# Patient Record
Sex: Female | Born: 1980 | Race: Black or African American | Hispanic: No | Marital: Single | Smoking: Former smoker
Health system: Southern US, Community
[De-identification: ages and names within clinical notes are randomized; demographics above are authoritative.]

## PROBLEM LIST (undated history)

## (undated) DIAGNOSIS — E119 Type 2 diabetes mellitus without complications: Secondary | ICD-10-CM

## (undated) DIAGNOSIS — C539 Malignant neoplasm of cervix uteri, unspecified: Secondary | ICD-10-CM

## (undated) DIAGNOSIS — K219 Gastro-esophageal reflux disease without esophagitis: Secondary | ICD-10-CM

## (undated) DIAGNOSIS — G6181 Chronic inflammatory demyelinating polyneuritis: Secondary | ICD-10-CM

## (undated) DIAGNOSIS — K449 Diaphragmatic hernia without obstruction or gangrene: Secondary | ICD-10-CM

## (undated) DIAGNOSIS — G8929 Other chronic pain: Secondary | ICD-10-CM

## (undated) DIAGNOSIS — K5790 Diverticulosis of intestine, part unspecified, without perforation or abscess without bleeding: Secondary | ICD-10-CM

## (undated) DIAGNOSIS — I639 Cerebral infarction, unspecified: Secondary | ICD-10-CM

## (undated) DIAGNOSIS — G43109 Migraine with aura, not intractable, without status migrainosus: Secondary | ICD-10-CM

## (undated) DIAGNOSIS — F6810 Factitious disorder, unspecified: Secondary | ICD-10-CM

## (undated) DIAGNOSIS — G40909 Epilepsy, unspecified, not intractable, without status epilepticus: Secondary | ICD-10-CM

## (undated) DIAGNOSIS — N83209 Unspecified ovarian cyst, unspecified side: Secondary | ICD-10-CM

## (undated) DIAGNOSIS — R569 Unspecified convulsions: Secondary | ICD-10-CM

## (undated) DIAGNOSIS — D571 Sickle-cell disease without crisis: Secondary | ICD-10-CM

## (undated) DIAGNOSIS — Z8639 Personal history of other endocrine, nutritional and metabolic disease: Secondary | ICD-10-CM

## (undated) HISTORY — PX: ROUX-EN-Y GASTRIC BYPASS: SHX1104

## (undated) HISTORY — DX: Factitious disorder imposed on self, unspecified: F68.10

## (undated) HISTORY — DX: Malignant neoplasm of cervix uteri, unspecified: C53.9

## (undated) HISTORY — DX: Diverticulosis of intestine, part unspecified, without perforation or abscess without bleeding: K57.90

## (undated) HISTORY — DX: Chronic inflammatory demyelinating polyneuritis: G61.81

## (undated) HISTORY — DX: Other chronic pain: G89.29

## (undated) HISTORY — DX: Diaphragmatic hernia without obstruction or gangrene: K44.9

## (undated) HISTORY — DX: Unspecified ovarian cyst, unspecified side: N83.209

## (undated) HISTORY — DX: Personal history of other endocrine, nutritional and metabolic disease: Z86.39

## (undated) HISTORY — DX: Migraine with aura, not intractable, without status migrainosus: G43.109

## (undated) HISTORY — DX: Gastro-esophageal reflux disease without esophagitis: K21.9

## (undated) HISTORY — DX: Epilepsy, unspecified, not intractable, without status epilepticus: G40.909

---

## 2006-09-20 HISTORY — PX: OOPHORECTOMY: SHX86

## 2008-09-20 DIAGNOSIS — Z8639 Personal history of other endocrine, nutritional and metabolic disease: Secondary | ICD-10-CM

## 2008-09-20 HISTORY — DX: Personal history of other endocrine, nutritional and metabolic disease: Z86.39

## 2010-09-20 HISTORY — PX: NISSEN FUNDOPLICATION: SHX2091

## 2012-09-20 DIAGNOSIS — G43109 Migraine with aura, not intractable, without status migrainosus: Secondary | ICD-10-CM

## 2012-09-20 HISTORY — DX: Migraine with aura, not intractable, without status migrainosus: G43.109

## 2013-09-20 DIAGNOSIS — C539 Malignant neoplasm of cervix uteri, unspecified: Secondary | ICD-10-CM

## 2013-09-20 HISTORY — DX: Malignant neoplasm of cervix uteri, unspecified: C53.9

## 2013-09-20 HISTORY — PX: ABDOMINAL HYSTERECTOMY: SHX81

## 2013-09-20 HISTORY — PX: VAGINAL HYSTERECTOMY: SUR661

## 2015-01-13 HISTORY — PX: ESOPHAGOGASTRODUODENOSCOPY: SHX1529

## 2015-12-31 ENCOUNTER — Encounter: Payer: Self-pay | Admitting: Internal Medicine

## 2015-12-31 ENCOUNTER — Ambulatory Visit (INDEPENDENT_AMBULATORY_CARE_PROVIDER_SITE_OTHER): Payer: Self-pay | Admitting: Internal Medicine

## 2015-12-31 VITALS — BP 120/72 | HR 76 | Resp 16 | Ht 60.0 in | Wt 129.0 lb

## 2015-12-31 DIAGNOSIS — R110 Nausea: Secondary | ICD-10-CM

## 2015-12-31 DIAGNOSIS — R109 Unspecified abdominal pain: Secondary | ICD-10-CM

## 2015-12-31 MED ORDER — PROMETHAZINE HCL 12.5 MG PO TABS: ORAL_TABLET | ORAL | Status: DC

## 2015-12-31 MED ORDER — TRAMADOL HCL 50 MG PO TABS: ORAL_TABLET | ORAL | Status: DC

## 2015-12-31 NOTE — Progress Notes (Signed)
Subjective:    Patient ID: Stacy Jackson, female    DOB: 03-14-1981, 35 y.o.   MRN: [id number]  HPI   New patient to establish, previously lived in [place], where her abdominal complaints started.  Stacy Jackson, Dr. Heather Jackson was her provider for this.  Intermittent right sided abdominal pain:  Problem for 18 months.  No change in frequency and severity with time.  Relates the pain to what she states are 4  tumors in her abdomen. Stats always has nausea, but at times, much worse.  Describes bruised, sore area.  Can have shooting pains into shoulder and through to back.  Reportedly, Gallbladder was not felt to be the cause of her symptoms. Has had weight loss of about 20 lbs since the abdominal pain started (pt. Actually relates she lost weight from 220 lbs starting 2010, but that was planned.)  2003 and 2008:  Cesarian Sections 2008:  Had right ovary with benign cyst removed. 0000000:  Nissen Fundoplication for Hiatal Hernia repair. March 2014, underwent TAH for cervical cancer unresponsive to LEEP.    Right upper quadrant pain began October of 2015 with decreased appetite and abdominal bloating.  Was evaluated with ultrasound and several studies.  Led to being seen by gastroenterologist, Dr. Luvenia Jackson having UGI with small bowel follow through and found to have the 4 tumors.  She is not certain where the tumors are actually located.  Her last visit with Dr. Loanne Jackson was June of 2016.   Pt. Subsequently moved from [place], [place] to [place] to be closer to family.  Did not have insurance upon moving, so has not continued the evaluation.  Eats once daily as does not have an appetite.  When eats, pain and nausea are worse.  Does not drink a lot--maybe drinks 2 bottles daily.  Took Tramadol at one point, but has not had any since August. Past Medical History  Diagnosis Date  . Hiatal hernia with gastroesophageal reflux 0000000    Fundoplication in 0000000  . Ovarian cyst right    2008  . Cervical cancer ([place]) 2015    Unsuccessful LEEP, then TAH  . History of obesity 2010  . 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 Complicated migraine [id number]    Left arm numbness, states she may have had a "mini-stroke"   Past Surgical History  Procedure Laterality Date  . Abdominal hysterectomy  2015    For cervical cancer unresponsive to LEEP  . Oophorectomy Right 2008    Done at same time of Csection  . Cesarean section  2003, 2008  . Nissen fundoplication  0000000   Social History   Social History  . Marital Status: Single    Spouse Name: N/A  . Number of Children: 2  . Years of Education: 16   Occupational History  . Operations at a golf [employer]    Social History Main Topics  . Smoking status: Never Smoker   . Smokeless tobacco: Never Used  . Alcohol Use: No  . Drug Use: No  . Sexual Activity: Not Currently   Other Topics Concern  . Not on file   Social History Narrative   Lives by herself   Children have different fathers--both involved in her children's lives   Children live in [place] with her parents.   Discusses her children needed to get set up with school when home fell through in [place], so stayed  with her parents until she could get her feet under her --eventually moved to [facility] to be closer to them [facility])      Family History  Problem Relation Age of Onset  . Renal cancer Mother   . Hypertension Mother   . Ulcerative colitis Father   . Diabetes Father   . Hypertension Father   . Diabetes Brother   . Hypertension Brother   . Asthma Brother   . Sickle cell trait Daughter   . Eczema Son   . Autism spectrum disorder Son   . Asthma Brother   . Eczema Brother         Review of Systems  Gastrointestinal: Positive for nausea and constipation. Negative for vomiting and blood in stool.       No melena   No current outpatient  prescriptions on file.  No Known Allergies    Objective:   Physical Exam  NAD HEENT:  PERRL, EOMI, TMs pearly gray, throat without injection, MMM Neck:  Supple, No adenopathy or thyromegaly Chest:  CTA CV:  RRR with normal S1 and S2, No S3, S4 or murmur, radial and DP pulses normal and equal Abd:  S, NT, No HSM or mass appreciated, +BS throughout, lot of loose skin and stretch marks across abdomen        Assessment & Plan:  1.  Abdominal pain with possible tumors:  Need records from primary care and GI specialist in [place].  Pt. Signed release.Marland Kitchen

## 2015-12-31 NOTE — Patient Instructions (Signed)
Call if you do not hear from the office in 2 weeks--to see if we have received your records

## 2016-01-01 ENCOUNTER — Telehealth: Payer: Self-pay | Admitting: Internal Medicine

## 2016-01-01 NOTE — Telephone Encounter (Signed)
Patient called today stating she was unable to get medicaton traMADol (ULTRAM) 50 MG tablet  At [facility] because they do not take the [employer]. [facility] on [address] and pharmacist states traMADol (ULTRAM) 50 MG tablet  For a quantity of 30 is $4.00. Patient informed and will go get medication

## 2016-01-24 ENCOUNTER — Encounter: Payer: Self-pay | Admitting: Internal Medicine

## 2016-01-26 ENCOUNTER — Telehealth: Payer: Self-pay | Admitting: Internal Medicine

## 2016-01-26 NOTE — Telephone Encounter (Signed)
Patient called inquiring if medical records received from Dr. Heather Roberts - [address] - [place], [place].  Patient can be reached at [phone].

## 2016-02-17 ENCOUNTER — Encounter: Payer: Self-pay | Admitting: Internal Medicine

## 2016-02-17 NOTE — Telephone Encounter (Signed)
Called patient to let her know we did receive her records from previous GI provider, but did not include CT scan ordered at her last visit in June of 2016.   Call placed into Dr. Cordelia Pen office to obtain the CT scan reading. Also, noted she had a Roux en Y surgery sometime prior to her visit with him and need that history from her as well.  Sounds like it was done as a bariatric procedure.

## 2016-02-27 NOTE — Telephone Encounter (Signed)
Records reveived

## 2016-02-27 NOTE — Telephone Encounter (Signed)
Did we ever get a reply back to receive a copy of the CT report from Dr. Cordelia Pen office?  Have not seen yet.

## 2016-03-01 ENCOUNTER — Ambulatory Visit (INDEPENDENT_AMBULATORY_CARE_PROVIDER_SITE_OTHER): Payer: Self-pay | Admitting: Internal Medicine

## 2016-03-01 ENCOUNTER — Encounter: Payer: Self-pay | Admitting: Internal Medicine

## 2016-03-01 VITALS — BP 112/62 | HR 76 | Temp 98.1°F | Resp 16 | Ht 60.0 in | Wt 124.0 lb

## 2016-03-01 DIAGNOSIS — Z8639 Personal history of other endocrine, nutritional and metabolic disease: Secondary | ICD-10-CM

## 2016-03-01 DIAGNOSIS — R1031 Right lower quadrant pain: Secondary | ICD-10-CM

## 2016-03-01 LAB — GLUCOSE, POCT (MANUAL RESULT ENTRY): POC Glucose: 56 mg/dl — AB (ref 70–99)

## 2016-03-01 MED ORDER — HYDROCODONE-ACETAMINOPHEN 5-325 MG PO TABS: ORAL_TABLET | ORAL | Status: DC

## 2016-03-01 NOTE — Progress Notes (Signed)
   Subjective:    Patient ID: Stacy Jackson, female    DOB: [birth year], 35 y.o.   MRN: [id number]  HPI   Pt. Here today to follow up on right abdominal pelvic pain.  Previously, base on her description of findings of tumors on a CT scan before she left [place] done for work up of this pain, she though the tumors were involving her digestive tract. Finally received her CT scan report last week and this actually showed calcified cystic lesions thought to be involving the left wall of her uterus, which she states was removed a year previous in 2015 for cervical cancer not responsive to LEEP.   Pt. Does relate today, something "exploded"  In her abdomen following the hysterectomy as "the suture came undone" and surgeons had to go back in to remedy this about 2 weeks after the original surgery.  We do not have these records.  An OB/Gyn, Dr. Gorden Harms in [place], [place].   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   She has also had 2 csections and a right oophorectomy due to a benign cyst with the 2nd csection in 2008. Pt denies periods since 2015 when she underwent hysterectomy and has no vaginal discharge.    Tramadol up to 100 mg at a time is not controlling her pain to sleep   Current outpatient prescriptions:  .  doxylamine, Sleep, (UNISOM) 25 MG tablet, Take 25 mg by mouth at bedtime as needed for sleep., Disp: , Rfl:  .  traMADol (ULTRAM) 50 MG tablet, 1 tab by mouth at bedtime, Disp: 30 tablet, Rfl: 1 .  HYDROcodone-acetaminophen (NORCO/VICODIN) 5-325 MG tablet, 1 tab by mouth at night for severe pain, Disp: 30 tablet, Rfl: 0 .  promethazine (PHENERGAN) 12.5 MG tablet, 1-2 tabs by mouth every 6 hours as needed for nausea (Patient not taking: Reported on 03/01/2016), Disp: 30 tablet, Rfl: 0  Hydrocodone added at end of  visit.  No Known Allergies  Review of Systems     Objective:   Physical Exam  Lungs:  CTA CV:  RRR without murmur or rub Abd:  S, nontender in area of pain on right, but is tender in LLQ.  No defined mass on palpation, + BS, No HSM       Assessment & Plan:  1.  Right lower abdominal pain with left Lower quadrant findings on an old CT.    CT scan from 03/20/2015 showing calcified cystic lesions adjacent to the left uterine wall, which is interesting as the uterus reportedly was removed in 2015.   Also, may involve left adnexa.  Referral to [facility].  Will send for Disc of CT scan from last June as well as records from Dr. Ewing Schlein office regarding her hysterectomy.  Hydrocodone 5/Tylenol 325 mg 1 tab at bedtime for pain/sleep

## 2016-03-03 ENCOUNTER — Telehealth: Payer: Self-pay

## 2016-03-03 NOTE — Telephone Encounter (Signed)
Called patient regarding women's clinic referral. Patient informed that she will be seeing Dr. Hulan Fray at 1:20pm on 04-07-2016. Patient informed she will be responsible for a 20 dollar co-pay and she will have to complete a financial assistance application because the women's outpatient clinic no longer accepts the [facility] card. Patient also informed to bring her ID and call the clinic at [phone] if she needs to reschedule.

## 2016-04-07 ENCOUNTER — Ambulatory Visit (INDEPENDENT_AMBULATORY_CARE_PROVIDER_SITE_OTHER): Payer: Self-pay | Admitting: Obstetrics & Gynecology

## 2016-04-07 ENCOUNTER — Encounter: Payer: Self-pay | Admitting: Obstetrics & Gynecology

## 2016-04-07 VITALS — BP 109/75 | HR 89 | Wt 129.3 lb

## 2016-04-07 DIAGNOSIS — R1031 Right lower quadrant pain: Secondary | ICD-10-CM

## 2016-04-07 NOTE — Progress Notes (Signed)
   Subjective:    Patient ID: Stacy Jackson, female    DOB: [birth year], 35 y.o.   MRN: [id number]  HPI 35 yo AA lady here with the complaint of RLQ pain for about a decade. She had a hysterecomy in [place] in 2014 and has her left ovary remaining in situ. She has also had extensive gastric surgery, done for weight loss several years ago.  Interestingly she had a CT done in 2016 that showed a calcification "near the wall of the uterus".   Review of Systems     Objective:   Physical Exam WNWHBFNAD Breathing, conversing normally Entirely normal exam. I cannot feel a cervix or uterus or any masses       Assessment & Plan:  RLQ pain- schedule gyn u/s

## 2016-04-13 ENCOUNTER — Ambulatory Visit (HOSPITAL_COMMUNITY)
Admission: RE | Admit: 2016-04-13 | Discharge: 2016-04-13 | Disposition: A | Discharge status: Home / Self Care | Payer: Self-pay | Source: Ambulatory Visit | Attending: Obstetrics & Gynecology | Admitting: Obstetrics & Gynecology

## 2016-04-13 DIAGNOSIS — R1031 Right lower quadrant pain: Secondary | ICD-10-CM | POA: Insufficient documentation

## 2016-04-13 DIAGNOSIS — Z90710 Acquired absence of both cervix and uterus: Secondary | ICD-10-CM | POA: Insufficient documentation

## 2016-04-13 DIAGNOSIS — N83291 Other ovarian cyst, right side: Secondary | ICD-10-CM | POA: Insufficient documentation

## 2016-04-13 IMAGING — US US PELVIS COMPLETE
1 series · 15 of 25 positions shown · non-contrast
Comparison: None

CLINICAL DATA: Right lower quadrant pain .

EXAM:
TRANSABDOMINAL AND TRANSVAGINAL ULTRASOUND OF PELVIS
TECHNIQUE: Both transabdominal and transvaginal ultrasound examinations of the
pelvis were performed. Transabdominal technique was performed for
global imaging of the pelvis including uterus, ovaries, adnexal
regions, and pelvic cul-de-sac. It was necessary to proceed with
endovaginal exam following the transabdominal exam to visualize the
uterus and ovaries.

[Series 1: us pelvis complete · 15 of 85 slices shown]
[im 1/85]
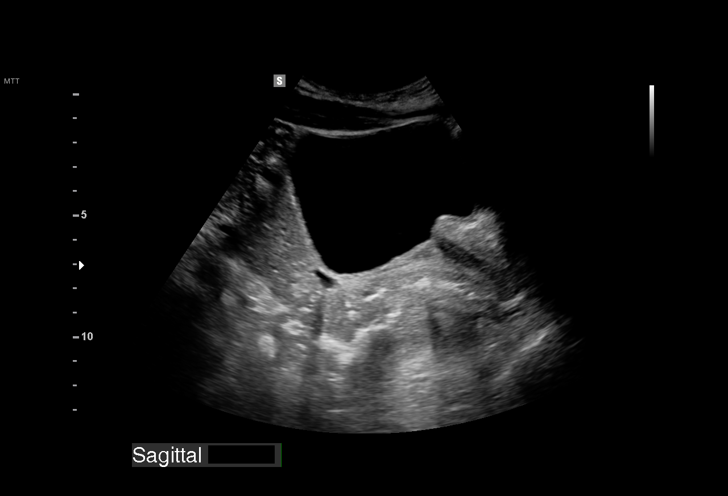
[im 8/85]
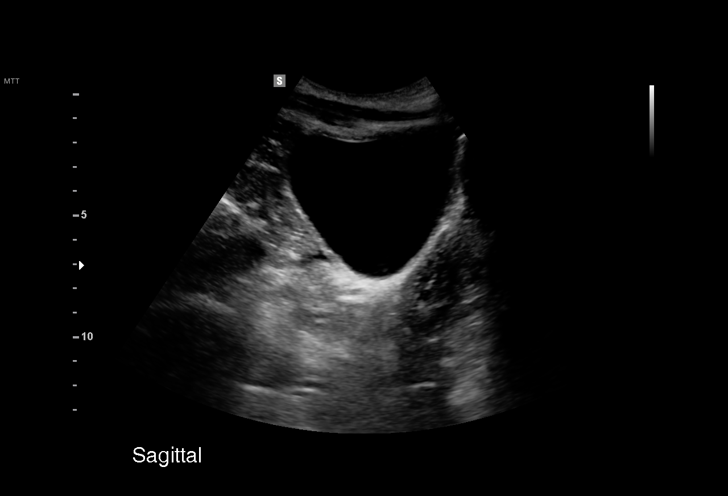
[im 15/85]
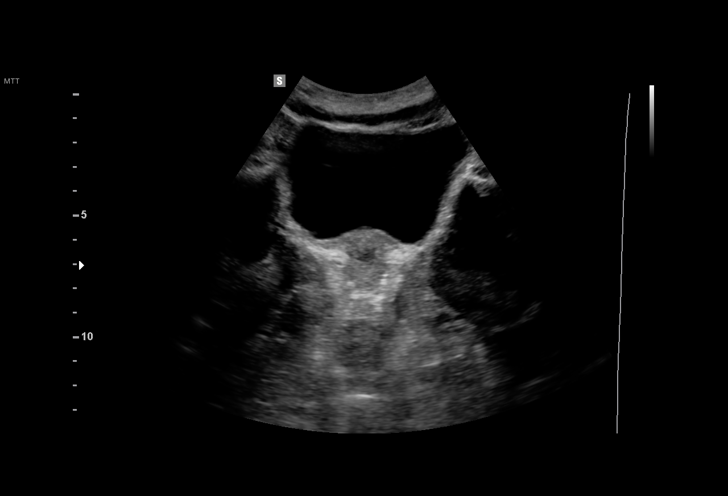
[im 18/85]
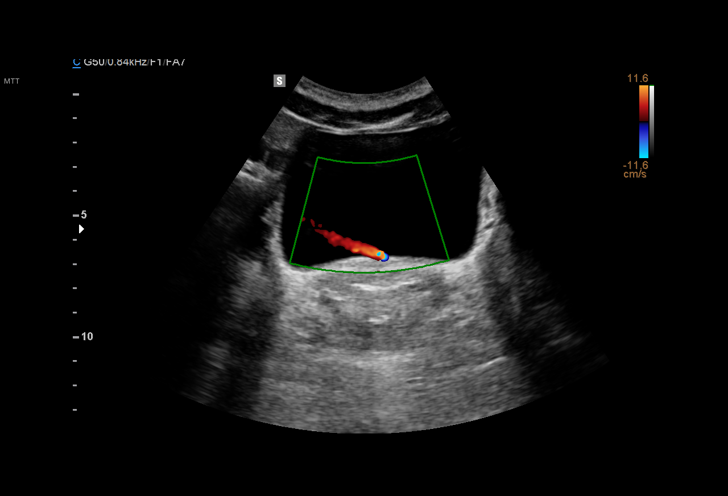
[im 25/85]
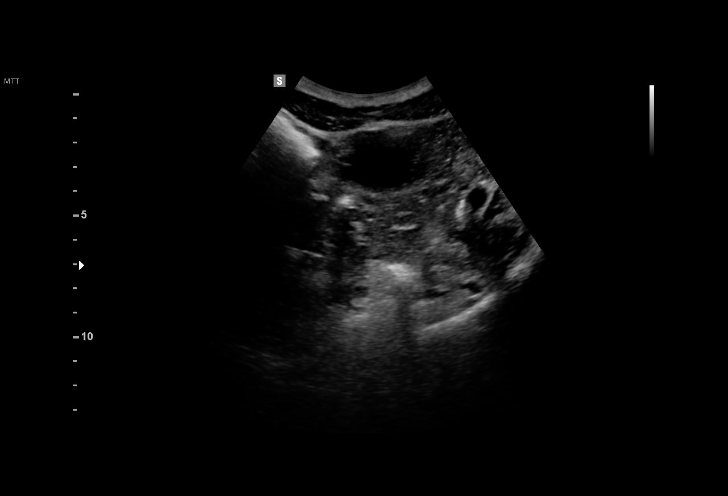
[im 32/85]
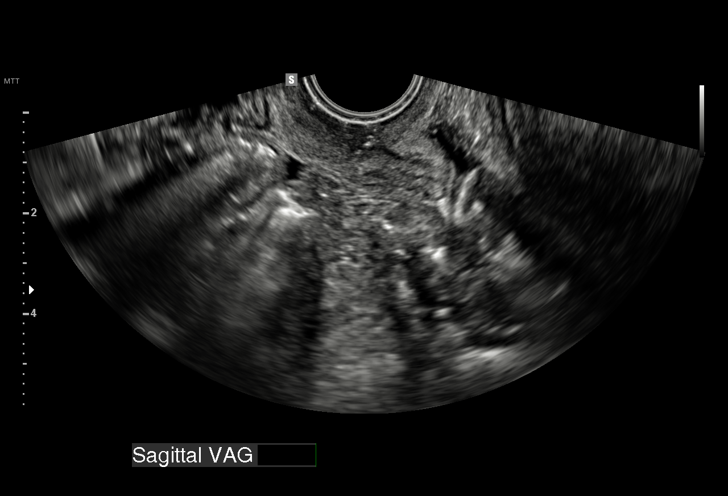
[im 36/85]
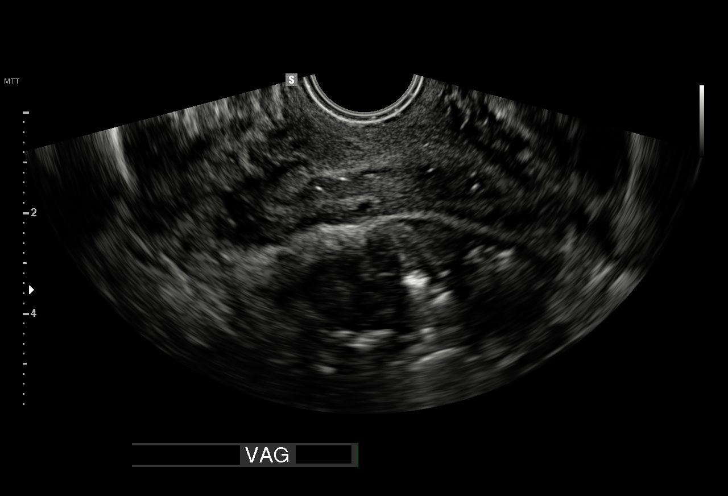
[im 43/85]
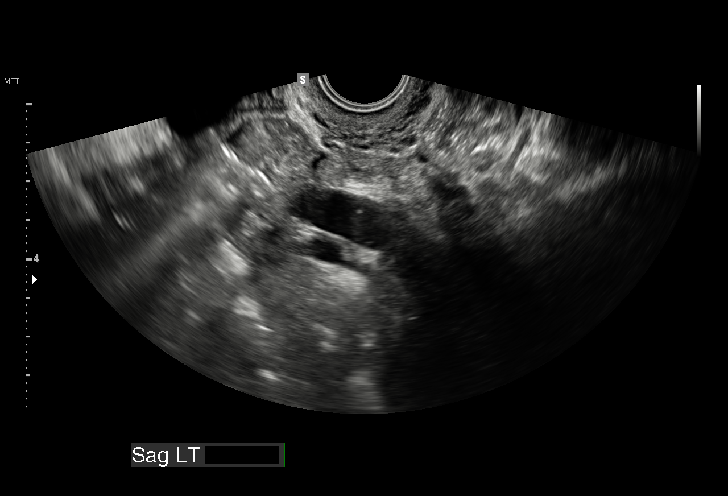
[im 50/85]
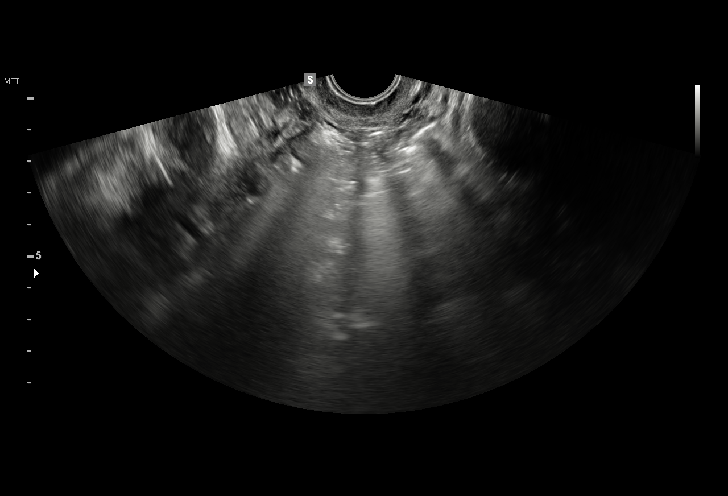
[im 53/85]
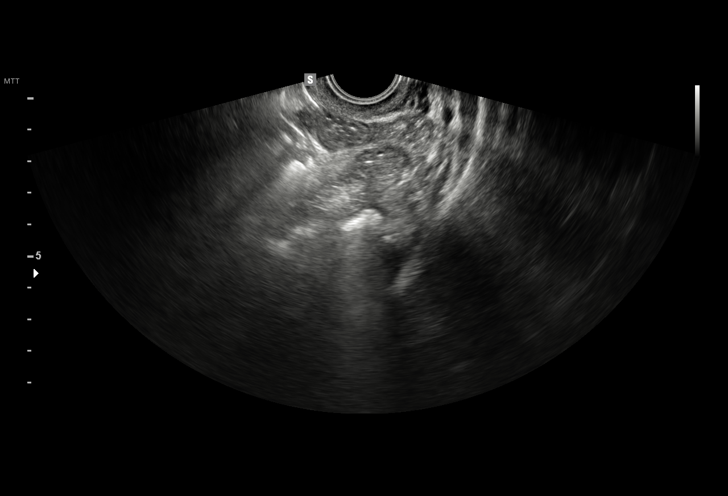
[im 60/85]
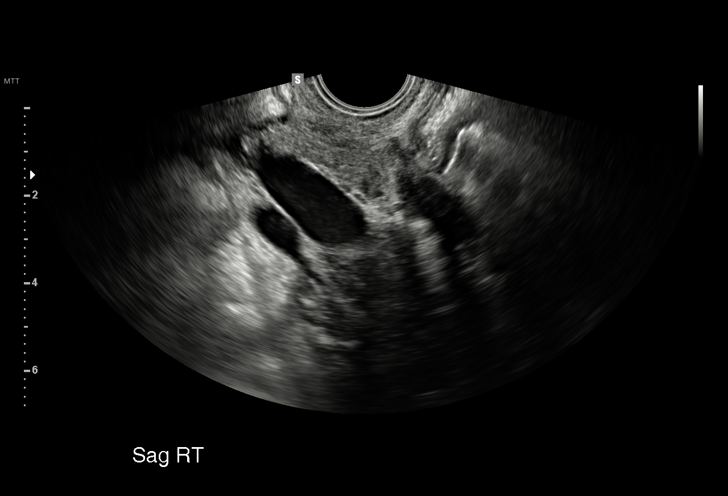
[im 67/85]
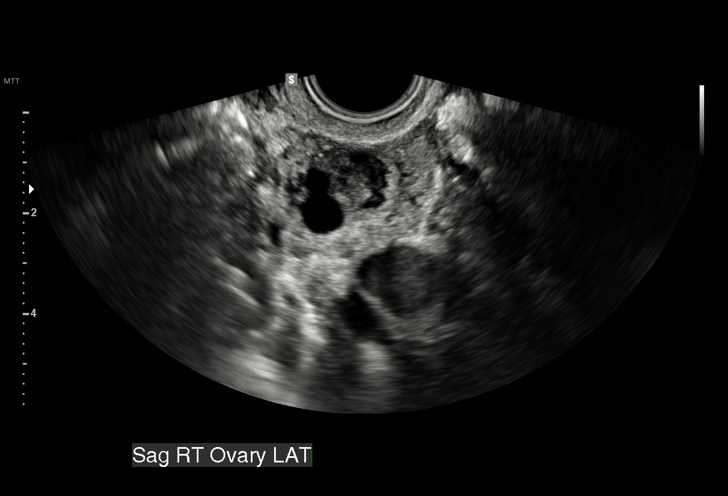
[im 71/85]
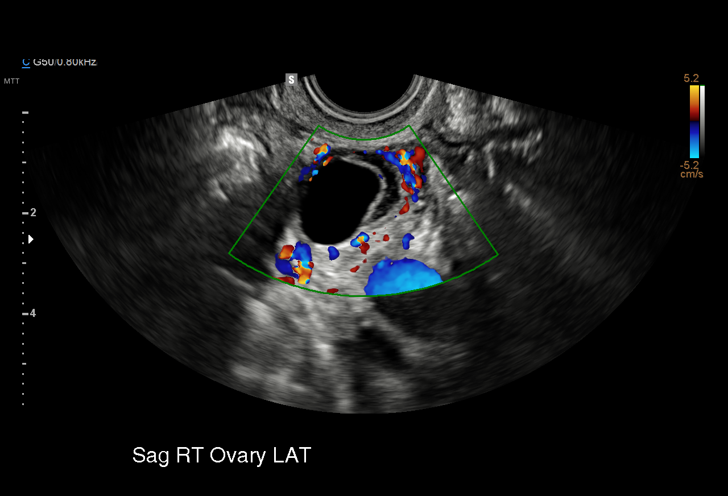
[im 78/85]
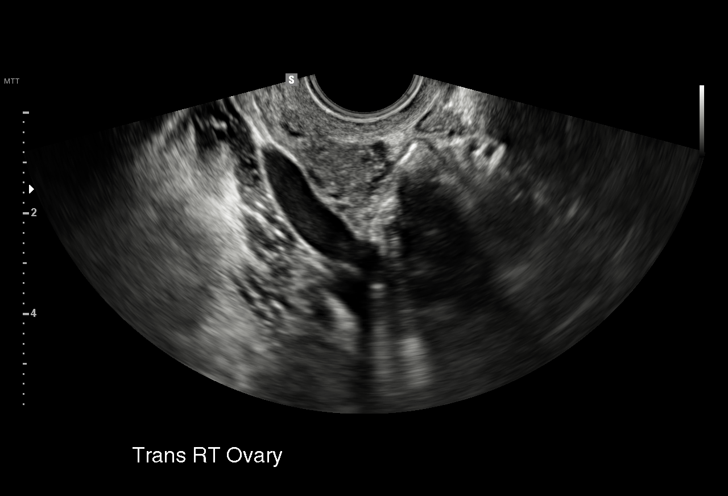
[im 85/85]
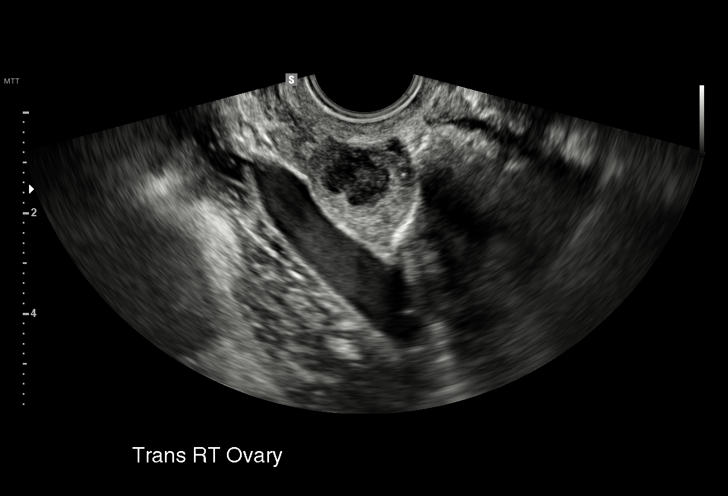

[15 of 25 positions shown; findings below may reference images not displayed]

FINDINGS: Uterus

Hysterectomy.

Right ovary

Measurements: 3.2 x 2.8 x 2.4 cm. 1 cm complex cyst right ovary,
most likely tiny hemorrhagic cyst. 1.6 cm simple cyst.

Left ovary

Not visualized.

Other findings

Trace free pelvic fluid .
IMPRESSION: 1. Hysterectomy.

2. Left ovary not visualized. Tiny 1 cm complex cyst right ovary,
most likely tiny hemorrhagic cyst. Short-interval follow up
ultrasound in 6-12 weeks is recommended, preferably during the week
following the patient's normal menses.

## 2016-04-29 ENCOUNTER — Ambulatory Visit (INDEPENDENT_AMBULATORY_CARE_PROVIDER_SITE_OTHER): Payer: Self-pay | Admitting: Obstetrics & Gynecology

## 2016-04-29 ENCOUNTER — Encounter: Payer: Self-pay | Admitting: Obstetrics & Gynecology

## 2016-04-29 VITALS — BP 106/76 | HR 78 | Ht 59.0 in | Wt 122.9 lb

## 2016-04-29 DIAGNOSIS — R102 Pelvic and perineal pain: Secondary | ICD-10-CM

## 2016-04-29 MED ORDER — IBUPROFEN 800 MG PO TABS: 800.0000 mg | ORAL_TABLET | Freq: Three times a day (TID) | ORAL | 1 refills | Status: DC | PRN

## 2016-04-29 NOTE — Progress Notes (Signed)
Informed pt that because she does not have insurance the GI office will require that she brings either $120 or $220 to her appt.  Pt stated that she will be applying for the [facility] financial assistance and will contact us when she receives approval to see if [facility] will take her as a new patient.  [facility] referral faxed, gave pt Pain Clinic information to contact them in regards to appt and informed her that getting an appt may take up to 6 months.  Pt stated understanding with no further questions.

## 2016-04-29 NOTE — Progress Notes (Signed)
   Subjective:    Patient ID: Stacy Jackson, female    DOB: 1981-02-06, 35 y.o.   MRN: [id number]  HPI 35 yo AA lady is here for follow up after her u/s done for a 10 year h/o pelvic pain. She has an extensive surgical history including a hysterectomy and LSO. The u/s was normal, did show a 1 cm complex right ovarian cyst (She declines the rec'd 3 month follow up u/s).  She reports bowel difficulties, has tried OTC stool softeners including Miralax, takes about 45 mins and position changes each morning to have a BM.   Review of Systems     Objective:   Physical Exam WNWHBFNAD Breathing, conversing, and ambulating normally She was teary when I told her that I don't see a gyn etiology of her pain.       Assessment & Plan:  Chronic pelvic pain- She is requesting " non NSAIDS" but I have explained that we don't manage chronic pain with narcotics at this clinic. Refer to pain management clinic Refer to GI

## 2016-04-29 NOTE — Addendum Note (Signed)
Addended by: Emily Filbert on: 04/29/2016 02:06 PM   Modules accepted: Orders

## 2016-06-16 ENCOUNTER — Encounter: Payer: Self-pay | Admitting: Internal Medicine

## 2016-06-16 ENCOUNTER — Ambulatory Visit (INDEPENDENT_AMBULATORY_CARE_PROVIDER_SITE_OTHER): Payer: Self-pay | Admitting: Internal Medicine

## 2016-06-16 VITALS — BP 112/78 | HR 82 | Resp 16 | Ht 60.0 in | Wt 127.0 lb

## 2016-06-16 DIAGNOSIS — R1031 Right lower quadrant pain: Secondary | ICD-10-CM

## 2016-06-16 DIAGNOSIS — K219 Gastro-esophageal reflux disease without esophagitis: Secondary | ICD-10-CM

## 2016-06-16 MED ORDER — MINERAL OIL PO OIL: 30.0000 mL | TOPICAL_OIL | Freq: Once | ORAL | Status: AC

## 2016-06-16 MED ORDER — FAMOTIDINE 20 MG PO TABS: ORAL_TABLET | ORAL | 6 refills | Status: DC

## 2016-06-16 MED ORDER — GABAPENTIN 100 MG PO CAPS: ORAL_CAPSULE | ORAL | 11 refills | Status: DC

## 2016-06-16 MED ORDER — METHYLCELLULOSE (LAXATIVE) PO POWD: ORAL | Status: DC

## 2016-06-16 MED ORDER — HYDROCODONE-ACETAMINOPHEN 5-325 MG PO TABS: ORAL_TABLET | ORAL | 0 refills | Status: DC

## 2016-06-16 NOTE — Patient Instructions (Signed)
Stop Ibuprofen Elevated head of bed at night when sleeping.

## 2016-06-16 NOTE — Progress Notes (Signed)
   Subjective:    Patient ID: Stacy Jackson, female    DOB: [birth year], 35 y.o.   MRN: [id number]  HPI   1.  Right lower abdomen/pelvic discomfort:  Assessed by [facility]/Dr. Dove.  Reportedly with adhesions by patient's report with her extensive surgery, but this is not noted in Dr. Alease Medina note, though sounds likely. She was referred to pain clinic and GI.  Could not afford the pain clinic. Pt. Also with difficulty having BM every day.  Takes an hour with position changes to have a soft formed BM every day. Has tried Miralax twice daily without much improvement. Mineral Oil she thinks 30 ml once daily helped a small amount. Did use fiber laxatives as well--Citrucel.  Did not help.   Correctol did not help. Has not ever tried gabapentin for pain.  Discussed narcotics could actually make things worse.  Was using hydrocodone 2.5 mg every 3rd day just to keep the pain down to low roar. No history of sexual abuse in the past.  In past 6 months, also having sense of spasm or "flap"  Closing down in epigastric, low mid chest when drinks fluids.  Started with soda and then juices and now water.  Does have some burning in chest at times as well.  Is taking Ibuprofen 800 mg twice daily, but could stop as she does not feel it helps.   Current Meds  Medication Sig  . ibuprofen (ADVIL,MOTRIN) 800 MG tablet Take 1 tablet (800 mg total) by mouth every 8 (eight) hours as needed.     Allergies  Allergen Reactions  . Shrimp [Shellfish Allergy] Anaphylaxis        Review of Systems     Objective:   Physical Exam  NAD Lungs:  CTA CV:  RRR without murmur or rub, radial pulses normal and equal Abd:  + BS, No HSM or mass, no epigastric tenderness.  Mild to moderate tenderness in RLQ, no rebound or peritoneal signs.       Assessment & Plan:  1.  Chronic right lower quadrant pain:  GI referral.  Pain referral through [facility] sure that is available with financial assistance Try  Gabapentin for pain control 1 Rx for hydrocodone 2 months for followup  2.  ?GERD:  Famotidine 40 mg at bedtime.  Discussed GERD precautions. 1-2 weeks for fasting labs:  FLP, CBC, CMP

## 2016-06-24 ENCOUNTER — Other Ambulatory Visit (INDEPENDENT_AMBULATORY_CARE_PROVIDER_SITE_OTHER): Payer: BLUE CROSS/BLUE SHIELD

## 2016-06-24 DIAGNOSIS — R102 Pelvic and perineal pain: Secondary | ICD-10-CM

## 2016-06-24 DIAGNOSIS — G8929 Other chronic pain: Secondary | ICD-10-CM

## 2016-06-24 DIAGNOSIS — Z13220 Encounter for screening for lipoid disorders: Secondary | ICD-10-CM

## 2016-06-24 NOTE — Progress Notes (Signed)
Here for fasting labs. 

## 2016-06-25 LAB — COMPREHENSIVE METABOLIC PANEL
A/G RATIO: 2 (ref 1.2–2.2)
ALT: 11 IU/L (ref 0–32)
AST: 15 IU/L (ref 0–40)
Albumin: 4.1 g/dL (ref 3.5–5.5)
Alkaline Phosphatase: 67 IU/L (ref 39–117)
BUN/Creatinine Ratio: 17 (ref 9–23)
BUN: 11 mg/dL (ref 6–20)
Bilirubin Total: 0.3 mg/dL (ref 0.0–1.2)
CALCIUM: 8.8 mg/dL (ref 8.7–10.2)
CO2: 25 mmol/L (ref 18–29)
Chloride: 103 mmol/L (ref 96–106)
Creatinine, Ser: 0.65 mg/dL (ref 0.57–1.00)
GFR, EST AFRICAN AMERICAN: 134 mL/min/{1.73_m2} (ref >59)
GFR, EST NON AFRICAN AMERICAN: 116 mL/min/{1.73_m2} (ref >59)
GLOBULIN, TOTAL: 2.1 g/dL (ref 1.5–4.5)
Glucose: 79 mg/dL (ref 65–99)
POTASSIUM: 4.4 mmol/L (ref 3.5–5.2)
SODIUM: 141 mmol/L (ref 134–144)
Total Protein: 6.2 g/dL (ref 6.0–8.5)

## 2016-06-25 LAB — CBC WITH DIFFERENTIAL/PLATELET
BASOS: 0 %
Basophils Absolute: 0 10*3/uL (ref 0.0–0.2)
EOS (ABSOLUTE): 0.1 10*3/uL (ref 0.0–0.4)
EOS: 1 %
Hematocrit: 29.1 % — ABNORMAL LOW (ref 34.0–46.6)
Hemoglobin: 9.3 g/dL — ABNORMAL LOW (ref 11.1–15.9)
IMMATURE GRANULOCYTES: 0 %
Immature Grans (Abs): 0 10*3/uL (ref 0.0–0.1)
LYMPHS ABS: 1.7 10*3/uL (ref 0.7–3.1)
Lymphs: 32 %
MCH: 24.1 pg — ABNORMAL LOW (ref 26.6–33.0)
MCHC: 32 g/dL (ref 31.5–35.7)
MCV: 75 fL — AB (ref 79–97)
MONOS ABS: 0.4 10*3/uL (ref 0.1–0.9)
Monocytes: 8 %
NEUTROS ABS: 3 10*3/uL (ref 1.4–7.0)
Neutrophils: 59 %
Platelets: 348 10*3/uL (ref 150–379)
RBC: 3.86 x10E6/uL (ref 3.77–5.28)
RDW: 17 % — AB (ref 12.3–15.4)
WBC: 5.1 10*3/uL (ref 3.4–10.8)

## 2016-06-25 LAB — LIPID PANEL W/O CHOL/HDL RATIO
CHOLESTEROL TOTAL: 167 mg/dL (ref 100–199)
HDL: 79 mg/dL (ref >39)
LDL Calculated: 81 mg/dL (ref 0–99)
TRIGLYCERIDES: 33 mg/dL (ref 0–149)
VLDL Cholesterol Cal: 7 mg/dL (ref 5–40)

## 2016-07-16 ENCOUNTER — Telehealth: Payer: Self-pay | Admitting: Internal Medicine

## 2016-07-16 NOTE — Telephone Encounter (Signed)
Patient called requesting medication refill for HYDROcodone-acetaminophen (NORCO/VICODIN) 5-325 MG tablet  And Tramadol 50 MG tablet  Patient states she is out of pills and wants Rx to be sent to [facility] on [address]

## 2016-07-20 MED ORDER — TRAMADOL HCL 50 MG PO TABS: 50.0000 mg | ORAL_TABLET | Freq: Three times a day (TID) | ORAL | 0 refills | Status: DC | PRN

## 2016-07-21 NOTE — Telephone Encounter (Signed)
Patient at clinic yesterday.  Discussed did not want to continue with hydrocodone.  That is just short term.  Did fill  Tramadol, but discussed would gradually move away from this pain control

## 2016-07-24 ENCOUNTER — Encounter: Payer: Self-pay | Admitting: Internal Medicine

## 2016-08-17 ENCOUNTER — Ambulatory Visit (INDEPENDENT_AMBULATORY_CARE_PROVIDER_SITE_OTHER): Payer: Self-pay | Admitting: Internal Medicine

## 2016-08-17 ENCOUNTER — Encounter: Payer: Self-pay | Admitting: Internal Medicine

## 2016-08-17 VITALS — BP 110/76 | HR 84 | Resp 14 | Ht 59.0 in | Wt 124.0 lb

## 2016-08-17 DIAGNOSIS — K219 Gastro-esophageal reflux disease without esophagitis: Secondary | ICD-10-CM

## 2016-08-17 DIAGNOSIS — D509 Iron deficiency anemia, unspecified: Secondary | ICD-10-CM

## 2016-08-17 DIAGNOSIS — Z23 Encounter for immunization: Secondary | ICD-10-CM

## 2016-08-17 DIAGNOSIS — R102 Pelvic and perineal pain: Secondary | ICD-10-CM

## 2016-08-17 DIAGNOSIS — Z8639 Personal history of other endocrine, nutritional and metabolic disease: Secondary | ICD-10-CM

## 2016-08-17 DIAGNOSIS — G8929 Other chronic pain: Secondary | ICD-10-CM

## 2016-08-17 DIAGNOSIS — D649 Anemia, unspecified: Secondary | ICD-10-CM | POA: Insufficient documentation

## 2016-08-17 LAB — GLUCOSE, POCT (MANUAL RESULT ENTRY): POC GLUCOSE: 112 mg/dL — AB (ref 70–99)

## 2016-08-17 MED ORDER — GABAPENTIN 100 MG PO CAPS: ORAL_CAPSULE | ORAL | 11 refills | Status: DC

## 2016-08-17 NOTE — Progress Notes (Signed)
   Subjective:    Patient ID: Stacy Jackson, female    DOB: [birth year], 35 y.o.   MRN: [id number]  HPI   1.  Possible GERD/ Esophageal spasm:  States took Famotidine only for 2 weeks.  Did not notice any difference in symptoms.  Discussed not for the RLQ pain, this was for the symptoms of burning in epigastrium and chest and sense of "flap"  Closing down with swallowing liquids.   She does state burning in chest is better.    2.  RLQ pain:  Nausea is better with gabapentin.  Has increased to  400 mg at bedtime.  She cannot really say the pain is much better. Asking for hydrocodone.  Discussed would like to see if Gabapentin controls pain better.  3.  Insomnia:  In between a 2nd -3rd shift--gets home between 3-4 a.m. Gets max 2 hours of sleep and then carting kids to school.  Sleeps and then has to go pick up kids.  Never more than 3-4 hours or so at a time.  Goes to work at  6 p.m.  4.  Hot flashes/sweats:  Had unilateral oophorectomy and hysterectomy about 3 years ago and symptoms since then, but more intense past month.   Has tried [place] in past, but no soy recently. Maternal grandmother with breast cancer at age 69 yo, which was cause of death.  Current Meds  Medication Sig  . FERROUS GLUCONATE PO Take 325 mg by mouth 1 day or 1 dose.  . gabapentin (NEURONTIN) 100 MG capsule 1 cap by mouth at bedtime, may increase by 1 cap daily every 3 days to max of 300 mg at bedtime. (Patient taking differently: 4 at 9 pm daily.)  . traMADol (ULTRAM) 50 MG tablet Take 1 tablet (50 mg total) by mouth every 8 (eight) hours as needed.    Allergies  Allergen Reactions  . Shrimp [Shellfish Allergy] Anaphylaxis    Review of Systems     Objective:   Physical Exam Lungs:  CTA CV:  RRR without murmur or rub, radial pulses normal and equal Abd:  S, + BS, mild epigastric and right mid abdominal tenderness, no rebound, peritoneal signs, no HSM or Imass       Assessment & Plan:  1.  GERD:  Has  had extensive abdominal surgery for weight loss, including nissen fundoplication.  To take Famotidine regularly for 2 months, elevate HOB and avoid foods that cause symptoms.  Went over reflux precautions.  2.  Right abdominal pain:  Will fill Tramadol as needed, but not Hydrocodone.  Titrate up morning dose of Gabapentin to 300 mg Encouraged pt. To find a different fiber laxative and start 1/4 dose and titrate up as well.  3. Insomnia:  Do not see way of significantly improving this if cannot change schedule or find someone to help out with child school pick up.  Encouraged reading when unable to sleep.  Find someone in neighborhood to walk with outside daily  4.  Menopausal symptoms:  Discussed ERT risk with her grandmother's history of breast cancer at a young age.   She will try adding soy to diet or go to [facility] for supplements.   If no improvement, consider SSRI  5 .  HM:  Flu vaccine today  6.  Anemia:  Taking iron for 6 weeks--check CBC

## 2016-08-17 NOTE — Patient Instructions (Addendum)
For Gabapentin:  Take 1 cap in morning for 3 days, if no problems, increase to 2 caps, wait 3 days and if no problems, increase to 3 caps.   Call clinic in 1 month, let me know if you are/are not doing better with increased Gabapentin and we can decide if should increase dose further  Try a different fiber laxative--start low dose and gradually increase to a regular dose

## 2016-08-18 ENCOUNTER — Telehealth: Payer: Self-pay | Admitting: Internal Medicine

## 2016-08-18 LAB — CBC WITH DIFFERENTIAL/PLATELET
BASOS ABS: 0 10*3/uL (ref 0.0–0.2)
Basos: 0 %
EOS (ABSOLUTE): 0 10*3/uL (ref 0.0–0.4)
Eos: 1 %
HEMOGLOBIN: 10.5 g/dL — AB (ref 11.1–15.9)
Hematocrit: 34.2 % (ref 34.0–46.6)
IMMATURE GRANS (ABS): 0 10*3/uL (ref 0.0–0.1)
IMMATURE GRANULOCYTES: 0 %
LYMPHS: 29 %
Lymphocytes Absolute: 1.9 10*3/uL (ref 0.7–3.1)
MCH: 24.2 pg — ABNORMAL LOW (ref 26.6–33.0)
MCHC: 30.7 g/dL — ABNORMAL LOW (ref 31.5–35.7)
MCV: 79 fL (ref 79–97)
MONOCYTES: 9 %
Monocytes Absolute: 0.6 10*3/uL (ref 0.1–0.9)
NEUTROS ABS: 4.1 10*3/uL (ref 1.4–7.0)
NEUTROS PCT: 61 %
PLATELETS: 348 10*3/uL (ref 150–379)
RBC: 4.33 x10E6/uL (ref 3.77–5.28)
RDW: 17.1 % — ABNORMAL HIGH (ref 12.3–15.4)
WBC: 6.7 10*3/uL (ref 3.4–10.8)

## 2016-08-18 MED ORDER — GABAPENTIN 100 MG PO CAPS: ORAL_CAPSULE | ORAL | 11 refills | Status: DC

## 2016-08-18 NOTE — Progress Notes (Signed)
Spoke with patient. Discussed lab results

## 2016-08-18 NOTE — Progress Notes (Signed)
Lab appointment scheduled for 10/19/16 @ 9 am.

## 2016-08-18 NOTE — Addendum Note (Signed)
Addended by: Marcelino Duster on: 08/18/2016 12:42 PM   Modules accepted: Orders

## 2016-08-18 NOTE — Telephone Encounter (Signed)
Spoke with [employer]. Rx sent in error. Rx was faxed to correct pharmacy

## 2016-08-18 NOTE — Telephone Encounter (Signed)
Cathy from [facility] called to obtain clarification on Rx.  Pat routed called to nurse.

## 2016-09-07 ENCOUNTER — Encounter: Payer: Self-pay | Admitting: Internal Medicine

## 2016-09-28 ENCOUNTER — Telehealth: Payer: Self-pay | Admitting: Internal Medicine

## 2016-09-28 NOTE — Telephone Encounter (Signed)
Patient called to inquire about referral to Dr. Paulita Fujita.  Patient stated that Dr. Paulita Fujita needs patient encounter information.    Fraser Din could not find information in patient's chart.  Patient wants follow up.

## 2016-09-30 NOTE — Telephone Encounter (Signed)
Left message for patient to call back with details. Need more information.

## 2016-10-05 ENCOUNTER — Ambulatory Visit (INDEPENDENT_AMBULATORY_CARE_PROVIDER_SITE_OTHER): Payer: Self-pay | Admitting: Internal Medicine

## 2016-10-05 ENCOUNTER — Encounter: Payer: Self-pay | Admitting: Internal Medicine

## 2016-10-05 VITALS — BP 102/60 | HR 64 | Resp 12 | Ht 62.0 in | Wt 130.0 lb

## 2016-10-05 DIAGNOSIS — K219 Gastro-esophageal reflux disease without esophagitis: Secondary | ICD-10-CM

## 2016-10-05 DIAGNOSIS — J0100 Acute maxillary sinusitis, unspecified: Secondary | ICD-10-CM

## 2016-10-05 DIAGNOSIS — Z8639 Personal history of other endocrine, nutritional and metabolic disease: Secondary | ICD-10-CM

## 2016-10-05 DIAGNOSIS — G8929 Other chronic pain: Secondary | ICD-10-CM

## 2016-10-05 DIAGNOSIS — R102 Pelvic and perineal pain: Secondary | ICD-10-CM

## 2016-10-05 LAB — GLUCOSE, POCT (MANUAL RESULT ENTRY): POC GLUCOSE: 103 mg/dL — AB (ref 70–99)

## 2016-10-05 MED ORDER — TRAMADOL HCL 50 MG PO TABS: ORAL_TABLET | ORAL | 2 refills | Status: DC

## 2016-10-05 MED ORDER — AZITHROMYCIN 250 MG PO TABS: ORAL_TABLET | ORAL | 0 refills | Status: DC

## 2016-10-05 MED ORDER — FAMOTIDINE 20 MG PO TABS: ORAL_TABLET | ORAL | 2 refills | Status: DC

## 2016-10-05 NOTE — Progress Notes (Signed)
Subjective:    Patient ID: Stacy Jackson, female    DOB: 03-11-1981, 36 y.o.   MRN: [id number]  HPI   1.  GERD:  Again, history of Nissen fundoplication in past but patient with burning into chest and sense of flap "closing"  When swallowing food and liquid.  That is much better now that she is taking Famotidine 120 mg  daily.  Sense of "flap" closing only if drinks something too fast.  Burning resolved.  Has 3 refills.   Has been taking 3-5 tabs of 20 mg Famotidine at a time I find out later in discussion.  States she mixed up her medication dosing of Famotidine with directions for titration of Gabapentin.   Continued higher dose as it seems to make a difference with her symptoms. Discussed should not take above the dose prescribed.   2.  Right low abdominal pain:  Unable to titrate Gabapentin to 300 mg in the morning and 400 mg at night.  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  Pain is really no better.   Is taking fiber laxative. Is having significant pain to the point she feels she cannot function. Was seen by Dr. Paulita Fujita, GI on 09/06/2016, but reportedly nothing done at that visit as her records sent were not found. So, to regroup, her history with her right lower abdominal pain is as follows:   Intermittent right lower abdominal pain for a little over 2 years now (started 06/2014).   When she first established here 4.2017, she related the pain to tumors she was told she had from a work up in [place].   The pain has not changed with time in frequency or severity. Described bruised sore area in RLQ with intermittent shooting pains into shoulder and through to back.   She had been told her gallbladder was not the problem She is always nauseated, even without the pain. She had had a 20 lbs weight loss since the pain started. She eats once daily as does not have an appetite and pain and  nausea are worse after eating. Her evaluation in [place] by a Dr. Heather Roberts, gastroenterologist, reportedly included abdominal ultrasound, UGI with small bowel follow through, which reportedly showed 4 tumors.   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  Patient later shared that something "exploded" in her abdomen following the hysterectomy as the "suture came undone"  And she required more surgery to remedy this about 2 weeks after the original surgery.  We have not been able to get these records. Last visit with Dr. Loanne Drilling was 02/2015 and moved to [facility] subsequently. We ultimately were able to obtain a CT scan from 2016 just before she left [place] that reported calcified cystic lesions thought to be involving the left wall of her uterus, which was removed the year previous. With this odd information, we referred Stacy Jackson to Gynecology at [facility] to clarify any abnormality of  the left pelvic area thinking that she did still have her left ovary.  On ultrasound, she was found to have a tiny 1 cm right ovarian cyst (and thus a right ovary) and no left ovary was seen.  She does not have a uterus. She was referred to GI first through the [place] [facility], but patient could not afford, so sent via [facility] card from our clinic subsequently. We have discussed the likelihood of adhesions as her main problem with all of the abdominal surgical procedures and complications. The pain, however, continues to significantly affect her ability to function   3.  Cough  productive of clear to green brown mucous and congestion for 3 weeks.  Poor energy.  No fever.  Nose is dry.  No definite shortness of breath. Some posterior pharyngeal drainage.  Ears and eyes hurt.  Some facial pressure.  Current Meds  Medication Sig  . famotidine (PEPCID) 20 MG tablet 2 tabs by mouth 1 hour before breakfast daily (Patient taking differently: 3 tabs by mouth 1 hour before breakfast daily)  . FERROUS GLUCONATE PO Take 325 mg by mouth 1 day or 1 dose.  . gabapentin (NEURONTIN) 100 MG capsule 3 caps by mouth in the morning and  4 caps by mouth at bedtime (Patient taking differently: 1 cap by mouth in the morning and 2 caps by mouth at bedtime)   Allergies  Allergen Reactions  . Shrimp [Shellfish Allergy] Anaphylaxis        Review of Systems     Objective:   Physical Exam  NAD HEENT:  PERRL, EOMI, TMs pearly gray, throat without injection, Nasal mucosa swollen and red, tender over bilateral maxillary sinuses. Neck:  Supple, NO adenopathy Chest:  CTA CV:  RRR without murmur or rub Abd:  S, + BS, No HSM or mass, tender mainly in RLQ-mild        Assessment & Plan:  1.  Acute Sinusitis:  Z pack, nasal saline.  Decongestants  2.  Chronic mainly Right lower abdominal/pelvic pain.  Not clear what happened with GI referral.  Have asked [employer] card if info sent with previous referral. They will make sure it does get sent.  Unable to titrate up with gabapentin due to sleep schedule. Discussed again would not treat with any more potent opioid than Tramadol.   Will try GI referral again and have outlined patients history as known in this note.  3.  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  Will treat with 60 mg of Famotidine.  Seems to be helping.

## 2016-10-19 ENCOUNTER — Other Ambulatory Visit (INDEPENDENT_AMBULATORY_CARE_PROVIDER_SITE_OTHER): Payer: BLUE CROSS/BLUE SHIELD

## 2016-10-19 DIAGNOSIS — D509 Iron deficiency anemia, unspecified: Secondary | ICD-10-CM

## 2016-10-20 LAB — CBC WITH DIFFERENTIAL/PLATELET
BASOS ABS: 0 10*3/uL (ref 0.0–0.2)
Basos: 0 %
EOS (ABSOLUTE): 0.1 10*3/uL (ref 0.0–0.4)
Eos: 1 %
HEMOGLOBIN: 10.5 g/dL — AB (ref 11.1–15.9)
Hematocrit: 32.9 % — ABNORMAL LOW (ref 34.0–46.6)
IMMATURE GRANS (ABS): 0 10*3/uL (ref 0.0–0.1)
IMMATURE GRANULOCYTES: 0 %
LYMPHS: 36 %
Lymphocytes Absolute: 2.2 10*3/uL (ref 0.7–3.1)
MCH: 24.9 pg — ABNORMAL LOW (ref 26.6–33.0)
MCHC: 31.9 g/dL (ref 31.5–35.7)
MCV: 78 fL — ABNORMAL LOW (ref 79–97)
MONOCYTES: 9 %
Monocytes Absolute: 0.5 10*3/uL (ref 0.1–0.9)
NEUTROS ABS: 3.3 10*3/uL (ref 1.4–7.0)
NEUTROS PCT: 54 %
Platelets: 331 10*3/uL (ref 150–379)
RBC: 4.22 x10E6/uL (ref 3.77–5.28)
RDW: 16.8 % — ABNORMAL HIGH (ref 12.3–15.4)
WBC: 6.1 10*3/uL (ref 3.4–10.8)

## 2016-11-12 NOTE — Progress Notes (Signed)
Labs discussed with patient

## 2016-11-16 ENCOUNTER — Encounter: Payer: Self-pay | Admitting: Internal Medicine

## 2016-11-16 ENCOUNTER — Ambulatory Visit (INDEPENDENT_AMBULATORY_CARE_PROVIDER_SITE_OTHER): Payer: Self-pay | Admitting: Internal Medicine

## 2016-11-16 VITALS — BP 112/72 | HR 72 | Resp 12 | Ht 60.0 in | Wt 126.0 lb

## 2016-11-16 DIAGNOSIS — K219 Gastro-esophageal reflux disease without esophagitis: Secondary | ICD-10-CM

## 2016-11-16 DIAGNOSIS — G8929 Other chronic pain: Secondary | ICD-10-CM

## 2016-11-16 DIAGNOSIS — R102 Pelvic and perineal pain: Secondary | ICD-10-CM

## 2016-11-16 DIAGNOSIS — K921 Melena: Secondary | ICD-10-CM | POA: Insufficient documentation

## 2016-11-16 DIAGNOSIS — D509 Iron deficiency anemia, unspecified: Secondary | ICD-10-CM

## 2016-11-16 LAB — POCT HEMOGLOBIN: Hemoglobin: 9.6 g/dL — AB (ref 12.2–16.2)

## 2016-11-16 NOTE — Progress Notes (Signed)
   Subjective:    Patient ID: Grabriela Jackson, female    DOB: 1980-11-12, 36 y.o.   MRN: :[id number]  HPI   1.  RLQ pain:  Has been seen by Dr. Paulita Fujita, GI.  Waiting for report from [place] regarding the CT scan, which we had a hard time obtaining and for some reason, is not scanned into her media tab. She is to undergo colonoscopy on March 5th.   She is now stating she has had blood in her stool since the beginning of the year maybe once weekly.  Was initially bright red, perhaps mixed in with stool and would have on tissue after cleaning.  Since she was last seen, she has developed an occasional stool that has darker blood associated with it, again once weekly. States not able to take the Tramadol, seems to be making her sick; cannot keep down.  2.  She has had iron deficient anemia as well Continues on iron supplementation.   Taking once daily.  3.  GERD symptoms:  Taking Famotidine 40 mg daily.  Sometimes forgets to take.  Sense of "flap"  Closing down in her throat.  Now only has this every other week.  Only lasts for a couple of minutes and resolves--like her food she is swallowing has to "pass through"  Current Meds  Medication Sig  . famotidine (PEPCID) 20 MG tablet 3 tabs by mouth 1 hour before breakfast daily  . FERROUS GLUCONATE PO Take 325 mg by mouth 1 day or 1 dose.  . gabapentin (NEURONTIN) 100 MG capsule 3 caps by mouth in the morning and  4 caps by mouth at bedtime (Patient taking differently: 1 cap by mouth in the morning and 2 caps by mouth at bedtime)   Allergies  Allergen Reactions  . Shrimp [Shellfish Allergy] Anaphylaxis    Review of Systems     Objective:   Physical Exam  NAD Lungs:  CTA CV:  RRR without murmur or rub Abd:  S, mild tenderness with guarding RLQ, Lots of BS-flowing noise, No high pitched gas noises.  No HSM or mass.        Assessment & Plan:  1.  RLQ pain:  Discussed would not prescribe long term narcotics for the pain and would be  concerned for more problems should use of narcotics cause constipation. Await colonoscopy results. If no concerning findings, will need to discuss with Dr. Paulita Fujita if research being pursued regarding chronic pain for adhesions in a patient with history of multiple abdominal surgeries and what sounds like a history of diffuse peritonitis at one point. Not really able to see how she would do with increase of gabapentin for sleep and pain as she only gets 2 hours of sleep at a time with her work and day schedule. Long discussion regarding restorative sleep to deal with chronic pain.  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  Her 36 yo is responsible and could watch the younger ones once home. This would allow her a full night of possible sleep hours.  2.  Relatively new history of possibly hematochezia.  As above  3.  Iron deficiency anemia: Fingerstick hemoglobin today at 9.6, down a bit.  States she already switched to twice daily Ferrous gluconate.  4.  GERD:  Better, but not taking H2 blocker consistently.  Encouraged her to do so.

## 2016-11-19 ENCOUNTER — Other Ambulatory Visit: Payer: Self-pay | Admitting: Gastroenterology

## 2016-11-22 ENCOUNTER — Ambulatory Visit (HOSPITAL_COMMUNITY)
Admission: RE | Admit: 2016-11-22 | Discharge: 2016-11-22 | Disposition: A | Discharge status: Home / Self Care | Payer: BLUE CROSS/BLUE SHIELD | Source: Ambulatory Visit | Attending: Gastroenterology | Admitting: Gastroenterology

## 2016-11-22 ENCOUNTER — Encounter (HOSPITAL_COMMUNITY)
Admission: RE | Disposition: A | Discharge status: Home / Self Care | Payer: Self-pay | Source: Ambulatory Visit | Attending: Gastroenterology

## 2016-11-22 ENCOUNTER — Encounter (HOSPITAL_COMMUNITY): Payer: Self-pay

## 2016-11-22 ENCOUNTER — Ambulatory Visit (HOSPITAL_COMMUNITY): Payer: BLUE CROSS/BLUE SHIELD | Admitting: Anesthesiology

## 2016-11-22 DIAGNOSIS — Z79899 Other long term (current) drug therapy: Secondary | ICD-10-CM | POA: Insufficient documentation

## 2016-11-22 DIAGNOSIS — K921 Melena: Secondary | ICD-10-CM | POA: Insufficient documentation

## 2016-11-22 DIAGNOSIS — D649 Anemia, unspecified: Secondary | ICD-10-CM | POA: Diagnosis not present

## 2016-11-22 DIAGNOSIS — G709 Myoneural disorder, unspecified: Secondary | ICD-10-CM | POA: Insufficient documentation

## 2016-11-22 DIAGNOSIS — R1011 Right upper quadrant pain: Secondary | ICD-10-CM | POA: Insufficient documentation

## 2016-11-22 DIAGNOSIS — K5730 Diverticulosis of large intestine without perforation or abscess without bleeding: Secondary | ICD-10-CM | POA: Insufficient documentation

## 2016-11-22 DIAGNOSIS — R1031 Right lower quadrant pain: Secondary | ICD-10-CM | POA: Insufficient documentation

## 2016-11-22 DIAGNOSIS — K219 Gastro-esophageal reflux disease without esophagitis: Secondary | ICD-10-CM | POA: Diagnosis not present

## 2016-11-22 DIAGNOSIS — K648 Other hemorrhoids: Secondary | ICD-10-CM | POA: Diagnosis not present

## 2016-11-22 HISTORY — PX: COLONOSCOPY WITH PROPOFOL: SHX5780

## 2016-11-22 SURGERY — COLONOSCOPY WITH PROPOFOL
Anesthesia: Monitor Anesthesia Care

## 2016-11-22 MED ORDER — LACTATED RINGERS IV SOLN
INTRAVENOUS | Status: DC | PRN
  Administered 2016-11-22: 08:00:00 via INTRAVENOUS

## 2016-11-22 MED ORDER — PROPOFOL 10 MG/ML IV BOLUS
INTRAVENOUS | Status: AC
  Filled 2016-11-22: qty 40

## 2016-11-22 MED ORDER — SODIUM CHLORIDE 0.9 % IV SOLN: INTRAVENOUS | Status: DC

## 2016-11-22 MED ORDER — PROPOFOL 10 MG/ML IV BOLUS
INTRAVENOUS | Status: DC | PRN
  Administered 2016-11-22: 30 mg via INTRAVENOUS
  Administered 2016-11-22: 10 mg via INTRAVENOUS
  Administered 2016-11-22: 40 mg via INTRAVENOUS
  Administered 2016-11-22 (×6): 10 mg via INTRAVENOUS
  Administered 2016-11-22: 20 mg via INTRAVENOUS
  Administered 2016-11-22 (×3): 10 mg via INTRAVENOUS
  Administered 2016-11-22: 20 mg via INTRAVENOUS
  Administered 2016-11-22 (×5): 10 mg via INTRAVENOUS
  Administered 2016-11-22: 20 mg via INTRAVENOUS
  Administered 2016-11-22 (×2): 10 mg via INTRAVENOUS

## 2016-11-22 SURGICAL SUPPLY — 21 items

## 2016-11-22 NOTE — Anesthesia Preprocedure Evaluation (Addendum)
Anesthesia Evaluation  Patient identified by MRN, date of birth, ID band Patient awake    Reviewed: Allergy & Precautions, NPO status , Patient's Chart, lab work & pertinent test results  Airway Mallampati: II  TM Distance: >3 FB     Dental   Pulmonary    breath sounds clear to auscultation       Cardiovascular negative cardio ROS   Rhythm:Regular Rate:Normal     Neuro/Psych  Headaches,  Neuromuscular disease    GI/Hepatic Neg liver ROS, GERD  ,History noted. CG   Endo/Other  negative endocrine ROS  Renal/GU negative Renal ROS     Musculoskeletal   Abdominal   Peds  Hematology  (+) anemia ,   Anesthesia Other Findings   Reproductive/Obstetrics                             Anesthesia Physical Anesthesia Plan  ASA: II  Anesthesia Plan: MAC   Post-op Pain Management:    Induction: Intravenous  Airway Management Planned: Simple Face Mask  Additional Equipment:   Intra-op Plan:   Post-operative Plan:   Informed Consent: I have reviewed the patients History and Physical, chart, labs and discussed the procedure including the risks, benefits and alternatives for the proposed anesthesia with the patient or authorized representative who has indicated his/her understanding and acceptance.   Dental advisory given  Plan Discussed with: CRNA and Anesthesiologist  Anesthesia Plan Comments:         Anesthesia Quick Evaluation

## 2016-11-22 NOTE — Transfer of Care (Signed)
Immediate Anesthesia Transfer of Care Note  Patient: Stacy Jackson  Procedure(s) Performed: Procedure(s): COLONOSCOPY WITH PROPOFOL (N/A)  Patient Location: PACU  Anesthesia Type:MAC  Level of Consciousness: sedated  Airway & Oxygen Therapy: Patient Spontanous Breathing and Patient connected to nasal cannula oxygen  Post-op Assessment: Report given to RN and Post -op Vital signs reviewed and stable  Post vital signs: Reviewed and stable  Last Vitals:  Vitals:   11/22/16 0728  BP: 137/88  Pulse: 78  Resp: 12  Temp: 36.6 C    Last Pain:  Vitals:   11/22/16 0728  PainSc: 6          Complications: No apparent anesthesia complications

## 2016-11-22 NOTE — Anesthesia Postprocedure Evaluation (Signed)
Anesthesia Post Note  Patient: Stacy Jackson  Procedure(s) Performed: Procedure(s) (LRB): COLONOSCOPY WITH PROPOFOL (N/A)  Patient location during evaluation: PACU Anesthesia Type: MAC Level of consciousness: awake Pain management: pain level controlled Respiratory status: spontaneous breathing Cardiovascular status: stable Anesthetic complications: no       Last Vitals:  Vitals:   11/22/16 0840 11/22/16 0845  BP: (!) 125/95   Pulse: (!) 52 69  Resp: (!) 26 (!) 28  Temp:      Last Pain:  Vitals:   11/22/16 0821  TempSrc: Oral  PainSc:                  Stacy Jackson

## 2016-11-22 NOTE — Op Note (Addendum)
[facility] Patient Name: Stacy Jackson Procedure Date: 11/22/2016 MRN: [id number] Attending MD: Arta Silence , MD Date of Birth: 07-24-1981 CSN: [id number] Age: 36 Admit Type: Outpatient Procedure:                Colonoscopy Indications:              This is the patient's first colonoscopy, Abdominal                            pain in the right lower quadrant, Abdominal pain in                            the right upper quadrant, Hematochezia Providers:                Arta Silence, MD, Hilma Favors, RN, Alfonso Patten,                            Technician, [place] Alday CRNA, CRNA Referring MD:             Mack Hook, MD Medicines:                Monitored Anesthesia Care Complications:            No immediate complications. Estimated Blood Loss:     Estimated blood loss: none. Procedure:                Pre-Anesthesia Assessment:                           - Prior to the procedure, a History and Physical                            was performed, and patient medications and                            allergies were reviewed. The patient's tolerance of                            previous anesthesia was also reviewed. The risks                            and benefits of the procedure and the sedation                            options and risks were discussed with the patient.                            All questions were answered, and informed consent                            was obtained. Prior Anticoagulants: The patient has                            taken no previous anticoagulant or antiplatelet  agents. ASA Grade Assessment: II - A patient with                            mild systemic disease. After reviewing the risks                            and benefits, the patient was deemed in                            satisfactory condition to undergo the procedure.                           After obtaining informed consent, the  colonoscope                            was passed under direct vision. Throughout the                            procedure, the patient's blood pressure, pulse, and                            oxygen saturations were monitored continuously. The                            [id number]) scope was introduced through                            the anus and advanced to the the cecum, identified                            by appendiceal orifice and ileocecal valve. The                            ileocecal valve, appendiceal orifice, and rectum                            were identified; unable to photodocument due to                            problem with procedure note-generating software.                            The entire colon was examined. The colonoscopy was                            performed without difficulty. The patient tolerated                            the procedure well. The quality of the bowel                            preparation was good. Findings:      The perianal and digital rectal examinations were normal.      Internal hemorrhoids  were found during retroflexion. The hemorrhoids       were mild.      A few medium-mouthed diverticula were found in the transverse colon and       ascending colon.      Colon otherwise normal; no other polyps, masses, vascular ectasias, or       inflammatory changes were seen.      No additional abnormalities were found on retroflexion. Impression:               - Internal hemorrhoids.                           - Diverticulosis in the transverse colon and in the                            ascending colon.                           - Suspect bleeding is hemorrhoidal in nature. No                            blood seen on today's examination. No source of                            abdominal pain was identified on today's                            examination. Moderate Sedation:      None Recommendation:           - Patient has a  contact number available for                            emergencies. The signs and symptoms of potential                            delayed complications were discussed with the                            patient. Return to normal activities tomorrow.                            Written discharge instructions were provided to the                            patient.                           - Discharge patient to home (via wheelchair).                           - High fiber diet indefinitely.                           - Continue present medications.                           - Topical therapies (  e.g., Preparation-H),                            avoidance of constipation/straining, liberal water                            intake, for management of hemorrhoids.                           - Return to GI clinic in 3 months.                           - Return to primary care physician as previously                            scheduled.                           - Repeat colonoscopy in 10 years for screening                            purposes. Procedure Code(s):        --- Professional ---                           [phone], Colonoscopy, flexible; diagnostic, including                            collection of specimen(s) by brushing or washing,                            when performed (separate procedure) Diagnosis Code(s):        --- Professional ---                           K64.8, Other hemorrhoids                           R10.31, Right lower quadrant pain                           R10.11, Right upper quadrant pain                           K92.1, Melena (includes Hematochezia)                           K57.30, Diverticulosis of large intestine without                            perforation or abscess without bleeding CPT copyright 2016 [employer] Association. All rights reserved. The codes documented in this report are preliminary and upon coder review may  be revised to meet current  compliance requirements. Arta Silence, MD 11/22/2016 8:32:04 AM This report has been signed electronically. Number of Addenda: 0

## 2016-11-22 NOTE — Discharge Instructions (Signed)
Colonoscopy ° °Post procedure instructions: ° °Read the instructions outlined below and refer to this sheet in the next few weeks. These discharge instructions provide you with general information on caring for yourself after you leave the hospital. Your doctor may also give you specific instructions. While your treatment has been planned according to the most current medical practices available, unavoidable complications occasionally occur. If you have any problems or questions after discharge, call Dr. Derak Schurman at [facility] ([phone]). ° °HOME CARE INSTRUCTIONS ° °ACTIVITY: °· You may resume your regular activity, but move at a slower pace for the next 24 hours.  °· Take frequent rest periods for the next 24 hours.  °· Walking will help get rid of the air and reduce the bloated feeling in your belly (abdomen).  °· No driving for 24 hours (because of the medicine (anesthesia) used during the test).  °· You may shower.  °· Do not sign any important legal documents or operate any machinery for 24 hours (because of the anesthesia used during the test).  °NUTRITION: °· Drink plenty of fluids.  °· You may resume your normal diet as instructed by your doctor.  °· Begin with a light meal and progress to your normal diet. Heavy or fried foods are harder to digest and may make you feel sick to your stomach (nauseated).  °· Avoid alcoholic beverages for 24 hours or as instructed.  °MEDICATIONS: °· You may resume your normal medications unless your doctor tells you otherwise.  °WHAT TO EXPECT TODAY: °· Some feelings of bloating in the abdomen.  °· Passage of more gas than usual.  °· Spotting of blood in your stool or on the toilet paper.  °IF YOU HAD POLYPS REMOVED DURING THE COLONOSCOPY: °· No aspirin products for 7 days or as instructed.  °· No alcohol for 7 days or as instructed.  °· Eat a soft diet for the next 24 hours.  ° °FINDING OUT THE RESULTS OF YOUR TEST ° °Not all test results are available during your  visit. If your test results are not back during the visit, make an appointment with your caregiver to find out the results. Do not assume everything is normal if you have not heard from your caregiver or the medical facility. It is important for you to follow up on all of your test results.  ° ° ° °SEEK IMMEDIATE MEDICAL CARE IF: ° °· You have more than a spotting of blood in your stool.  °· Your belly is swollen (abdominal distention).  °· You are nauseated or vomiting.  °· You have a fever.  °· You have abdominal pain or discomfort that is severe or gets worse throughout the day.  ° ° °Document Released: 04/20/2004 Document Revised: 05/19/2011 Document Reviewed: 04/18/2008 °ExitCare® Patient Information ©2012 [employer]. ° °

## 2016-11-22 NOTE — H&P (Signed)
Patient interval history reviewed.  Patient examined again.  There has been no change from documented H/P dated 11/15/16 (scanned into chart from our office) except as documented above.  Assessment:  1.  Hematochezia. 2. Chronic right-sided abdominal pain.  Unrevealing endoscopy, CT, ultrasound.  Plan:  1.  Colonoscopy. 2.  Risks (bleeding, infection, bowel perforation that could require surgery, sedation-related changes in cardiopulmonary systems), benefits (identification and possible treatment of source of symptoms, exclusion of certain causes of symptoms), and alternatives (watchful waiting, radiographic imaging studies, empiric medical treatment) of colonoscopy were explained to patient/family in detail and patient wishes to proceed.

## 2016-11-23 ENCOUNTER — Encounter (HOSPITAL_COMMUNITY): Payer: Self-pay | Admitting: Gastroenterology

## 2017-02-15 ENCOUNTER — Encounter: Payer: Self-pay | Admitting: Internal Medicine

## 2017-02-15 ENCOUNTER — Ambulatory Visit (INDEPENDENT_AMBULATORY_CARE_PROVIDER_SITE_OTHER): Payer: Self-pay | Admitting: Internal Medicine

## 2017-02-15 VITALS — BP 118/70 | HR 64 | Resp 12 | Ht 60.0 in | Wt 127.0 lb

## 2017-02-15 DIAGNOSIS — B9689 Other specified bacterial agents as the cause of diseases classified elsewhere: Secondary | ICD-10-CM

## 2017-02-15 DIAGNOSIS — K648 Other hemorrhoids: Secondary | ICD-10-CM

## 2017-02-15 DIAGNOSIS — R102 Pelvic and perineal pain: Secondary | ICD-10-CM

## 2017-02-15 DIAGNOSIS — K5730 Diverticulosis of large intestine without perforation or abscess without bleeding: Secondary | ICD-10-CM

## 2017-02-15 DIAGNOSIS — G8929 Other chronic pain: Secondary | ICD-10-CM

## 2017-02-15 DIAGNOSIS — N760 Acute vaginitis: Secondary | ICD-10-CM

## 2017-02-15 DIAGNOSIS — K5790 Diverticulosis of intestine, part unspecified, without perforation or abscess without bleeding: Secondary | ICD-10-CM | POA: Insufficient documentation

## 2017-02-15 DIAGNOSIS — D509 Iron deficiency anemia, unspecified: Secondary | ICD-10-CM

## 2017-02-15 DIAGNOSIS — K219 Gastro-esophageal reflux disease without esophagitis: Secondary | ICD-10-CM

## 2017-02-15 HISTORY — DX: Diverticulosis of intestine, part unspecified, without perforation or abscess without bleeding: K57.90

## 2017-02-15 LAB — POCT WET PREP WITH KOH
KOH Prep POC: NEGATIVE
RBC Wet Prep HPF POC: NEGATIVE
TRICHOMONAS UA: NEGATIVE
YEAST WET PREP PER HPF POC: NEGATIVE

## 2017-02-15 LAB — POCT URINALYSIS DIPSTICK
BILIRUBIN UA: NEGATIVE
Blood, UA: NEGATIVE
GLUCOSE UA: NEGATIVE
KETONES UA: NEGATIVE
LEUKOCYTES UA: NEGATIVE
Nitrite, UA: NEGATIVE
Protein, UA: NEGATIVE
Spec Grav, UA: 1.01 (ref 1.010–1.025)
Urobilinogen, UA: 0.2 E.U./dL
pH, UA: 8 (ref 5.0–8.0)

## 2017-02-15 MED ORDER — METRONIDAZOLE 500 MG PO TABS: ORAL_TABLET | ORAL | 0 refills | Status: DC

## 2017-02-15 MED ORDER — GABAPENTIN 400 MG PO CAPS: ORAL_CAPSULE | ORAL | 11 refills | Status: DC

## 2017-02-15 MED ORDER — FAMOTIDINE 20 MG PO TABS: ORAL_TABLET | ORAL | 2 refills | Status: DC

## 2017-02-15 NOTE — Progress Notes (Signed)
Subjective:    Patient ID: Stacy Jackson, female    DOB: Jan 31, 1981, 36 y.o.   MRN: [id number]  HPI   1.  RLQ pain:  Underwent Colonoscopy with Dr. Paulita Fujita 11/22/2016:  Showed ascending and transverse colon diverticulosis--mild.  Also internal hemorrhoids. No documentation of changes to suggest partial obstruction due to adhesions, though may not be able to see this on colonoscopy. Schedule changed and so taking Gabapentin now as ordered:  300 mg in morning and midday and 400 mg at bedtime.  Takes last dose generally at midnight and heads to bed at 1 a.m.  Feels this has taken an edge off the right abdominal pelvic pain.   2.  Sleep:   Taking an hour to initiate sleep.  Once asleep, still waking every hour.  Takes 15-20 minutes to reinitiate sleep. 2-3 days out of the week is working 4pm to 4 am, however.  Has had this new schedule going on 2 months. Has been using sleep gummies with Melatonin. Puts on sleep mask and sleep station with Pandora when having difficulties.  Stays in bed.  Likes to read, but does not often do that to initiate sleep.  3.  GERD:  Taking 5 out of 7 days.  If skips more, gets heartburn. Needs refill.  4.  Low pelvic cramps:  Started 3 days ago.  When went to the bathroom to urinate 2 days ago, noted light blood on tissue.  Had TAH 2014 for unsuccessful LEEP.  Has had vaginal discharge as well.  D/C is white cloudy.  No odor.  No itching, though some irritation.   No burning on urination.  Still with some of the cramping.  No urinary frequency or incontinence.  Medication list is that after orders written today.  Current Meds  Medication Sig  . famotidine (PEPCID) 20 MG tablet 3 tabs by mouth 1 hour before breakfast daily  . ferrous sulfate 325 (65 FE) MG tablet Take 325 mg by mouth daily with breakfast.  . gabapentin (NEURONTIN) 400 MG capsule 1 cap by mouth three times daily  . traMADol (ULTRAM) 50 MG tablet Take by mouth every 6 (six) hours as needed.  .  [DISCONTINUED] famotidine (PEPCID) 20 MG tablet 3 tabs by mouth 1 hour before breakfast daily (Patient taking differently: Take 60 mg by mouth every morning. 3 tabs by mouth 1 hour before breakfast daily)  . [DISCONTINUED] gabapentin (NEURONTIN) 100 MG capsule 3 caps by mouth in the morning and  4 caps by mouth at bedtime (Patient taking differently: Take 100-200 mg by mouth See admin instructions. 3 in the am, 3 at 3 pm and 4 at bedtime)   Allergies  Allergen Reactions  . Shrimp [Shellfish Allergy] Anaphylaxis         Review of Systems     Objective:   Physical Exam NAD Lungs:  CTA CV:  RRR  Abd:  S, + BS, No HSM or mass, right abdominal musculature tight. GU:  Normal external genitalia, thick white discharge with + fishy odor. No obvious source of bleeding.  No mucosal inflammation.  No palpable uterus.  No adnexal mass or tenderness.  Again musculature on right tight.   UA normal    Assessment & Plan:  1.  Chronic right abdominal/pelvic pain:  Working diagnosis is abdominal/pelvic adhesions.  Recently diagnosed with some diverticulosis in ascending and transverse colon, which could be an additional element. Is doing better with Gabapentin and somewhat improved sleep. Will have her take up  her Gabapentin dosing to 400 mg caps 3 times daily for ease of taking as well as pain improvement. To gradually increase fiber with diet to see if helps as well.  May add fiber laxative if unable to improve adequately with diet.  2.  Insomnia:  Discussed Melatonin from [facility] instead of OTC where she is currently obtaining.   Encouraged reading in another room if unable to fall asleep in 15-20 minutes.  Her schedule is still disruptive for her to get into a great sleep routine, but better than before.  3.  GERD:  Refilled Famotidine.  4.  Bacterial Vaginosis:  Wet prep supports BV.  UA is negative for infection.  Metronidazole 500 mg twice daily for 7 days.  To call if develops  symptoms of yeast.  5.  Iron Deficiency Anemia:  CBC.  Blood loss through GI tract felt likely to be secondary to hemorrhoidal bleeding.  Followup in 3 months.

## 2017-02-15 NOTE — Patient Instructions (Addendum)
[facility] [address]  [place], [place] [address] [phone]  Melatonin for sleep at bedtime 40% discount for [facility]   Drink a glass of water before every meal Drink 6-8 glasses of water daily Eat three meals daily Eat a protein and healthy fat with every meal (eggs,fish, chicken, [place] and limit red meats) Eat 5 servings of vegetables daily, mix the colors Eat 2 servings of fruit daily with skin, if skin is edible Use smaller plates Put food/utensils down as you chew and swallow each bite Eat at a table with friends/family at least once daily, no TV Do not eat in front of the TV

## 2017-02-16 LAB — CBC WITH DIFFERENTIAL/PLATELET
Basophils Absolute: 0 10*3/uL (ref 0.0–0.2)
Basos: 0 %
EOS (ABSOLUTE): 0 10*3/uL (ref 0.0–0.4)
EOS: 1 %
HEMATOCRIT: 35.5 % (ref 34.0–46.6)
Hemoglobin: 11.2 g/dL (ref 11.1–15.9)
IMMATURE GRANS (ABS): 0 10*3/uL (ref 0.0–0.1)
Immature Granulocytes: 0 %
LYMPHS ABS: 1.6 10*3/uL (ref 0.7–3.1)
Lymphs: 25 %
MCH: 24.7 pg — AB (ref 26.6–33.0)
MCHC: 31.5 g/dL (ref 31.5–35.7)
MCV: 78 fL — AB (ref 79–97)
MONOCYTES: 7 %
MONOS ABS: 0.5 10*3/uL (ref 0.1–0.9)
NEUTROS PCT: 67 %
Neutrophils Absolute: 4.2 10*3/uL (ref 1.4–7.0)
PLATELETS: 325 10*3/uL (ref 150–379)
RBC: 4.54 x10E6/uL (ref 3.77–5.28)
RDW: 16.5 % — ABNORMAL HIGH (ref 12.3–15.4)
WBC: 6.4 10*3/uL (ref 3.4–10.8)

## 2017-03-21 ENCOUNTER — Telehealth: Payer: Self-pay | Admitting: Internal Medicine

## 2017-03-21 ENCOUNTER — Other Ambulatory Visit: Payer: Self-pay | Admitting: Internal Medicine

## 2017-03-21 NOTE — Telephone Encounter (Signed)
Patient needs refill of traMADol (ULTRAM) 50 mg.

## 2017-03-22 NOTE — Telephone Encounter (Signed)
Rx faxed to pharmacy  

## 2017-03-24 ENCOUNTER — Other Ambulatory Visit: Payer: Self-pay

## 2017-03-24 ENCOUNTER — Telehealth: Payer: Self-pay | Admitting: Internal Medicine

## 2017-03-24 MED ORDER — GABAPENTIN 400 MG PO CAPS: ORAL_CAPSULE | ORAL | 11 refills | Status: DC

## 2017-03-24 NOTE — Telephone Encounter (Signed)
Rx faxed to pharmacy  

## 2017-03-24 NOTE — Telephone Encounter (Signed)
Patient called regarding Gabapetin 400 mg medication needs to be send to [facility]

## 2017-04-08 ENCOUNTER — Telehealth: Payer: Self-pay | Admitting: Internal Medicine

## 2017-04-08 NOTE — Telephone Encounter (Signed)
Patient called stating needs a short-term refill up to Monday of Hydrocodone/Acetaminophen, As was told by the hospital.

## 2017-04-08 NOTE — Telephone Encounter (Signed)
To Stacy Jackson. Please call patient on Monday morning and  Get info for her hospital stay. She is coming in on Monday afternoon and Dr. Amil Amen needs records.

## 2017-04-08 NOTE — Telephone Encounter (Signed)
Called patient and no answer and voicemail is full 

## 2017-04-11 ENCOUNTER — Ambulatory Visit (INDEPENDENT_AMBULATORY_CARE_PROVIDER_SITE_OTHER): Payer: Self-pay | Admitting: Internal Medicine

## 2017-04-11 ENCOUNTER — Encounter: Payer: Self-pay | Admitting: Internal Medicine

## 2017-04-11 VITALS — BP 110/80 | HR 80 | Resp 16 | Ht 61.0 in | Wt 129.5 lb

## 2017-04-11 DIAGNOSIS — G8929 Other chronic pain: Secondary | ICD-10-CM | POA: Diagnosis not present

## 2017-04-11 DIAGNOSIS — R569 Unspecified convulsions: Secondary | ICD-10-CM

## 2017-04-11 DIAGNOSIS — R102 Pelvic and perineal pain: Secondary | ICD-10-CM

## 2017-04-11 MED ORDER — DICYCLOMINE HCL 10 MG PO CAPS: 10.0000 mg | ORAL_CAPSULE | Freq: Four times a day (QID) | ORAL | 2 refills | Status: DC

## 2017-04-11 NOTE — Telephone Encounter (Signed)
Fraser Din called and received Medical Records.  Pat gave Medical Records to Dr. Amil Amen.

## 2017-04-11 NOTE — Progress Notes (Signed)
Subjective:    Patient ID: Stacy Jackson, female    DOB: January 12, 1981, 36 y.o.   MRN: [id number]  HPI   Goes by Stacy Jackson, her maiden name, in [place].    Have records and patient gives history of having a seizure 8 days ago just before noon in church while visiting family in [place].  No loss of consciousness--just feels like "under water"  Was sitting down and was helped to the floor when the episode started.  No urine or stool incontinence subsequently.  Lasted 25 minutes. No biting of tongue.  Prior to the episode, she felt odd and walked to back of church to notify her uncle--felt "like water running through her and all tingly inside".  Her vision was not exactly clear as well--kept cleaning her glasses as she thought they were smudged.    Did have a headache before- like tightness.  Headache later felt throbbing in nature.  The latter lasted 24 hours.  Describes photophobia and nausea perhaps vomiting with this headache.  These symptoms were similar to previous episodes noted below. Does have a history of complicated migraine with neurologic abnormalities in past.  States she was diagnosed with seizure disorder.in 2008. Do not recall this history before.   States had 3-4 seizures over 2 months time.  Was treated with Depakote.   In 2008, Had CT scan of brain, EEG.  States this is when they ultimately diagnosed complicated migraines as well.  Was on Depakote until 2014, though sounds like stopped for a while in 2010 and 2012.   Started having abdominal pain in 2014, so stopped the Depakote as she had not had a seizure for years. Was placed on ?Nortripyline, Trazodone at bedtime as well as Protonix.   Had not had an episode of seizure like activity since 2013.   With recent hospital visit, patient was first evaluated in ED in [place], [place] and then transferred to [facility] at family's request for evaluation by Neurology. Patient states she had multiple episodes of  seizure like activity at before and during both hospital presentations.  Only one episode noted in ED in documentation received from [facility].  Underwent CT scan and MRI of brain, EEG, laboratory including CBC, Lactic acid, CMP, prolactin--all normal.  Assessment from her attending physician, Darcus Austin, M.D. in [place] describe patient having an episode of tonic clonic activity at referring [facility] hospital just prior to discharge during which patient still had the ability to follow directions and engage.   He felt these episodes were likely psychogenic in origin.  Has been told she cannot drive for  6 months.   Was told to stop Tramadol at [place] hospital visit due to decreased seizure threshold with this medication.  Given Rx for Dicyclomine and hydrocodone (small Rx for this) instead.  Have discussed with patient in past we would not continue narcotics for her chronic abdominal pain.  Current Meds  Medication Sig  . dicyclomine (BENTYL) 10 MG capsule Take 1 capsule (10 mg total) by mouth every 6 (six) hours. As needed for abdominal pain  . famotidine (PEPCID) 20 MG tablet 3 tabs by mouth 1 hour before breakfast daily  . gabapentin (NEURONTIN) 400 MG capsule 1 cap by mouth three times daily  . HYDROcodone-acetaminophen (NORCO/VICODIN) 5-325 MG tablet Take 1 tablet by mouth every 6 (six) hours as needed for moderate pain.  . [DISCONTINUED] dicyclomine (BENTYL) 10 MG capsule Take 10 mg by mouth every 6 (six) hours. As needes  Allergies  Allergen Reactions  . Shrimp [Shellfish Allergy] Anaphylaxis    Review of Systems     Objective:   Physical Exam NAD HEENT:  PERRL, EOMI, TMs pearly gray, throat without injection Neck:  Supple, No adenopathy, no thyromegaly Chest:  CTA CV:  RRR with normal S1 and S2, No S3, S4 or murmur, radial and DP pulses normal and equal. Abd:  S, NT, No HSM or mass, + BS LE: No edema Neuro:  A & O x 3, CN II-XII grossly intact.  DTRs 2+/4  throughout, Motor 5/5 throughout, sensory grossly normal.  Normal gait.  Sensory to light touch decreased in scattered fashion on feet and lower legs in fashion that cannot map.       Assessment & Plan:  1.  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   Sending for records regarding this from 2008.  Do not recall sharing this history previously, which is odd. 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  Also complicated migraines.  2.  Chronic abdominal/pelvic pain:  Refill Dicyclomine.  Has utilized antispasmodics previously.

## 2017-04-12 ENCOUNTER — Encounter: Payer: Self-pay | Admitting: Internal Medicine

## 2017-04-21 ENCOUNTER — Telehealth: Payer: Self-pay

## 2017-04-21 NOTE — Telephone Encounter (Signed)
Patient called stating she does not think Dr. Amil Amen wrote her prescription correct for her Bentyl. States she only gave her 28 pills and that is not enough to last for 30 days. States she is taking 2 a day because she has no other pain medicine to take. Asking for Dr. Amil Amen to prescribe something different for pain since she can no longer take tramadol. To Dr. Amil Amen for further direction.

## 2017-04-22 ENCOUNTER — Other Ambulatory Visit: Payer: Self-pay

## 2017-04-22 MED ORDER — DICYCLOMINE HCL 10 MG PO CAPS: 10.0000 mg | ORAL_CAPSULE | Freq: Two times a day (BID) | ORAL | 2 refills | Status: DC | PRN

## 2017-04-22 NOTE — Telephone Encounter (Signed)
Spoke with patient. Informed per Dr.Mulberry she is willing to increase her Rx for bentyl to 1 twice a day for now. Patient verbalized understanding and Rx was sent to pharmacy

## 2017-06-01 ENCOUNTER — Ambulatory Visit (INDEPENDENT_AMBULATORY_CARE_PROVIDER_SITE_OTHER): Payer: BLUE CROSS/BLUE SHIELD | Admitting: Diagnostic Neuroimaging

## 2017-06-01 ENCOUNTER — Encounter: Payer: Self-pay | Admitting: Diagnostic Neuroimaging

## 2017-06-01 VITALS — BP 109/78 | HR 86 | Ht 59.0 in | Wt 132.6 lb

## 2017-06-01 DIAGNOSIS — R4689 Other symptoms and signs involving appearance and behavior: Secondary | ICD-10-CM | POA: Diagnosis not present

## 2017-06-01 MED ORDER — TOPIRAMATE 50 MG PO TABS
50.0000 mg | ORAL_TABLET | Freq: Two times a day (BID) | ORAL | 12 refills | Status: DC
Start: 2017-06-01 — End: 2017-10-25

## 2017-06-01 NOTE — Progress Notes (Signed)
[facility]  PATIENT: Stacy Jackson DOB: 23-Oct-1980  REFERRING CLINICIAN: Jacqualine Code, MD HISTORY FROM: patient and chart review REASON FOR VISIT: new consult    HISTORICAL  CHIEF COMPLAINT:  Chief Complaint  Patient presents with  . NP  Mulberry  . Seizures    last sz mid luly.  Taking gabapentin 400mg  po tid. (had seen Dr. in [place], diagnosed).      HISTORY OF PRESENT ILLNESS:   36 year old female here for evaluation of seizures.  In 2008 patient had onset of intermittent episodes of tremors, disconnected feeling, slow responses, lasting 1-2 minutes. Patient was having these every few weeks or every few months. They tend to occur in the evening time. These continued for several years. Around 2011 patient was diagnosed with possible seizure disorder. She was started on medications including Depakote and Trileptal which seemed to help initially. By 2012 she stopped having these episodes which were possible seizures.  In July 2018 patient was at church, when all of a sudden she had trouble seeing with blurred vision, and had jittery tremulous movements. She was taken to local hospital and then transferred to a larger [facility] in [place]. Patient was observed to have abnormal movements, but apparently was able to respond and follow commands. Therefore possibility of nonoperative spells or pseudoseizures was raised. She had MRI of the brain and EEG testing which were unremarkable. She was advised to stop tramadol and follow-up with a local neurologist. I reviewed records from this evaluation myself, which were scanned into the media tab of our EMR.  Patient continues to have other neurologic symptoms including right-sided numbness, right-sided weakness, slurred speech, word finding difficulties. She also reports significant insomnia and fatigue.  In addition patient has had intermittent episodes of headache, with right-sided throbbing pain, nausea,  photophobia. These recurred 1-2 times per month. Sometimes she sees black spots or vision changes with these headaches.  Patient denies any history of head trauma, meningitis or encephalitis. She denies any overt causes of stress, anxiety or depression. She says that she tends to be the one in her family who listens to others who have problems. She did report seeing a therapist or counselor in the past for some reason but patient did not further specified. Patient also has history of chronic abdominal pain, which has not clearly been diagnosed with a specific cause.    REVIEW OF SYSTEMS: Full 14 system review of systems performed and negative with exception of: As per history of present illness.  ALLERGIES: Allergies  Allergen Reactions  . Shrimp [Shellfish Allergy] Anaphylaxis    HOME MEDICATIONS: Outpatient Medications Prior to Visit  Medication Sig Dispense Refill  . dicyclomine (BENTYL) 10 MG capsule Take 1 capsule (10 mg total) by mouth every 12 (twelve) hours as needed for spasms. As needed for abdominal pain 60 capsule 2  . famotidine (PEPCID) 20 MG tablet 3 tabs by mouth 1 hour before breakfast daily 90 tablet 2  . gabapentin (NEURONTIN) 400 MG capsule 1 cap by mouth three times daily 90 capsule 11  . ferrous sulfate 325 (65 FE) MG tablet Take 325 mg by mouth daily with breakfast.    . HYDROcodone-acetaminophen (NORCO/VICODIN) 5-325 MG tablet Take 1 tablet by mouth every 6 (six) hours as needed for moderate pain.     No facility-administered medications prior to visit.     PAST MEDICAL HISTORY: Past Medical History:  Diagnosis Date  . Cervical cancer ([place]) 2015   Unsuccessful LEEP, then TAH  .  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 Complicated migraine 1696   Left arm numbness, states she may have had a "mini-stroke"  . Diverticulosis 02/15/2017  . Hiatal hernia with  gastroesophageal reflux 7893   Fundoplication in 8101  . History of obesity 2010  . Ovarian cyst right   2008    PAST SURGICAL HISTORY: Past Surgical History:  Procedure Laterality Date  . ABDOMINAL HYSTERECTOMY  2015   For cervical cancer unresponsive to LEEP  . CESAREAN SECTION  2003, 2008  . COLONOSCOPY WITH PROPOFOL N/A 11/22/2016   Procedure: COLONOSCOPY WITH PROPOFOL;  Surgeon: Arta Silence, MD;  Location: [facility]COPY;  Service: Endoscopy;  Laterality: N/A;  . ESOPHAGOGASTRODUODENOSCOPY  01/13/2015   Normal biopsy of gastric mucosa/gastric pouch  . NISSEN FUNDOPLICATION  7510  . OOPHORECTOMY Right 2008   Done at same time of Csection  . ROUX-EN-Y GASTRIC BYPASS  ?with Nissen fundoplication   Unknown year--found in GI records from [facility] as bariatric procedure.    FAMILY HISTORY: Family History  Problem Relation Age of Onset  . Renal cancer Mother   . Hypertension Mother   . Ulcerative colitis Father   . Diabetes Father   . Hypertension Father   . Diabetes Brother   . Hypertension Brother   . Asthma Brother   . Sickle cell trait Daughter   . Eczema Son   . Autism spectrum disorder Son   . Asthma Brother   . Eczema Brother   . Breast cancer Maternal Grandmother     SOCIAL HISTORY:  Social History   Social History  . Marital status: Single    Spouse name: N/A  . Number of children: 2  . Years of education: 16   Occupational History  . Operations at a golf [employer]    Social History Main Topics  . Smoking status: Never Smoker  . Smokeless tobacco: Never Used  . Alcohol use No  . Drug use: No  . Sexual activity: Not Currently   Other Topics Concern  . Not on file   Social History Narrative   Lives at home with her 2 kids.     Children have different fathers--both involved in her children's lives   Discusses her children needed to get set up with school when home fell through in [place], so stayed with her parents until she could get her feet  under her --eventually moved to [place] to be closer to them [facility])        PHYSICAL EXAM  GENERAL EXAM/CONSTITUTIONAL: Vitals:  Vitals:   06/01/17 1253  BP: 109/78  Pulse: 86  Weight: 132 lb 9.6 oz (60.1 kg)  Height: 4\' 11"  (1.499 m)     Body mass index is 26.78 kg/m.  No exam data present  Patient is in no distress; well developed, nourished and groomed; neck is supple  CARDIOVASCULAR:  Examination of carotid arteries is normal; no carotid bruits  Regular rate and rhythm, no murmurs  Examination of peripheral vascular system by observation and palpation is normal  EYES:  Ophthalmoscopic exam of optic discs and posterior segments is normal; no papilledema or hemorrhages  MUSCULOSKELETAL:  Gait, strength, tone, movements noted in Neurologic exam below  NEUROLOGIC: MENTAL STATUS:  No flowsheet data found.  awake, alert, oriented to person, place and time  recent and remote memory intact  normal attention and concentration  language fluent, comprehension intact, naming intact,   fund  of knowledge appropriate  CRANIAL NERVE:   2nd - no papilledema on fundoscopic exam  2nd, 3rd, 4th, 6th - pupils equal and reactive to light, visual fields full to confrontation, extraocular muscles intact, no nystagmus  5th - facial sensation symmetric  7th - facial strength symmetric  8th - hearing intact  9th - palate elevates symmetrically, uvula midline  11th - shoulder shrug symmetric  12th - tongue protrusion midline  MOTOR:   normal bulk and tone, full strength in the BUE, BLE  SENSORY:   normal and symmetric to light touch, temperature, vibration  COORDINATION:   finger-nose-finger, fine finger movements normal  REFLEXES:   deep tendon reflexes present and symmetric  GAIT/STATION:   narrow based gait; able to walk tandem; romberg is negative    DIAGNOSTIC DATA (LABS, IMAGING, TESTING) - I reviewed patient records, labs, notes,  testing and imaging myself where available.  Lab Results  Component Value Date   WBC 6.4 02/15/2017   HGB 11.2 02/15/2017   HCT 35.5 02/15/2017   MCV 78 (L) 02/15/2017   PLT 325 02/15/2017      Component Value Date/Time   NA 141 06/24/2016 0936   K 4.4 06/24/2016 0936   CL 103 06/24/2016 0936   CO2 25 06/24/2016 0936   GLUCOSE 79 06/24/2016 0936   BUN 11 06/24/2016 0936   CREATININE 0.65 06/24/2016 0936   CALCIUM 8.8 06/24/2016 0936   PROT 6.2 06/24/2016 0936   ALBUMIN 4.1 06/24/2016 0936   AST 15 06/24/2016 0936   ALT 11 06/24/2016 0936   ALKPHOS 67 06/24/2016 0936   BILITOT 0.3 06/24/2016 0936   GFRNONAA 116 06/24/2016 0936   GFRAA 134 06/24/2016 0936   Lab Results  Component Value Date   CHOL 167 06/24/2016   HDL 79 06/24/2016   LDLCALC 81 06/24/2016   TRIG 33 06/24/2016   No results found for: HGBA1C No results found for: VITAMINB12 No results found for: TSH   July 2018 MRI brain ([place]; [place]) - negative  July 2018 EEG ([facility]; [place]) - normal     ASSESSMENT AND PLAN  36 y.o. year old female here with History of seizure disorder since 2008, treated with antiseizure medications until 2012, with recurrence of abnormal spell in July 2018. Possibilities include ablative seizure versus non-epileptic spells. Will proceed with further workup.   Dx:  1. Spell of abnormal behavior      PLAN:  I spent 60 minutes of face to face time with patient. Greater than 50% of time was spent in counseling and coordination of care with patient. In summary we discussed:    ABNORMAL SPELLS (seizures vs non-epileptic spells) - I considered repeat MRI brain and EEG; but since she just had testing done in July, will hold off and refer patient for more definitive diagnosis with long term video EEG - According to  law, you can not drive unless you are seizure / syncope free for at least 6 months and under physician's care.  - Please maintain  precautions. Do not participate in activities where a loss of awareness could harm you or someone else. No swimming alone, no tub bathing, no hot tubs, no driving, no operating motorized vehicles (cars, ATVs, motocycles, etc), lawnmowers, power tools or firearms. No standing at heights, such as rooftops, ladders or stairs. Avoid hot objects such as stoves, heaters, open fires. Wear a helmet when riding a bicycle, scooter, skateboard, etc. and avoid areas of traffic. Set your water heater  to 120 degrees or less.  MIGRAINE WITH AURA - start topiramate for migraine prevention - OTC meds for migraine rescue  INSOMNIA / ANXIETY - consider therapist or counselor   Orders Placed This Encounter  Procedures  . Ambulatory referral to Neurology   Meds ordered this encounter  Medications  . topiramate (TOPAMAX) 50 MG tablet    Sig: Take 1 tablet (50 mg total) by mouth 2 (two) times daily.    Dispense:  60 tablet    Refill:  12   Return in about 4 months (around 10/01/2017).  I reviewed images, labs, notes, records myself. I summarized findings and reviewed with patient, for this high risk condition (seizure vs non-epileptic spells) requiring high complexity decision making.    Penni Bombard, MD 10/12/4495, 5:30 PM Certified in Neurology, Neurophysiology and Neuroimaging  [facility] [address] [place], [place] [address] [phone]

## 2017-06-01 NOTE — Patient Instructions (Signed)

## 2017-07-11 ENCOUNTER — Ambulatory Visit (INDEPENDENT_AMBULATORY_CARE_PROVIDER_SITE_OTHER): Payer: BLUE CROSS/BLUE SHIELD | Admitting: Internal Medicine

## 2017-07-11 ENCOUNTER — Encounter: Payer: Self-pay | Admitting: Internal Medicine

## 2017-07-11 VITALS — BP 120/88 | HR 72 | Resp 12 | Ht 61.0 in | Wt 129.0 lb

## 2017-07-11 DIAGNOSIS — G8929 Other chronic pain: Secondary | ICD-10-CM | POA: Diagnosis not present

## 2017-07-11 DIAGNOSIS — N898 Other specified noninflammatory disorders of vagina: Secondary | ICD-10-CM

## 2017-07-11 DIAGNOSIS — R102 Pelvic and perineal pain: Secondary | ICD-10-CM

## 2017-07-11 DIAGNOSIS — L989 Disorder of the skin and subcutaneous tissue, unspecified: Secondary | ICD-10-CM | POA: Diagnosis not present

## 2017-07-11 LAB — POCT WET PREP WITH KOH
KOH Prep POC: NEGATIVE
RBC WET PREP PER HPF POC: NEGATIVE
Trichomonas, UA: NEGATIVE

## 2017-07-11 MED ORDER — DICYCLOMINE HCL 10 MG PO CAPS: 10.0000 mg | ORAL_CAPSULE | Freq: Two times a day (BID) | ORAL | 4 refills | Status: DC | PRN

## 2017-07-11 MED ORDER — FAMOTIDINE 20 MG PO TABS: ORAL_TABLET | ORAL | 6 refills | Status: DC

## 2017-07-11 MED ORDER — FLUCONAZOLE 150 MG PO TABS: ORAL_TABLET | ORAL | 0 refills | Status: DC
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
## 2017-07-18 DIAGNOSIS — R109 Unspecified abdominal pain: Secondary | ICD-10-CM | POA: Insufficient documentation

## 2017-07-18 DIAGNOSIS — R251 Tremor, unspecified: Secondary | ICD-10-CM | POA: Insufficient documentation

## 2017-07-18 DIAGNOSIS — R202 Paresthesia of skin: Secondary | ICD-10-CM | POA: Insufficient documentation

## 2017-07-18 DIAGNOSIS — G4700 Insomnia, unspecified: Secondary | ICD-10-CM | POA: Insufficient documentation

## 2017-07-18 DIAGNOSIS — R519 Headache, unspecified: Secondary | ICD-10-CM | POA: Insufficient documentation

## 2017-08-17 ENCOUNTER — Ambulatory Visit: Payer: BLUE CROSS/BLUE SHIELD | Admitting: Internal Medicine

## 2017-08-17 ENCOUNTER — Encounter: Payer: Self-pay | Admitting: Internal Medicine

## 2017-08-17 VITALS — BP 128/82 | HR 66 | Resp 12 | Ht 61.0 in | Wt 132.0 lb

## 2017-08-17 DIAGNOSIS — Z23 Encounter for immunization: Secondary | ICD-10-CM

## 2017-08-17 DIAGNOSIS — R1032 Left lower quadrant pain: Secondary | ICD-10-CM | POA: Diagnosis not present

## 2017-08-17 DIAGNOSIS — B009 Herpesviral infection, unspecified: Secondary | ICD-10-CM

## 2017-08-17 MED ORDER — HYDROCODONE-ACETAMINOPHEN 5-325 MG PO TABS: 1.0000 | ORAL_TABLET | Freq: Four times a day (QID) | ORAL | 0 refills | Status: DC | PRN

## 2017-08-17 MED ORDER — VALACYCLOVIR HCL 500 MG PO TABS: ORAL_TABLET | ORAL | 11 refills | Status: DC

## 2017-08-17 MED ORDER — DULOXETINE HCL 30 MG PO CPEP: 30.0000 mg | ORAL_CAPSULE | Freq: Every day | ORAL | 2 refills | Status: DC

## 2017-08-17 NOTE — Progress Notes (Addendum)
   Subjective:    Patient ID: Stacy Jackson, female    DOB: [birth year], 36 y.o.   MRN: [id number]  HPI   Newer abdominal pain:  For 3 weeks, constant and in same area as her usual pain.  Describes a swelling of the right mid to lower abdomen, then a twisting pain.  Almost feels like she needs to reach down and support or carry that area of her abdomen as it feels heavy. Unable to walk with the pain at times.   Can make her nauseated.  Has had one episode of vomiting with this--night before last. No constipation.  Stools are soft, but no diarrhea.  No melena or hematochezia.   No significant change with BM, passing of flatus or belching--just a small improvement at times.   Eating definitely makes the pain worse.   Her weight has actually gone up.   Bentyl does not seem to help. Gabapentin 400 mg 3 times daily still, but does not seem to help.    Also with outbreak of lesion on her left shoulder blade area.  Has had these recurrently.   After evaluation, does note these lesions at time of stress.  Previous descriptions sounded like an infected sebaceous cyst, but exam just showed superficial scarring in a broad area at her superior lateral scapula. Has these lesions maybe once monthly to every other month and last several days before healing.  Generally they go from the blister with burning and itching to a hard lesion from which she can later express pus.  Current Meds  Medication Sig  . dicyclomine (BENTYL) 10 MG capsule Take 1 capsule (10 mg total) by mouth every 12 (twelve) hours as needed for spasms. As needed for abdominal pain  . famotidine (PEPCID) 20 MG tablet 3 tabs by mouth 1 hour before breakfast daily  . gabapentin (NEURONTIN) 400 MG capsule 1 cap by mouth three times daily    Allergies  Allergen Reactions  . Shrimp [Shellfish Allergy] Anaphylaxis       Review of Systems     Objective:   Physical Exam   NAD Lungs:  CTA CV: RRR without murmur or rub, radial pulses  normal and equal Abd:  S, mild to moderate tenderness in LLQ, no rebound or peritoneal signs.  + BS--more prominent in left abdom Skin:  Macerated partially deroofed vesicle with what appear to be smaller surrounding vesicles on red base at left superior lateral scapular area.        Assessment & Plan:  1. RLQ pain:  Has had extensive work up in past without definitive findings.  Has had abdominal surgery in past and possibly with adhesions adding to this, but feel most likely with IBS. Check acute abdomen to look for possible findings of partial bowel obstruction. Start Cymbalta low dose 30 mg daily with follow up in 1 week.  Discussed possible side effects, to call if problematic.  2.  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   Start Valcyclovir 500 mg twice daily for 3 days, then once daily for suppression.

## 2017-08-18 ENCOUNTER — Ambulatory Visit
Admission: RE | Admit: 2017-08-18 | Discharge: 2017-08-18 | Disposition: A | Discharge status: Home / Self Care | Payer: BLUE CROSS/BLUE SHIELD | Source: Ambulatory Visit | Attending: Internal Medicine | Admitting: Internal Medicine

## 2017-08-18 ENCOUNTER — Other Ambulatory Visit: Payer: Self-pay | Admitting: Internal Medicine

## 2017-08-18 DIAGNOSIS — R1031 Right lower quadrant pain: Secondary | ICD-10-CM

## 2017-08-18 DIAGNOSIS — R1032 Left lower quadrant pain: Secondary | ICD-10-CM

## 2017-08-18 LAB — CBC WITH DIFFERENTIAL/PLATELET
BASOS: 0 %
Basophils Absolute: 0 10*3/uL (ref 0.0–0.2)
EOS (ABSOLUTE): 0 10*3/uL (ref 0.0–0.4)
EOS: 1 %
HEMATOCRIT: 34 % (ref 34.0–46.6)
HEMOGLOBIN: 11.1 g/dL (ref 11.1–15.9)
Immature Grans (Abs): 0 10*3/uL (ref 0.0–0.1)
Immature Granulocytes: 0 %
LYMPHS ABS: 1.6 10*3/uL (ref 0.7–3.1)
Lymphs: 32 %
MCH: 25.4 pg — ABNORMAL LOW (ref 26.6–33.0)
MCHC: 32.6 g/dL (ref 31.5–35.7)
MCV: 78 fL — AB (ref 79–97)
MONOS ABS: 0.3 10*3/uL (ref 0.1–0.9)
Monocytes: 7 %
Neutrophils Absolute: 3 10*3/uL (ref 1.4–7.0)
Neutrophils: 60 %
Platelets: 289 10*3/uL (ref 150–379)
RBC: 4.37 x10E6/uL (ref 3.77–5.28)
RDW: 15.8 % — AB (ref 12.3–15.4)
WBC: 4.9 10*3/uL (ref 3.4–10.8)

## 2017-08-18 LAB — COMPREHENSIVE METABOLIC PANEL
A/G RATIO: 2 (ref 1.2–2.2)
ALBUMIN: 4.3 g/dL (ref 3.5–5.5)
ALK PHOS: 58 IU/L (ref 39–117)
ALT: 12 IU/L (ref 0–32)
AST: 15 IU/L (ref 0–40)
BUN / CREAT RATIO: 14 (ref 9–23)
BUN: 9 mg/dL (ref 6–20)
Bilirubin Total: 0.2 mg/dL (ref 0.0–1.2)
CALCIUM: 8.8 mg/dL (ref 8.7–10.2)
CHLORIDE: 104 mmol/L (ref 96–106)
CO2: 24 mmol/L (ref 20–29)
CREATININE: 0.66 mg/dL (ref 0.57–1.00)
GFR calc Af Amer: 131 mL/min/{1.73_m2} (ref >59)
GFR calc non Af Amer: 114 mL/min/{1.73_m2} (ref >59)
GLOBULIN, TOTAL: 2.2 g/dL (ref 1.5–4.5)
Glucose: 74 mg/dL (ref 65–99)
Potassium: 5 mmol/L (ref 3.5–5.2)
Sodium: 140 mmol/L (ref 134–144)
Total Protein: 6.5 g/dL (ref 6.0–8.5)

## 2017-08-18 IMAGING — DX DG ABDOMEN ACUTE W/ 1V CHEST
3 series · 3 of 3 positions shown · non-contrast
Comparison: None.

CLINICAL DATA: Right lower quadrant pain for 3 weeks, some nausea
and vomiting

EXAM:
DG ABDOMEN ACUTE W/ 1V CHEST

[dg abd acute w/chest (1 of 3)]
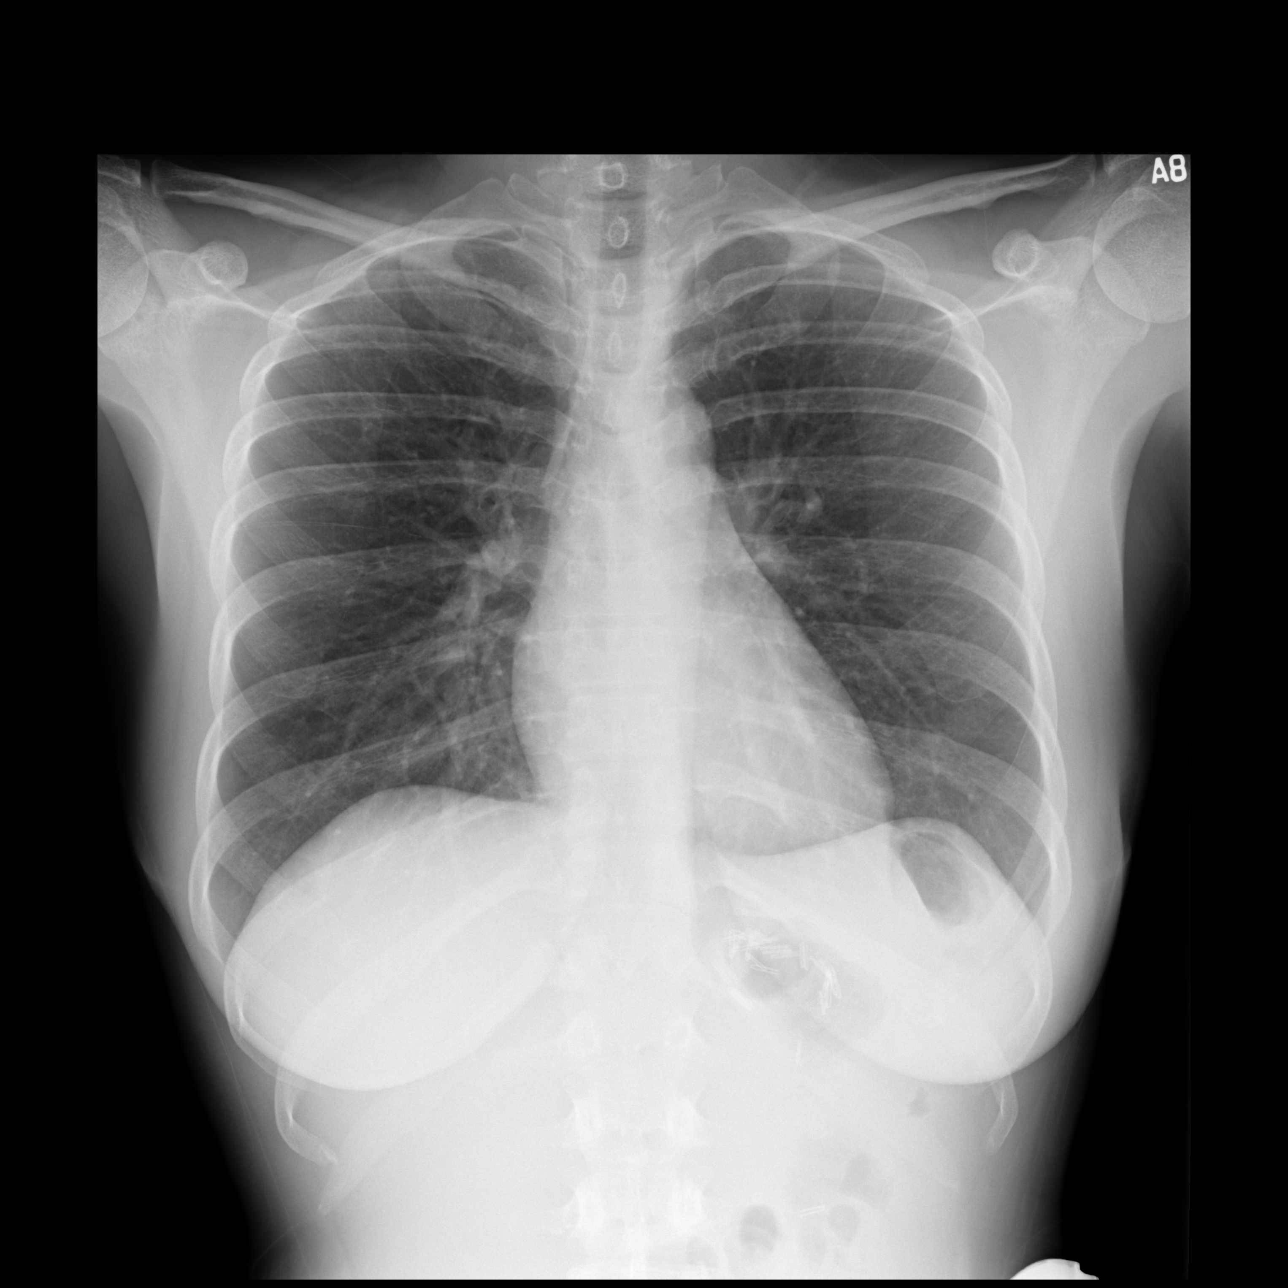

[dg abd acute w/chest (2 of 3)]
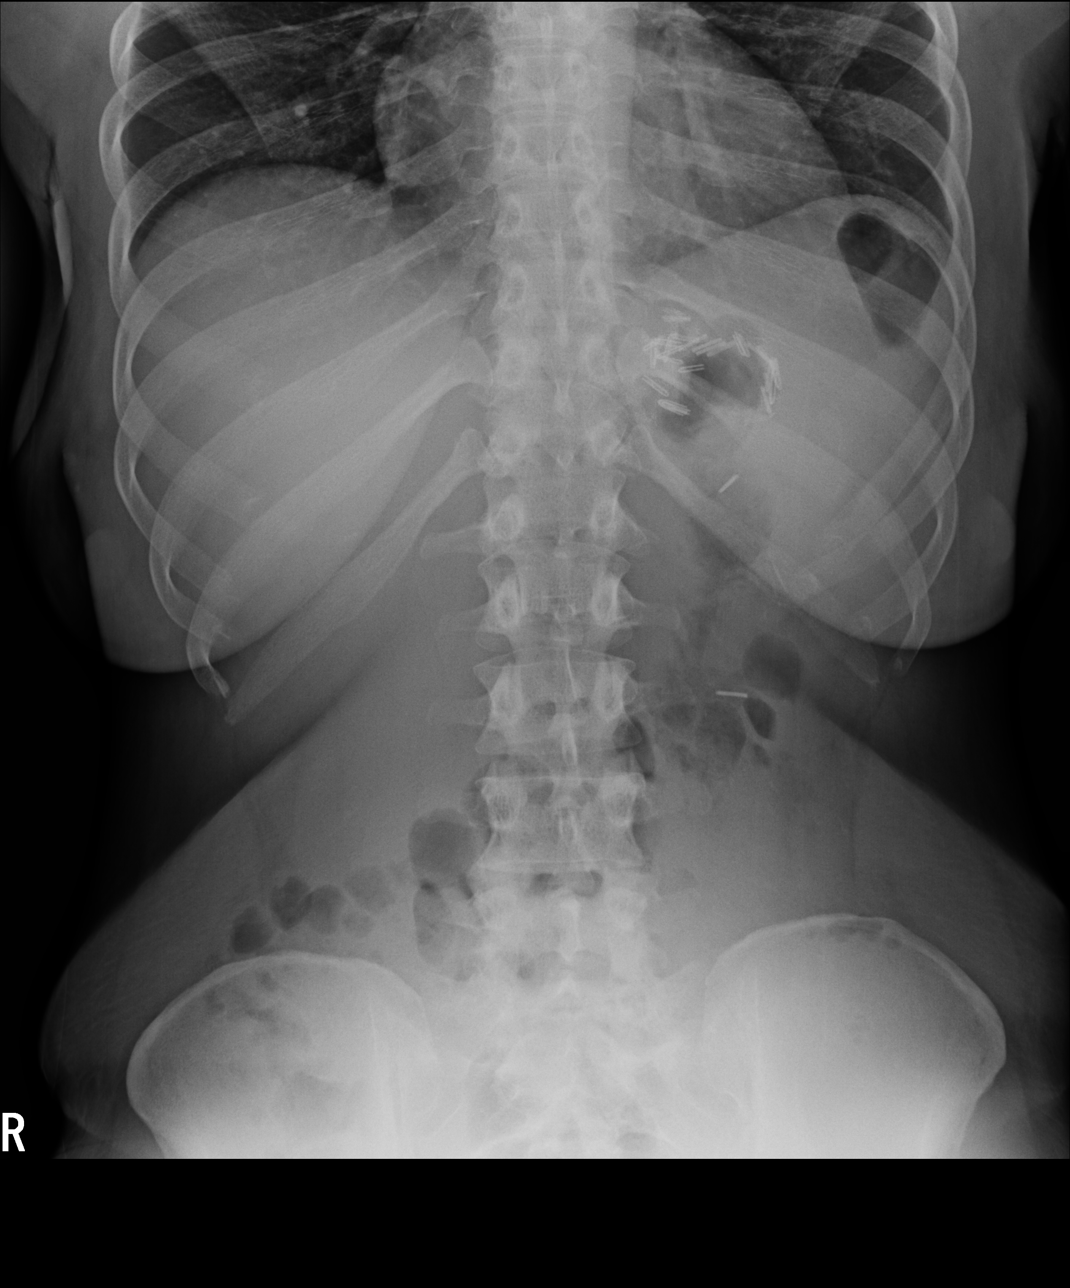

[dg abd acute w/chest (3 of 3)]
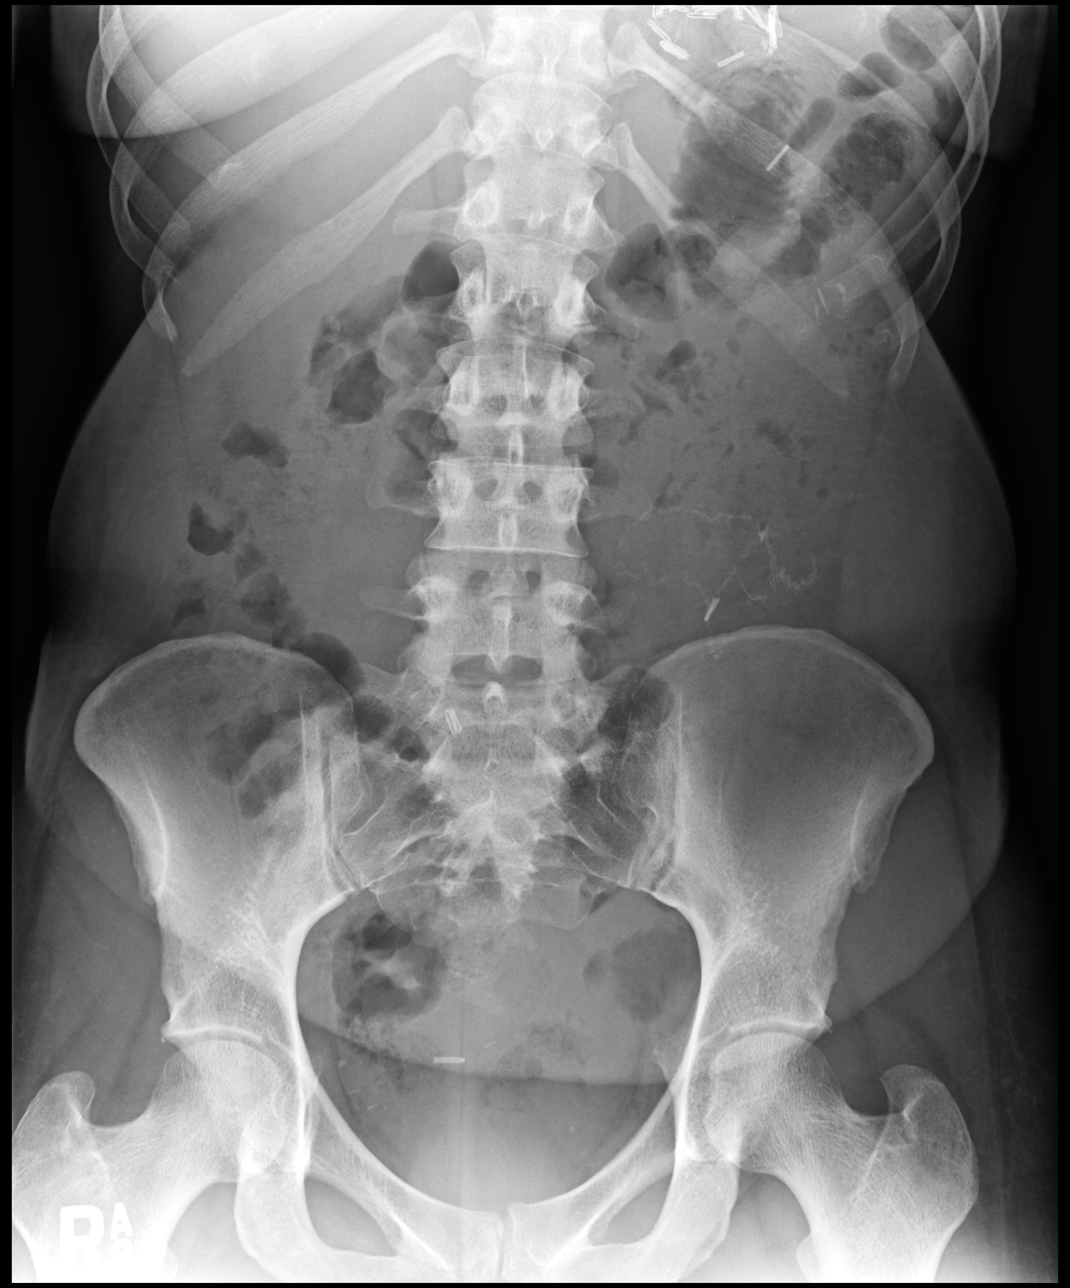

[3 of 3 positions shown; findings below may reference images not displayed]

FINDINGS: No active infiltrate or effusion is seen. Mediastinal and hilar
contours are unremarkable. The heart is within normal limits in
size. Multiple surgical clips are noted in the left upper quadrant.

Supine and erect views the abdomen show no bowel obstruction. No
free air is seen on the erect view. Multiple surgical clips are
present within the left upper quadrant with sutures in the left
lower abdomen as well. No opaque calculi are noted.
IMPRESSION: 1. No active lung disease.
2. No bowel obstruction.  No free air.

## 2017-08-24 ENCOUNTER — Ambulatory Visit: Payer: BLUE CROSS/BLUE SHIELD | Admitting: Internal Medicine

## 2017-08-24 ENCOUNTER — Encounter: Payer: Self-pay | Admitting: Internal Medicine

## 2017-08-24 VITALS — BP 122/80 | HR 78 | Resp 12 | Ht 61.0 in | Wt 124.0 lb

## 2017-08-24 DIAGNOSIS — G8929 Other chronic pain: Secondary | ICD-10-CM

## 2017-08-24 DIAGNOSIS — R102 Pelvic and perineal pain: Secondary | ICD-10-CM

## 2017-08-24 DIAGNOSIS — Z23 Encounter for immunization: Secondary | ICD-10-CM

## 2017-08-24 DIAGNOSIS — B009 Herpesviral infection, unspecified: Secondary | ICD-10-CM

## 2017-08-24 MED ORDER — DULOXETINE HCL 60 MG PO CPEP: ORAL_CAPSULE | ORAL | 11 refills | Status: DC

## 2017-08-24 NOTE — Progress Notes (Signed)
   Subjective:    Patient ID: Stacy Jackson, female    DOB: [birth year], 36 y.o.   MRN: [id number]  HPI   Chronic abdominal pain:  No change yet as expected. Initiated on Cymbalta 30 mg day after last visit.  Taking at 5 p.m.  No problems with sedation. No suicidal ideation.   She is fine with increasing the dose to 60 mg Discussed abdominal Xrays--I apparently wrote Left abdominal pain on the order and in her chart--made the change to the correct Right Lower abdominal pain.  Herpetic lesions, left shoulder blade:  Healing on suppressive dose of Valacyclovir now.  Current Meds  Medication Sig  . dicyclomine (BENTYL) 10 MG capsule Take 1 capsule (10 mg total) by mouth every 12 (twelve) hours as needed for spasms. As needed for abdominal pain  . DULoxetine (CYMBALTA) 60 MG capsule 1 cap by mouth daily  . famotidine (PEPCID) 20 MG tablet 3 tabs by mouth 1 hour before breakfast daily  . gabapentin (NEURONTIN) 400 MG capsule 1 cap by mouth three times daily  . HYDROcodone-acetaminophen (NORCO/VICODIN) 5-325 MG tablet Take 1 tablet by mouth every 6 (six) hours as needed for moderate pain.  . valACYclovir (VALTREX) 500 MG tablet 1 tab by mouth twice daily for 3 days, then 1 tab daily thereafter  . [DISCONTINUED] DULoxetine (CYMBALTA) 30 MG capsule Take 1 capsule (30 mg total) by mouth daily.    Allergies  Allergen Reactions  . Shrimp [Shellfish Allergy] Anaphylaxis    Review of Systems     Objective:   Physical Exam NAD Left shoulder with scabbed over lesions.  No erythema       Assessment & Plan:  1. Chronic pelvic/abdominal pain:  Adding Cymbalta to regimin. Tolerating fine thus far.  Discussed may not note a difference for 4-6 weeks.  2.  Herpetic lesions, Left shoulder:  Continue with suppressive doses of Valtrex.  3.  HM:  Influenza vaccine.

## 2017-08-30 ENCOUNTER — Encounter: Payer: Self-pay | Admitting: Internal Medicine

## 2017-09-19 DIAGNOSIS — B009 Herpesviral infection, unspecified: Secondary | ICD-10-CM | POA: Insufficient documentation

## 2017-09-28 ENCOUNTER — Ambulatory Visit: Payer: BLUE CROSS/BLUE SHIELD | Admitting: Internal Medicine

## 2017-09-28 ENCOUNTER — Encounter: Payer: Self-pay | Admitting: Internal Medicine

## 2017-09-28 VITALS — BP 122/80 | HR 80 | Resp 12 | Ht 61.0 in | Wt 130.0 lb

## 2017-09-28 DIAGNOSIS — R569 Unspecified convulsions: Secondary | ICD-10-CM

## 2017-09-28 DIAGNOSIS — G8929 Other chronic pain: Secondary | ICD-10-CM | POA: Diagnosis not present

## 2017-09-28 DIAGNOSIS — R102 Pelvic and perineal pain: Secondary | ICD-10-CM | POA: Diagnosis not present

## 2017-09-28 DIAGNOSIS — B009 Herpesviral infection, unspecified: Secondary | ICD-10-CM

## 2017-09-28 MED ORDER — HYDROCODONE-ACETAMINOPHEN 5-325 MG PO TABS: 1.0000 | ORAL_TABLET | Freq: Four times a day (QID) | ORAL | 0 refills | Status: DC | PRN
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
## 2017-10-01 ENCOUNTER — Encounter: Payer: Self-pay | Admitting: Internal Medicine

## 2017-10-03 ENCOUNTER — Telehealth: Payer: Self-pay

## 2017-10-03 NOTE — Telephone Encounter (Signed)
This was discussed in office.  Her lower abdomen and pelvis are basically the same.  If they see people for abdominal pain, then we can call it that.  Please call the pain clinic and find out as discussed in office today.  No need to send this to me in the future.  Write a brief summary of discussion and go ahead with what was decided please.

## 2017-10-03 NOTE — Telephone Encounter (Signed)
Stacy Jackson called stating pain clinic states they do not see patients for pelvic pain. Stacy Jackson wants to know why Dr. Amil Amen put pelvic pain as diagnosis. States she is having abdominal pain.   To Dr. Amil Amen to advise.

## 2017-10-12 ENCOUNTER — Ambulatory Visit: Payer: BLUE CROSS/BLUE SHIELD | Admitting: Diagnostic Neuroimaging

## 2017-10-17 NOTE — Telephone Encounter (Signed)
Spoke with Dr. Elwyn Lade office states we can resubmit referral with Dx of chronic abdominal pain and they may be able to see her.   To Dr. Amil Amen to resend referral

## 2017-10-19 ENCOUNTER — Other Ambulatory Visit: Payer: Self-pay | Admitting: Internal Medicine

## 2017-10-19 DIAGNOSIS — G8929 Other chronic pain: Secondary | ICD-10-CM

## 2017-10-19 DIAGNOSIS — R1031 Right lower quadrant pain: Principal | ICD-10-CM

## 2017-10-19 NOTE — Progress Notes (Signed)
Referral for pain clinic requesting change to chronic right lower quadrant abdominal pain

## 2017-10-19 NOTE — Telephone Encounter (Signed)
Order written

## 2017-10-20 NOTE — Telephone Encounter (Signed)
Referral was received by Dr. Elwyn Lade office. Sent to Dr. Holley Raring for review. Informed patient referral was sent again and his office will contact her if they can or cannot see her. Patient verbalized understanding.

## 2017-10-25 ENCOUNTER — Ambulatory Visit: Payer: BLUE CROSS/BLUE SHIELD | Admitting: Diagnostic Neuroimaging

## 2017-10-25 ENCOUNTER — Encounter: Payer: Self-pay | Admitting: Diagnostic Neuroimaging

## 2017-10-25 VITALS — BP 116/79 | HR 88 | Wt 133.6 lb

## 2017-10-25 DIAGNOSIS — R4689 Other symptoms and signs involving appearance and behavior: Secondary | ICD-10-CM | POA: Diagnosis not present

## 2017-10-25 NOTE — Patient Instructions (Addendum)
  ABNORMAL SPELLS - taper off levetiracetam --> 500mg  daily x 2 weeks then stop   HEADACHES - after 1 month, taper off topiramate --> 50mg  x 2 weeks, then stop - other treatments:  Cool Compress. Lie down and place a cool compress on your head.   Avoid headache triggers. If certain foods or odors seem to have triggered your migraines in the past, avoid them. A headache diary might help you identify triggers.   Include physical activity in your daily routine.   Manage stress. Find healthy ways to cope with the stressors, such as delegating tasks on your to-do list.   Practice relaxation techniques. Try deep breathing, yoga, massage and visualization.   Eat regularly. Eating regularly scheduled meals and maintaining a healthy diet might help prevent headaches. Also, drink plenty of fluids.   Follow a regular sleep schedule. Sleep deprivation might contribute to headaches  Consider biofeedback. With this mind-body technique, you learn to control certain bodily functions - such as muscle tension, heart rate and blood pressure - to prevent headaches or reduce headache pain.

## 2017-10-25 NOTE — Progress Notes (Signed)
[facility]  PATIENT: Stacy Jackson DOB: 06/08/1981  REFERRING CLINICIAN: Jacqualine Code, MD HISTORY FROM: patient and chart review REASON FOR VISIT: follow up    HISTORICAL  CHIEF COMPLAINT:  Chief Complaint  Patient presents with  . Spell of abnl behavior    rm 6, "4 spells since Sept but not as bad as before"  . 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 Patient has had a few since last visit, usually triggered by long bouts of abd pain.  Had spell in [place] in Dec 2018, and was started on levetiracetam.   Tolerating TPX for headache, but not much benefit. No alleviating or aggravating factors.  PRIOR HPI (06/01/17): 37 year old female here for evaluation of seizures. In 2008 patient had onset of intermittent episodes of tremors, disconnected feeling, slow responses, lasting 1-2 minutes. Patient was having these every few weeks or every few months. They tend to occur in the evening time. These continued for several years. Around 2011 patient was diagnosed with possible seizure disorder. She was started on medications including Depakote and Trileptal which seemed to help initially. By 2012 she stopped having these episodes which were possible seizures. In July 2018 patient was at church, when all of a sudden she had trouble seeing with blurred vision, and had jittery tremulous movements. She was taken to local hospital and then transferred to a larger [facility] in [place]. Patient was observed to have abnormal movements, but apparently was able to respond and follow commands. 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 She had MRI of the brain and EEG testing which were unremarkable. She was advised to stop tramadol and follow-up with a local neurologist. I reviewed records from this evaluation  myself, which were scanned into the media tab of our EMR. Patient continues to have other neurologic symptoms including right-sided numbness, right-sided weakness, slurred speech, word finding difficulties. She also reports significant insomnia and fatigue. In addition patient has had intermittent episodes of headache, with right-sided throbbing pain, nausea, photophobia. These recurred 1-2 times per month. Sometimes she sees black spots or vision changes with these headaches. Patient denies any history of head trauma, meningitis or encephalitis. She denies any overt causes of stress, anxiety or depression. She says that she tends to be the one in her family who listens to others who have problems. She did report seeing a therapist or counselor in the past for some reason but patient did not further specified. Patient also has history of chronic abdominal pain, which has not clearly been diagnosed with a specific cause.   REVIEW OF SYSTEMS: Full 14 system review of systems performed and negative with exception of: headache spells abd pain nausea.    ALLERGIES: Allergies  Allergen Reactions  . Shrimp [Shellfish Allergy] Anaphylaxis    HOME MEDICATIONS: Outpatient Medications Prior to Visit  Medication Sig Dispense Refill  . dicyclomine (BENTYL) 10 MG capsule Take 1 capsule (10 mg total) by mouth every 12 (twelve) hours as needed for spasms. As needed for abdominal pain 60 capsule 4  . DULoxetine (CYMBALTA) 60 MG capsule 1 cap by mouth daily 30 capsule 11  . famotidine (PEPCID) 20 MG tablet 3 tabs by mouth 1 hour before breakfast daily 90 tablet 6  . gabapentin (NEURONTIN) 400 MG capsule 1 cap by mouth three times daily 90  capsule 11  . HYDROcodone-acetaminophen (NORCO/VICODIN) 5-325 MG tablet Take 1 tablet by mouth every 6 (six) hours as needed for moderate pain. 30 tablet 0  . levETIRAcetam (KEPPRA) 500 MG tablet 2 (two) times daily.   0  . topiramate (TOPAMAX) 50 MG tablet Take 1 tablet (50 mg  total) by mouth 2 (two) times daily. 60 tablet 12  . valACYclovir (VALTREX) 500 MG tablet 1 tab by mouth twice daily for 3 days, then 1 tab daily thereafter 33 tablet 11  . fluconazole (DIFLUCAN) 150 MG tablet 1 tab by mouth daily for 3 days (Patient not taking: Reported on 08/17/2017) 3 tablet 0   No facility-administered medications prior to visit.     PAST MEDICAL HISTORY: Past Medical History:  Diagnosis Date  . Cervical cancer ([place]) 2015   Unsuccessful LEEP, then TAH  . 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 Complicated migraine 3235   Left arm numbness, states she may have had a "mini-stroke"  . Diverticulosis 02/15/2017  . Hiatal hernia with gastroesophageal reflux 5732   Fundoplication in 2025  . History of obesity 2010  . Ovarian cyst right   2008    PAST SURGICAL HISTORY: Past Surgical History:  Procedure Laterality Date  . ABDOMINAL HYSTERECTOMY  2015   For cervical cancer unresponsive to LEEP  . CESAREAN SECTION  2003, 2008  . COLONOSCOPY WITH PROPOFOL N/A 11/22/2016   Procedure: COLONOSCOPY WITH PROPOFOL;  Surgeon: Arta Silence, MD;  Location: [facility];  Service: Endoscopy;  Laterality: N/A;  . ESOPHAGOGASTRODUODENOSCOPY  01/13/2015   Normal biopsy of gastric mucosa/gastric pouch  . NISSEN FUNDOPLICATION  4270  . OOPHORECTOMY Right 2008   Done at same time of Csection  . ROUX-EN-Y GASTRIC BYPASS  ?with Nissen fundoplication   Unknown year--[facility] as [facility]ariatric procedure.    FAMILY HISTORY: Family History  Problem Relation Age of Onset  . Renal cancer Mother   . Hypertension Mother   . Ulcerative colitis Father   . Diabetes Father   . Hypertension Father   . Diabetes Brother   . Hypertension Brother   . Asthma Brother   . Sickle cell trait Daughter   . Eczema Son   . Autism spectrum disorder Son   . Asthma Brother   .  Eczema Brother   . Breast cancer Maternal Grandmother     SOCIAL HISTORY:  Social History   Socioeconomic History  . Marital status: Single    Spouse name: Not on file  . Number of children: 2  . Years of education: 63  . Highest education level: Not on file  Social Needs  . Financial resource strain: Not on file  . Food insecurity - worry: Not on file  . Food insecurity - inability: Not on file  . Transportation needs - medical: Not on file  . Transportation needs - non-medical: Not on file  Occupational History  . Occupation: Operations at a [employer]  Tobacco Use  . Smoking status: Never Smoker  . Smokeless tobacco: Never Used  Substance and Sexual Activity  . Alcohol use: No    Alcohol/week: 0.0 oz  . Drug use: No  . Sexual activity: Not Currently  Other Topics Concern  . Not on file  Social History Narrative   Lives at home with her 2 kids.     Children have different fathers--both involved in her children's lives  Discusses her children needed to get set up with school when home fell through in [place], so stayed with her parents until she could get her feet under her --eventually moved to [place] to be closer to them [facility])     PHYSICAL EXAM  GENERAL EXAM/CONSTITUTIONAL: Vitals:  Vitals:   10/25/17 1053  BP: 116/79  Pulse: 88  Weight: 133 lb 9.6 oz (60.6 kg)   Body mass index is 25.24 kg/m. No exam data present  Patient is in no distress; well developed, nourished and groomed; neck is supple  CARDIOVASCULAR:  Examination of carotid arteries is normal; no carotid bruits  Regular rate and rhythm, no murmurs  Examination of peripheral vascular system by observation and palpation is normal  EYES:  Ophthalmoscopic exam of optic discs and posterior segments is normal; no papilledema or hemorrhages  MUSCULOSKELETAL:  Gait, strength, tone, movements noted in Neurologic exam below  NEUROLOGIC: MENTAL STATUS:  No flowsheet data  found.  awake, alert, oriented to person, place and time  recent and remote memory intact  normal attention and concentration  language fluent, comprehension intact, naming intact,   fund of knowledge appropriate  CRANIAL NERVE:   2nd - no papilledema on fundoscopic exam  2nd, 3rd, 4th, 6th - pupils equal and reactive to light, visual fields full to confrontation, extraocular muscles intact, no nystagmus  5th - facial sensation symmetric  7th - facial strength symmetric  8th - hearing intact  9th - palate elevates symmetrically, uvula midline  11th - shoulder shrug symmetric  12th - tongue protrusion midline  MOTOR:   normal bulk and tone, full strength in the BUE, BLE  SENSORY:   normal and symmetric to light touch, temperature, vibration  COORDINATION:   finger-nose-finger, fine finger movements normal  REFLEXES:   deep tendon reflexes present and symmetric  GAIT/STATION:   narrow based gait; able to walk tandem; romberg is negative    DIAGNOSTIC DATA (LABS, IMAGING, TESTING) - I reviewed patient records, labs, notes, testing and imaging myself where available.  Lab Results  Component Value Date   WBC 4.9 08/17/2017   HGB 11.1 08/17/2017   HCT 34.0 08/17/2017   MCV 78 (L) 08/17/2017   PLT 289 08/17/2017      Component Value Date/Time   NA 140 08/17/2017 1240   K 5.0 08/17/2017 1240   CL 104 08/17/2017 1240   CO2 24 08/17/2017 1240   GLUCOSE 74 08/17/2017 1240   BUN 9 08/17/2017 1240   CREATININE 0.66 08/17/2017 1240   CALCIUM 8.8 08/17/2017 1240   PROT 6.5 08/17/2017 1240   ALBUMIN 4.3 08/17/2017 1240   AST 15 08/17/2017 1240   ALT 12 08/17/2017 1240   ALKPHOS 58 08/17/2017 1240   BILITOT 0.2 08/17/2017 1240   GFRNONAA 114 08/17/2017 1240   GFRAA 131 08/17/2017 1240   Lab Results  Component Value Date   CHOL 167 06/24/2016   HDL 79 06/24/2016   LDLCALC 81 06/24/2016   TRIG 33 06/24/2016   No results found for: HGBA1C No  results found for: VITAMINB12 No results found for: TSH   July 2018 MRI brain ([facility]; [place]) - negative  July 2018 EEG ([facility]; [place]) - normal     ASSESSMENT AND PLAN  37 y.o. year old female here with History of seizure disorder since 2008, treated with antiseizure medications until 2012, with recurrence of abnormal spells in July 2018 and Dec 2018. 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   Dx:  1. Spell  of abnormal behavior      PLAN:  I spent 20 minutes of face to face time with patient. Greater than 50% of time was spent in counseling and coordination of care with patient. In summary we discussed:   ABNORMAL SPELLS (non-epileptic spells; may be related to chronic abdominal pain) - video EEG at [facility] was normal - no anti-seizure meds for now (taper off levetiracetam --> started in [place] in Dec 2018) - According to [facility] law, you can not drive unless you are seizure / syncope free for at least 6 months and under physician's care.  - Please maintain precautions. Do not participate in activities where a loss of awareness could harm you or someone else. No swimming alone, no tub bathing, no hot tubs, no driving, no operating motorized vehicles (cars, ATVs, motocycles, etc), lawnmowers, power tools or firearms. No standing at heights, such as rooftops, ladders or stairs. Avoid hot objects such as stoves, heaters, open fires. Wear a helmet when riding a bicycle, scooter, skateboard, etc. and avoid areas of traffic. Set your water heater to 120 degrees or less.  MIGRAINE WITH AURA - taper off topiramate for migraine prevention; not effective - OTC meds for migraine rescue  Cool Compress. Lie down and place a cool compress on your head.   Avoid headache triggers. If certain foods or odors seem to have triggered your migraines in the past, avoid them. A headache diary might help you identify triggers.   Include physical activity in your daily routine.   Manage  stress. Find healthy ways to cope with the stressors, such as delegating tasks on your to-do list.   Practice relaxation techniques. Try deep breathing, yoga, massage and visualization.   Eat regularly. Eating regularly scheduled meals and maintaining a healthy diet might help prevent headaches. Also, drink plenty of fluids.   Follow a regular sleep schedule. Sleep deprivation might contribute to headaches  Consider biofeedback. With this mind-body technique, you learn to control certain bodily functions - such as muscle tension, heart rate and blood pressure - to prevent headaches or reduce headache pain.  INSOMNIA / ANXIETY - follow up with therapist or counselor  Return in about 6 months (around 04/24/2018).   Penni Bombard, MD 2/0/1007, 12:19 AM Certified in Neurology, Neurophysiology and Neuroimaging  [facility] [address], [place], [place] [address] [phone]

## 2017-11-07 ENCOUNTER — Other Ambulatory Visit: Payer: Self-pay | Admitting: Internal Medicine

## 2017-11-07 DIAGNOSIS — G8929 Other chronic pain: Secondary | ICD-10-CM

## 2017-11-07 DIAGNOSIS — R1031 Right lower quadrant pain: Principal | ICD-10-CM

## 2017-11-07 NOTE — Progress Notes (Unsigned)
Spoke with [facility].  They see chronic pain of all types and would see her for chronic right lower quadrant pain  Stacy Jackson, could you also call and see if her abdominal pain is ever associated with a migraine headache as well?

## 2017-11-08 NOTE — Progress Notes (Signed)
Spoke with patient states sometimes. States most of the time her stomach starts hurting and because of the stomach pain it causes her head to hurt. States she was informed from neurologist that her seizures or seizure like activity is caused by her abdominal pain.patient is aware [facility] will be contacting her for appointment

## 2017-11-08 NOTE — Progress Notes (Signed)
Referral, OV notes,demographics and insurance card faxed to [facility]. Facility will contact patient to schedule appointment.

## 2017-11-08 NOTE — Progress Notes (Signed)
Left message for patient to call back  

## 2017-11-08 NOTE — Progress Notes (Signed)
Referral, OV notes, demographics and insurance faxed to [facility]. Facility will contact patient and schedule appointment.

## 2017-11-22 ENCOUNTER — Encounter: Payer: Self-pay | Admitting: Internal Medicine

## 2017-11-22 ENCOUNTER — Ambulatory Visit: Payer: BLUE CROSS/BLUE SHIELD | Admitting: Internal Medicine

## 2017-11-22 VITALS — BP 122/82 | HR 60 | Temp 97.6°F | Resp 12 | Ht 61.0 in | Wt 128.0 lb

## 2017-11-22 DIAGNOSIS — J019 Acute sinusitis, unspecified: Secondary | ICD-10-CM | POA: Diagnosis not present

## 2017-11-22 DIAGNOSIS — R102 Pelvic and perineal pain: Secondary | ICD-10-CM | POA: Diagnosis not present

## 2017-11-22 DIAGNOSIS — G8929 Other chronic pain: Secondary | ICD-10-CM

## 2017-11-22 DIAGNOSIS — J302 Other seasonal allergic rhinitis: Secondary | ICD-10-CM | POA: Diagnosis not present

## 2017-11-22 MED ORDER — AZITHROMYCIN 500 MG PO TABS: ORAL_TABLET | ORAL | 0 refills | Status: DC

## 2017-11-22 MED ORDER — FEXOFENADINE HCL 180 MG PO TABS: ORAL_TABLET | ORAL | 11 refills | Status: AC

## 2017-11-22 MED ORDER — HYDROCODONE-ACETAMINOPHEN 5-325 MG PO TABS: 1.0000 | ORAL_TABLET | Freq: Four times a day (QID) | ORAL | 0 refills | Status: DC | PRN

## 2017-11-22 NOTE — Patient Instructions (Signed)
Drink  lots of water

## 2017-11-22 NOTE — Progress Notes (Signed)
   Subjective:    Patient ID: Stacy Jackson, female    DOB: April 12, 1981, 36 y.o.   MRN: [id number]  HPI   3 weeks ago, started with runny nose, coughing and sore throat.  Week 2 began to cough up clear mucous and then changed to green, then brown mucous.   No definite sinus pressure.  Did have drainage in posterior pharynx.   Not clear she has dyspnea.   No fevers. She is having sneezing and mildly itchy eyes and throat as well.    Review of Systems     Objective:   Physical Exam  NAD HEENT:  PERRL EOMI, TMs pearly gray, throat without injection. NT over frontal and maxillary sinuses. Neck:  Supple, No adenopathy Chest:  CTA CV:  RRR without murmur or rub, radial pulses normal and equal       Assessment & Plan:  1.  Sinusitis.  Azithromycin 500 mg daily for 5 days.  Saline nasal spray.    2.  Allergies:  Fexofenadine 180 mg daily as needed.  3.  Chronic right abdominal/pelvic pain:  Has not been able to get into pain clinic yet.  Would like an extension of Hydrocodone.  Discussed would send in a small amount.  15 tabs only

## 2018-01-09 ENCOUNTER — Ambulatory Visit: Payer: BLUE CROSS/BLUE SHIELD | Admitting: Internal Medicine

## 2018-01-09 ENCOUNTER — Encounter: Payer: Self-pay | Admitting: Internal Medicine

## 2018-01-09 VITALS — BP 140/80 | HR 70 | Resp 12 | Ht 61.0 in | Wt 127.0 lb

## 2018-01-09 DIAGNOSIS — R569 Unspecified convulsions: Secondary | ICD-10-CM

## 2018-01-09 DIAGNOSIS — L989 Disorder of the skin and subcutaneous tissue, unspecified: Secondary | ICD-10-CM

## 2018-01-09 DIAGNOSIS — B009 Herpesviral infection, unspecified: Secondary | ICD-10-CM | POA: Diagnosis not present

## 2018-01-09 DIAGNOSIS — G8929 Other chronic pain: Secondary | ICD-10-CM

## 2018-01-09 DIAGNOSIS — R1031 Right lower quadrant pain: Secondary | ICD-10-CM | POA: Diagnosis not present

## 2018-01-09 MED ORDER — MELOXICAM 15 MG PO TABS: 15.0000 mg | ORAL_TABLET | Freq: Every day | ORAL | 4 refills | Status: DC

## 2018-01-09 NOTE — Progress Notes (Signed)
Subjective:    Patient ID: Stacy Jackson, female    DOB: 18-Feb-1981, 37 y.o.   MRN: [id number]  HPI   1.  Episodes: Dr. Leta Baptist noted Negative video EEG at [facility].  Recommended tapering off anti seizure medication.  Patient did not feel there was much in way of treatment with Dr. Leta Baptist and went back to the Neurologist in [place], [place].   Patient states she was not aware Dr. Leta Baptist did not feel her episodes were non epileptic.   She is back on Keppra 1000 mg twice daily with her Neuologist in [place].  Apparently, is set up for a 3 day EEG at home study.   Dr. Dorothyann Gibbs.    2. Chronic Right low abdominal pain:  Has not been able to get into pain clinic here--not clear why this is not going through.  Would like to go back to [facility].   Not clear she is using the cymbalta regularly.  Using Dicyclomine 2-3 times weekly.  Not clear it is helping.  3.  Left shoulder lesions, felt to be herpetic in nature.  Is not having the florid outbreak, but does get a "lump"  States also having more "scabbing"  Anywhere on her body--not just in the dermatome overlying her left scapula where lesions have been in past.  Denies itching or scratching of the areas that are scabbing.  4.  Has something that was itching on her left lateral ankle--"super dark"  Now, had another lesion on her left leg also that was red--took a picture, but not crisp--perhaps a ringlet of tiny ulcers, though again not a clear picture.  Current Meds  Medication Sig  . dicyclomine (BENTYL) 10 MG capsule Take 1 capsule (10 mg total) by mouth every 12 (twelve) hours as needed for spasms. As needed for abdominal pain  . DULoxetine (CYMBALTA) 60 MG capsule 1 cap by mouth daily  . famotidine (PEPCID) 20 MG tablet 3 tabs by mouth 1 hour before breakfast daily  . fexofenadine (ALLEGRA) 180 MG tablet 1 tab by mouth daily as needed  . gabapentin (NEURONTIN) 400 MG capsule 1 cap by mouth three times daily  . levETIRAcetam (KEPPRA) 1000 MG tablet  Take 1,000 mg by mouth 2 (two) times daily.  . valACYclovir (VALTREX) 500 MG tablet 1 tab by mouth twice daily for 3 days, then 1 tab daily thereafter   Allergies  Allergen Reactions  . Shrimp [Shellfish Allergy] Anaphylaxis   Review of Systems     Objective:   Physical Exam  Skin:  Scratch marks on dry skin of left shoulder.  Only hyperpigmented nonpalpable lesions from previous inflammation over left shoulder area (most consistent with C4 dermatome) Hyperpigmented thickened skin over lateral malleolus, but no acute lesion No lesion of right posterior low thigh where red lesion was last week. Lungs:  CTA CV:  RRR without murmur or rub, radial pulses normal and equal Abd:  S, + BS, No HSM or mass.  States tenderness in RLQ, but do not see her responding much with palpation here. Neuro:  Normal and unchanged.       Assessment & Plan:  1.  Chronic right lower abdominal pain:  See previous visits and attempts to refer for chronic pain management.   Will write for Meloxicam.   Needs pain clinic for narcotic medication. Patient now saying her "episodes"  Seem to occur with increased abdominal pain. Has never considered abdominal migraine equivalent before.   Topamax in past did not help prevent migraines.  Is not on anything for treatment of migraines currently, but states is not really having the headache portion frequently as has had in the past.  2.  "Episodes"  Not felt to be seizures by Neurology here.  [facility]  3.  Skin concerns:  Return if another acute lesion on legs--unable to say what these may have been or if related.  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

## 2018-01-16 ENCOUNTER — Encounter

## 2018-01-16 ENCOUNTER — Ambulatory Visit: Payer: BLUE CROSS/BLUE SHIELD | Admitting: Diagnostic Neuroimaging

## 2018-02-01 NOTE — Progress Notes (Signed)
Referral, demographics, OV notes and insurance information faxed to [facility]. They will review information and contact patient to schedule appointment.

## 2018-02-05 ENCOUNTER — Other Ambulatory Visit: Payer: Self-pay | Admitting: Internal Medicine

## 2018-04-07 ENCOUNTER — Other Ambulatory Visit: Payer: Self-pay | Admitting: Internal Medicine

## 2018-04-25 ENCOUNTER — Ambulatory Visit: Payer: BLUE CROSS/BLUE SHIELD | Admitting: Diagnostic Neuroimaging

## 2018-06-01 ENCOUNTER — Other Ambulatory Visit: Payer: Self-pay | Admitting: Internal Medicine

## 2018-06-08 ENCOUNTER — Other Ambulatory Visit: Payer: Self-pay | Admitting: Internal Medicine

## 2018-06-21 ENCOUNTER — Encounter: Payer: Self-pay | Admitting: Internal Medicine

## 2018-06-21 ENCOUNTER — Ambulatory Visit: Payer: BLUE CROSS/BLUE SHIELD | Admitting: Internal Medicine

## 2018-06-21 VITALS — BP 118/80 | HR 66 | Temp 98.0°F | Resp 12 | Ht 61.0 in | Wt 121.0 lb

## 2018-06-21 DIAGNOSIS — J0100 Acute maxillary sinusitis, unspecified: Secondary | ICD-10-CM | POA: Diagnosis not present

## 2018-06-21 DIAGNOSIS — R1031 Right lower quadrant pain: Secondary | ICD-10-CM

## 2018-06-21 DIAGNOSIS — G8929 Other chronic pain: Secondary | ICD-10-CM

## 2018-06-21 MED ORDER — HYDROCODONE-ACETAMINOPHEN 5-325 MG PO TABS: 1.0000 | ORAL_TABLET | Freq: Four times a day (QID) | ORAL | 0 refills | Status: DC | PRN

## 2018-06-21 MED ORDER — AMOXICILLIN-POT CLAVULANATE 875-125 MG PO TABS: 1.0000 | ORAL_TABLET | Freq: Two times a day (BID) | ORAL | 0 refills | Status: DC

## 2018-06-21 NOTE — Progress Notes (Signed)
   Subjective:    Patient ID: Stacy Jackson, female    DOB: 01-29-1981, 37 y.o.   MRN: [id number]  HPI   Started with cough, head congestion and bodyaches for 3 weeks.  Started out worse and then gradually improved.   Past 4 days, seems to be worsening again. Her cough is worse with dark sputum production.  Nose with bleeding.  Sputum with bad taste. Did have a fever mid way through, but does not feel she has had one for a few days. Vacillating from feeling very cold to breaking out in sweats.   Some dyspnea.   She does have drainage down back of throat. Some sinus pressure and sinus headache pain in last few days.   Has some pressure in ears. Has tried OTC sinus meds, Theraflu, Mucinex.  None really helped. Alternating constipation and loose stools.  Current Meds  Medication Sig  . dicyclomine (BENTYL) 10 MG capsule TAKE 1 CAPSULE BY MOUTH EVERY 12 HOURS AS NEEDED FOR SPASMS OR  ABDOMINAL  PAIN  . DULoxetine (CYMBALTA) 60 MG capsule 1 cap by mouth daily  . famotidine (PEPCID) 20 MG tablet 3 tabs by mouth 1 hour before breakfast daily  . fexofenadine (ALLEGRA) 180 MG tablet 1 tab by mouth daily as needed  . gabapentin (NEURONTIN) 400 MG capsule TAKE 1 CAPSULE BY MOUTH THREE TIMES DAILY  . levETIRAcetam (KEPPRA) 1000 MG tablet Take 1,000 mg by mouth 2 (two) times daily.  . meloxicam (MOBIC) 15 MG tablet TAKE 1 TABLET BY MOUTH DAILY  . valACYclovir (VALTREX) 500 MG tablet 1 tab by mouth twice daily for 3 days, then 1 tab daily thereafter    Allergies  Allergen Reactions  . Shrimp [Shellfish Allergy] Anaphylaxis      Review of Systems     Objective:   Physical Exam NAD HEENT:  PERRL, EOMI, TMs pearly gray, nasal mucosa with inflamed area at superior aspect of right nostril near opening of nostril.  Mild pharyngeal erythema.  No exudate. Tender only over maxillary sinuses Neck:  Supple, No adenopathy Chest:  CTA CV:  RRR without murmur or rub  Radial pulses normal and  equal Abd:  S, NT, No HSM or mass.       Assessment & Plan:  1.  Acute Sinusitis:  Augmentin 875/125 mg twice daily for 10 days. Neti pot daily. KY jelly or other water based lubricant for hydration of nostrils. Call if develops yeast infection.  2.  Chronic abdominal pain:  Patient brings this up at end of visit:  States her pain is so bad she is grinding her teeth at night.  Referral sent to Pain management at [facility].  Patient states she has not heard anything.  Spoke with CMA--patient ultimately given the clinic's phone number to call.   [name] states she believes she called in March 22, 2023.  Someone had died in the office recently and was the stated reason why she had not been called.  Someone was to assess her chart and get back to her soon afterward, but she never heard anything. Called again today:  [phone] and only able to leave a message to call me back via clinic or personal cell number regarding this. Referral faxed 02/01/18 Call received later from pain clinic.  They found the referral packet and will call patient and get set up.

## 2018-06-21 NOTE — Patient Instructions (Addendum)
Use your Neti pot and for right nostril something like KY jelly or gel that is water based--nothing petroleum based.

## 2018-07-11 ENCOUNTER — Ambulatory Visit: Payer: BLUE CROSS/BLUE SHIELD | Admitting: Internal Medicine

## 2018-07-11 ENCOUNTER — Encounter: Payer: Self-pay | Admitting: Internal Medicine

## 2018-07-11 VITALS — BP 130/90 | HR 80 | Resp 12 | Ht 61.0 in | Wt 123.0 lb

## 2018-07-11 DIAGNOSIS — R1031 Right lower quadrant pain: Secondary | ICD-10-CM | POA: Diagnosis not present

## 2018-07-11 DIAGNOSIS — B009 Herpesviral infection, unspecified: Secondary | ICD-10-CM

## 2018-07-11 DIAGNOSIS — D509 Iron deficiency anemia, unspecified: Secondary | ICD-10-CM | POA: Diagnosis not present

## 2018-07-11 DIAGNOSIS — G5621 Lesion of ulnar nerve, right upper limb: Secondary | ICD-10-CM

## 2018-07-11 DIAGNOSIS — Z79899 Other long term (current) drug therapy: Secondary | ICD-10-CM | POA: Diagnosis not present

## 2018-07-11 DIAGNOSIS — F439 Reaction to severe stress, unspecified: Secondary | ICD-10-CM

## 2018-07-11 DIAGNOSIS — R569 Unspecified convulsions: Secondary | ICD-10-CM

## 2018-07-11 DIAGNOSIS — F4310 Post-traumatic stress disorder, unspecified: Secondary | ICD-10-CM

## 2018-07-11 DIAGNOSIS — G8929 Other chronic pain: Secondary | ICD-10-CM

## 2018-07-11 MED ORDER — HYDROCODONE-ACETAMINOPHEN 5-325 MG PO TABS: 1.0000 | ORAL_TABLET | Freq: Four times a day (QID) | ORAL | 0 refills | Status: DC | PRN
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
## 2018-07-11 NOTE — Patient Instructions (Signed)
Use elbow pad (fluffy one) for volleyball when sleeping--watch for where you might be compressing inside of elbow when driving/elbows on chairs or tables, etc.

## 2018-07-12 LAB — CBC WITH DIFFERENTIAL/PLATELET
BASOS: 0 %
Basophils Absolute: 0 10*3/uL (ref 0.0–0.2)
EOS (ABSOLUTE): 0.1 10*3/uL (ref 0.0–0.4)
EOS: 1 %
HEMATOCRIT: 32.3 % — AB (ref 34.0–46.6)
HEMOGLOBIN: 10.3 g/dL — AB (ref 11.1–15.9)
Immature Grans (Abs): 0 10*3/uL (ref 0.0–0.1)
Immature Granulocytes: 0 %
LYMPHS ABS: 1.4 10*3/uL (ref 0.7–3.1)
Lymphs: 32 %
MCH: 26.3 pg — AB (ref 26.6–33.0)
MCHC: 31.9 g/dL (ref 31.5–35.7)
MCV: 82 fL (ref 79–97)
MONOCYTES: 8 %
Monocytes Absolute: 0.3 10*3/uL (ref 0.1–0.9)
NEUTROS ABS: 2.4 10*3/uL (ref 1.4–7.0)
Neutrophils: 59 %
Platelets: 282 10*3/uL (ref 150–450)
RBC: 3.92 x10E6/uL (ref 3.77–5.28)
RDW: 17.4 % — ABNORMAL HIGH (ref 12.3–15.4)
WBC: 4.2 10*3/uL (ref 3.4–10.8)

## 2018-07-12 LAB — COMPREHENSIVE METABOLIC PANEL
A/G RATIO: 2.2 (ref 1.2–2.2)
ALT: 12 IU/L (ref 0–32)
AST: 13 IU/L (ref 0–40)
Albumin: 4.2 g/dL (ref 3.5–5.5)
Alkaline Phosphatase: 55 IU/L (ref 39–117)
BUN/Creatinine Ratio: 20 (ref 9–23)
BUN: 13 mg/dL (ref 6–20)
Bilirubin Total: <0.2 mg/dL (ref 0.0–1.2)
CO2: 24 mmol/L (ref 20–29)
CREATININE: 0.64 mg/dL (ref 0.57–1.00)
Calcium: 8.6 mg/dL — ABNORMAL LOW (ref 8.7–10.2)
Chloride: 107 mmol/L — ABNORMAL HIGH (ref 96–106)
GFR, EST AFRICAN AMERICAN: 133 mL/min/{1.73_m2} (ref >59)
GFR, EST NON AFRICAN AMERICAN: 115 mL/min/{1.73_m2} (ref >59)
GLOBULIN, TOTAL: 1.9 g/dL (ref 1.5–4.5)
Glucose: 81 mg/dL (ref 65–99)
POTASSIUM: 4.9 mmol/L (ref 3.5–5.2)
Sodium: 143 mmol/L (ref 134–144)
Total Protein: 6.1 g/dL (ref 6.0–8.5)

## 2018-07-27 ENCOUNTER — Other Ambulatory Visit: Payer: Self-pay

## 2018-07-27 MED ORDER — VALACYCLOVIR HCL 500 MG PO TABS: ORAL_TABLET | ORAL | 5 refills | Status: DC

## 2018-07-27 MED ORDER — DICYCLOMINE HCL 10 MG PO CAPS: ORAL_CAPSULE | ORAL | 11 refills | Status: DC

## 2018-08-01 ENCOUNTER — Telehealth: Payer: Self-pay | Admitting: Internal Medicine

## 2018-08-01 NOTE — Telephone Encounter (Signed)
Spoke with patient. Rx had been refilled on 07/27/18. Patient verbalized understanding

## 2018-08-01 NOTE — Telephone Encounter (Signed)
Patient needs refill of  Dicyclomine (BENTYL) 10 mg capsule.

## 2018-08-07 ENCOUNTER — Other Ambulatory Visit: Payer: Self-pay | Admitting: Internal Medicine

## 2018-10-06 DIAGNOSIS — M940 Chondrocostal junction syndrome [Tietze]: Secondary | ICD-10-CM | POA: Insufficient documentation

## 2018-10-11 ENCOUNTER — Other Ambulatory Visit: Payer: Self-pay | Admitting: Internal Medicine

## 2018-11-19 ENCOUNTER — Other Ambulatory Visit: Payer: Self-pay | Admitting: Internal Medicine

## 2019-01-09 ENCOUNTER — Encounter: Payer: BLUE CROSS/BLUE SHIELD | Admitting: Internal Medicine

## 2019-01-19 ENCOUNTER — Ambulatory Visit: Payer: BLUE CROSS/BLUE SHIELD | Admitting: Internal Medicine

## 2019-01-19 ENCOUNTER — Encounter: Payer: Self-pay | Admitting: Internal Medicine

## 2019-01-19 ENCOUNTER — Other Ambulatory Visit: Payer: Self-pay

## 2019-01-19 VITALS — BP 138/80 | HR 76 | Resp 12 | Ht 61.0 in | Wt 136.0 lb

## 2019-01-19 DIAGNOSIS — B360 Pityriasis versicolor: Secondary | ICD-10-CM | POA: Diagnosis not present

## 2019-01-19 MED ORDER — ITRACONAZOLE 200 MG PO TABS: ORAL_TABLET | ORAL | 0 refills | Status: DC

## 2019-01-19 MED ORDER — FLUCONAZOLE 150 MG PO TABS: ORAL_TABLET | ORAL | 0 refills | Status: DC

## 2019-01-19 NOTE — Patient Instructions (Signed)
Hold Famotidine while taking the Itraconazole  Also:  May use Nizoral shampoo once daily to lesions and wash off after 5 minutes.

## 2019-01-19 NOTE — Addendum Note (Signed)
Addended by: Marcelino Duster on: 01/19/2019 09:30 PM   Modules accepted: Orders

## 2019-01-19 NOTE — Progress Notes (Addendum)
    Subjective:    Patient ID: Stacy Jackson, female   DOB: 06/06/1981, 38 y.o.   MRN: [id number]   HPI   Started with flaky, itchy lesions on left upper arm about 1 month ago.  She called in and sounded like tinea corporis, so encouraged her to use Terbinafine cream twice daily to lesions for at least 14 days or until lesions resolved.   The lesions on her left upper arm resolved, but she has developed multiple lesions on her thighs, legs, back and finally shares all over her chest. When she gets hot, they itch more. Has not used any other medication on the lesions.  Current Meds  Medication Sig  . amitriptyline (ELAVIL) 10 MG tablet Take 10 mg by mouth at bedtime.  . dicyclomine (BENTYL) 10 MG capsule TAKE 1 CAPSULE BY MOUTH EVERY 12 HOURS AS NEEDED FOR SPASMS OR  ABDOMINAL  PAIN  . DULoxetine (CYMBALTA) 60 MG capsule TAKE 1 CAPSULE BY MOUTH ONCE DAILY  . famotidine (PEPCID) 20 MG tablet TAKE 3 TABLETS BY MOUTH ONCE DAILY ONE HOUR BEFORE BREAKFAST  . fexofenadine (ALLEGRA) 180 MG tablet 1 tab by mouth daily as needed  . gabapentin (NEURONTIN) 400 MG capsule TAKE 1 CAPSULE BY MOUTH THREE TIMES DAILY  . levETIRAcetam (KEPPRA) 1000 MG tablet Take 1,000 mg by mouth 2 (two) times daily.  Marland Kitchen tiZANidine (ZANAFLEX) 4 MG tablet TAKE 1/2 TO 1 (ONE-HALF TO ONE) TABLET BY MOUTH NIGHTLY AS NEEDED MAY  REPEAT  1  TIME  . valACYclovir (VALTREX) 500 MG tablet 1 tab by mouth twice daily   Allergies  Allergen Reactions  . Shrimp [Shellfish Allergy] Anaphylaxis     Review of Systems    Objective:   BP 138/80 (BP Location: Left Arm, Patient Position: Sitting, Cuff Size: Normal)   Pulse 76   Resp 12   Ht 5\' 1"  (1.549 m)   Wt 136 lb (61.7 kg)   LMP 12/14/2012   BMI 25.70 kg/m   Physical Exam  Ovoid and round hyperpigmented lesions with faint flaking of varying sizes on inner thighs, lateral thighs, right lateral malleolus and arms. When takes off her shirt, has many similar lesions  scattered all over trunk, mainly anterior chest, axillary areas, but all over less significantly.   Assessment & Plan   Tinea versicolor:  Itraconazole 200 mg daily for 5 days. May also use Nizoral Shampoo to skin and wash off after 5 minutes 3 days in a row. Her involvement is fairly diffuse, however, so feel oral treatment is best.  Addendum:  Itraconazole not covered. Switch to Fluconazole 300 mg once weekly for 2 weeks

## 2019-01-24 ENCOUNTER — Other Ambulatory Visit: Payer: Self-pay | Admitting: Internal Medicine

## 2019-02-26 ENCOUNTER — Encounter: Payer: Self-pay | Admitting: Internal Medicine

## 2019-02-26 ENCOUNTER — Other Ambulatory Visit: Payer: Self-pay

## 2019-02-26 ENCOUNTER — Ambulatory Visit: Payer: BLUE CROSS/BLUE SHIELD | Admitting: Internal Medicine

## 2019-02-26 VITALS — BP 130/80 | HR 72 | Resp 12 | Ht 61.0 in | Wt 136.0 lb

## 2019-02-26 DIAGNOSIS — L300 Nummular dermatitis: Secondary | ICD-10-CM

## 2019-02-26 DIAGNOSIS — Z114 Encounter for screening for human immunodeficiency virus [HIV]: Secondary | ICD-10-CM

## 2019-02-26 DIAGNOSIS — L853 Xerosis cutis: Secondary | ICD-10-CM | POA: Diagnosis not present

## 2019-02-26 DIAGNOSIS — G8929 Other chronic pain: Secondary | ICD-10-CM | POA: Insufficient documentation

## 2019-02-26 DIAGNOSIS — Z79899 Other long term (current) drug therapy: Secondary | ICD-10-CM | POA: Diagnosis not present

## 2019-02-26 DIAGNOSIS — G40909 Epilepsy, unspecified, not intractable, without status epilepticus: Secondary | ICD-10-CM | POA: Insufficient documentation

## 2019-02-26 DIAGNOSIS — R1031 Right lower quadrant pain: Secondary | ICD-10-CM | POA: Insufficient documentation

## 2019-02-26 DIAGNOSIS — Z0000 Encounter for general adult medical examination without abnormal findings: Secondary | ICD-10-CM | POA: Diagnosis not present

## 2019-02-26 MED ORDER — CLOBETASOL PROPIONATE 0.05 % EX CREA: TOPICAL_CREAM | CUTANEOUS | 0 refills | Status: DC

## 2019-02-26 NOTE — Progress Notes (Signed)
Subjective:    Patient ID: Stacy Jackson, female   DOB: 1981/06/14, 38 y.o.   MRN: [id number]   HPI   CPE without pap  1.  Pap:  History of cervical cancer and underwent vaginal hysterectomy.  Laparoscopic oophorectomy prior to Vag hysterectomy.    2.  Mammogram: Never.  Maternal grandmother diagnosed with breast cancer in her late 39s.  Died at age 79 of breast cancer.    3.  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  Even soy and almond milk cause diarrhea.   Had left ovary out at age 3 yo.    4.  Guaiac Cards:  States has performed once before and negative.   5.  Colonoscopy:  2018 with Dr. Paulita Fujita due to chronic abdominal pain.  6.  Immunizations:  Did not receive 2019, but 2 previous years.   Immunization History  Administered Date(s) Administered  . Influenza Inj Mdck Quad Pf 08/17/2016, 08/24/2017  . Tdap 08/17/2017     7.  Glucose/Cholesterol:  History of diabetes, but underwent Roux en y and lost weight with normalization of sugars.   Cholesterol panel was good in 2017. Lipid Panel     Component Value Date/Time   CHOL 167 06/24/2016 0936   TRIG 33 06/24/2016 0936   HDL 79 06/24/2016 0936   LDLCALC 81 06/24/2016 0936    Current Meds  Medication Sig  . amitriptyline (ELAVIL) 10 MG tablet Take 10 mg by mouth at bedtime.  . dicyclomine (BENTYL) 10 MG capsule TAKE 1 CAPSULE BY MOUTH EVERY 12 HOURS AS NEEDED FOR SPASMS OR  ABDOMINAL  PAIN  . DULoxetine (CYMBALTA) 60 MG capsule TAKE 1 CAPSULE BY MOUTH ONCE DAILY  . famotidine (PEPCID) 20 MG tablet TAKE 3 TABLETS BY MOUTH ONCE DAILY ONE HOUR BEFORE BREAKFAST  . fexofenadine (ALLEGRA) 180 MG tablet 1 tab by mouth daily as needed  . gabapentin (NEURONTIN) 400 MG capsule TAKE 1 CAPSULE BY MOUTH THREE TIMES DAILY  . levETIRAcetam (KEPPRA) 1000 MG tablet Take 1,000 mg by mouth 2 (two) times daily.  Marland Kitchen tiZANidine (ZANAFLEX) 4 MG tablet TAKE 1/2 TO 1 (ONE-HALF TO ONE) TABLET BY MOUTH NIGHTLY  AS NEEDED MAY  REPEAT  1  TIME  . valACYclovir (VALTREX) 500 MG tablet Take 1 tablet by mouth twice daily   Allergies  Allergen Reactions  . Shrimp [Shellfish Allergy] Anaphylaxis     Review of Systems  Constitutional: Negative for appetite change, fatigue (Fair) and unexpected weight change.  HENT: Positive for nosebleeds (every night for last couple of weeks.  Has air conditioner, fan and dehumidifier going in her bedroom.  ). Negative for dental problem, ear pain, hearing loss, rhinorrhea, sneezing and sore throat.   Eyes: Negative for visual disturbance (wears prescription sunglasses.  Legally blind per patient since 1999.  She cannot say why.  Ophthalmologist here in [place].).  Respiratory: Negative for cough and shortness of breath.   Cardiovascular: Positive for palpitations (seconds and gone. Rare. ). Negative for chest pain and leg swelling.  Gastrointestinal: Positive for abdominal pain (RLQ.  Has been felt to be multifactorial.). Negative for blood in stool (No melena.).  Genitourinary: Negative for vaginal discharge.       Urinary retention issues since January.  Started Amitriptyline a month later.  Musculoskeletal: Positive for back pain (Bilateral low back pain with occasional "lightening strikes up middle of back") and myalgias (Right arm pain--intermittent and chronic.). Negative for  arthralgias.  Skin: Positive for rash (Thighs, chest, back.  pruritic at times. See visit from May.  Felt to possibly be tinea at the time and treated with Fluconazole orally and Nizoral shampoo topically.  Did not help.).  Neurological: Positive for numbness (right arm--chronic and unchanged). Negative for weakness.  Psychiatric/Behavioral: Negative for dysphoric mood. The patient is not nervous/anxious.       Objective:   BP 130/80 (BP Location: Left Arm, Patient Position: Sitting, Cuff Size: Normal)   Pulse 72   Resp 12   Ht 5\' 1"  (1.549 m)   Wt 136 lb (61.7 kg)   LMP 12/14/2012    BMI 25.70 kg/m   Physical Exam  Constitutional: She is oriented to person, place, and time. She appears well-developed and well-nourished.  HENT:  Head: Normocephalic and atraumatic.  Right Ear: Hearing, tympanic membrane, external ear and ear canal normal.  Left Ear: Hearing, tympanic membrane, external ear and ear canal normal.  Nose: Nose normal.  Mouth/Throat: Uvula is midline, oropharynx is clear and moist and mucous membranes are normal.  Lot of cerumen in small canals bilaterally.  Unable to see left TM well, but states can hear fine.  Eyes: Pupils are equal, round, and reactive to light. Conjunctivae and EOM are normal.  Neck: Normal range of motion and full passive range of motion without pain. Neck supple. No thyromegaly (perhaps a prominence on the left, but not a definitive nodule.) present.  Cardiovascular: S1 normal and S2 normal. Exam reveals no S3, no S4 and no friction rub.  No murmur heard. Carotic, radial, femoral, DP and PT pulses normal and equal.  Pulmonary/Chest: Effort normal and breath sounds normal. Right breast exhibits no inverted nipple, no mass, no nipple discharge, no skin change and no tenderness. Left breast exhibits no inverted nipple, no mass, no nipple discharge, no skin change and no tenderness.  Abdominal: Soft. Bowel sounds are normal. She exhibits no mass. There is no hepatosplenomegaly. There is no abdominal tenderness. No hernia.  Genitourinary:    Genitourinary Comments: Normal external genitalia. Area of thickening in bladder area midline on bimanual exam.  NT.  Moveable. No adnexal mass or tenderness, particularly on right where has remaining ovary.   Musculoskeletal: Normal range of motion.  Lymphadenopathy:       Head (right side): No submental and no submandibular adenopathy present.       Head (left side): No submental and no submandibular adenopathy present.    She has no cervical adenopathy.    She has no axillary adenopathy.        Right: No inguinal and no supraclavicular adenopathy present.       Left: No inguinal and no supraclavicular adenopathy present.  Neurological: She is alert and oriented to person, place, and time. She has normal strength and normal reflexes. No cranial nerve deficit or sensory deficit. Coordination and gait normal.  Skin: Skin is warm.  Skin is generally quite dry. Diffuse skin striae from previous weight issues.   Different areas of coin sized lesions scattered on abdomen, inner and outer thighs, lateral malleoli with deep skin folds and flaking.  No central clearing or built up perimeters.   All are hyperpigmented Lesion on right lateral malleolus more thickened, almost calloused.    Psychiatric: She has a normal mood and affect. Her speech is normal and behavior is normal. Judgment and thought content normal. Cognition and memory are normal.     Assessment & Plan   1.  CPE  without pap FLP, CBC, CMP, HIV, TSH  2.  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  Consider nutritional deficiencies with Roux en Y and reported limited intake. Clobetasol 0.5% cream twice daily to affected areas as needed. Dove soap. Eucerin cream for eczema relief.

## 2019-02-26 NOTE — Patient Instructions (Addendum)
Can google "advance directives, [place]"  And bring up form from Secretary of [place]. Print and fill out Or can go to "5 wishes"  Which is also in Spanish and fill out--this costs $5--perhaps easier to use. Designate a Forensic scientist to speak for you if you are unable to speak for yourself when ill or injured  Stop Dicyclomine and Amitriptyline for now until follow up in 2 months regarding urinary retention Bring in urine specimen next available.  Bathe with Dove soap and lotion up twice daily with Eucerin Cream for eczema relief

## 2019-02-27 LAB — TSH: TSH: 1.15 u[IU]/mL (ref 0.450–4.500)

## 2019-02-28 ENCOUNTER — Other Ambulatory Visit: Payer: Self-pay | Admitting: Internal Medicine

## 2019-02-28 ENCOUNTER — Other Ambulatory Visit: Payer: Self-pay | Admitting: Pediatric Intensive Care

## 2019-02-28 DIAGNOSIS — Z20822 Contact with and (suspected) exposure to covid-19: Secondary | ICD-10-CM

## 2019-02-28 LAB — CBC WITH DIFFERENTIAL/PLATELET
Basophils Absolute: 0 10*3/uL (ref 0.0–0.2)
Basos: 1 %
EOS (ABSOLUTE): 0.1 10*3/uL (ref 0.0–0.4)
Eos: 1 %
Hematocrit: 33.7 % — ABNORMAL LOW (ref 34.0–46.6)
Hemoglobin: 10.9 g/dL — ABNORMAL LOW (ref 11.1–15.9)
Immature Grans (Abs): 0 10*3/uL (ref 0.0–0.1)
Immature Granulocytes: 0 %
Lymphocytes Absolute: 1.8 10*3/uL (ref 0.7–3.1)
Lymphs: 36 %
MCH: 25.5 pg — ABNORMAL LOW (ref 26.6–33.0)
MCHC: 32.3 g/dL (ref 31.5–35.7)
MCV: 79 fL (ref 79–97)
Monocytes Absolute: 0.4 10*3/uL (ref 0.1–0.9)
Monocytes: 8 %
Neutrophils Absolute: 2.7 10*3/uL (ref 1.4–7.0)
Neutrophils: 54 %
Platelets: 326 10*3/uL (ref 150–450)
RBC: 4.28 x10E6/uL (ref 3.77–5.28)
RDW: 15.1 % (ref 11.7–15.4)
WBC: 5 10*3/uL (ref 3.4–10.8)

## 2019-02-28 LAB — COMPREHENSIVE METABOLIC PANEL
ALT: 16 IU/L (ref 0–32)
AST: 21 IU/L (ref 0–40)
Albumin/Globulin Ratio: 2.2 (ref 1.2–2.2)
Albumin: 4.8 g/dL (ref 3.8–4.8)
Alkaline Phosphatase: 75 IU/L (ref 39–117)
BUN/Creatinine Ratio: 22 (ref 9–23)
BUN: 13 mg/dL (ref 6–20)
Bilirubin Total: 0.3 mg/dL (ref 0.0–1.2)
CO2: 24 mmol/L (ref 20–29)
Calcium: 9.2 mg/dL (ref 8.7–10.2)
Chloride: 101 mmol/L (ref 96–106)
Creatinine, Ser: 0.58 mg/dL (ref 0.57–1.00)
GFR calc Af Amer: 136 mL/min/{1.73_m2} (ref >59)
GFR calc non Af Amer: 118 mL/min/{1.73_m2} (ref >59)
Globulin, Total: 2.2 g/dL (ref 1.5–4.5)
Glucose: 81 mg/dL (ref 65–99)
Potassium: 4.3 mmol/L (ref 3.5–5.2)
Sodium: 139 mmol/L (ref 134–144)
Total Protein: 7 g/dL (ref 6.0–8.5)

## 2019-02-28 LAB — LIPID PANEL W/O CHOL/HDL RATIO
Cholesterol, Total: 231 mg/dL — ABNORMAL HIGH (ref 100–199)
HDL: 117 mg/dL (ref >39)
LDL Calculated: 102 mg/dL — ABNORMAL HIGH (ref 0–99)
Triglycerides: 62 mg/dL (ref 0–149)
VLDL Cholesterol Cal: 12 mg/dL (ref 5–40)

## 2019-02-28 LAB — HIV ANTIBODY (ROUTINE TESTING W REFLEX): HIV Screen 4th Generation wRfx: NONREACTIVE

## 2019-03-01 LAB — NOVEL CORONAVIRUS, NAA: SARS-CoV-2, NAA: NOT DETECTED

## 2019-03-27 ENCOUNTER — Other Ambulatory Visit: Payer: Self-pay | Admitting: Internal Medicine

## 2019-04-02 ENCOUNTER — Other Ambulatory Visit: Payer: Self-pay

## 2019-04-02 ENCOUNTER — Other Ambulatory Visit (INDEPENDENT_AMBULATORY_CARE_PROVIDER_SITE_OTHER): Payer: BLUE CROSS/BLUE SHIELD

## 2019-04-02 DIAGNOSIS — D509 Iron deficiency anemia, unspecified: Secondary | ICD-10-CM | POA: Diagnosis not present

## 2019-04-03 LAB — IRON AND TIBC
Iron Saturation: 25 % (ref 15–55)
Iron: 88 ug/dL (ref 27–159)
Total Iron Binding Capacity: 359 ug/dL (ref 250–450)
UIBC: 271 ug/dL (ref 131–425)

## 2019-04-30 ENCOUNTER — Ambulatory Visit: Payer: BLUE CROSS/BLUE SHIELD | Admitting: Internal Medicine

## 2019-04-30 ENCOUNTER — Other Ambulatory Visit: Payer: Self-pay

## 2019-04-30 ENCOUNTER — Telehealth: Payer: Self-pay | Admitting: Licensed Clinical Social Worker

## 2019-04-30 ENCOUNTER — Encounter: Payer: Self-pay | Admitting: Internal Medicine

## 2019-04-30 VITALS — BP 124/72 | HR 72 | Resp 12 | Ht 61.0 in | Wt 134.0 lb

## 2019-04-30 DIAGNOSIS — R339 Retention of urine, unspecified: Secondary | ICD-10-CM

## 2019-04-30 DIAGNOSIS — R109 Unspecified abdominal pain: Secondary | ICD-10-CM

## 2019-04-30 DIAGNOSIS — D649 Anemia, unspecified: Secondary | ICD-10-CM | POA: Diagnosis not present

## 2019-04-30 LAB — POCT URINALYSIS DIPSTICK
Bilirubin, UA: NEGATIVE
Blood, UA: NEGATIVE
Glucose, UA: NEGATIVE
Ketones, UA: NEGATIVE
Leukocytes, UA: NEGATIVE
Nitrite, UA: NEGATIVE
Protein, UA: NEGATIVE
Spec Grav, UA: <=1.005 — AB (ref 1.010–1.025)
Urobilinogen, UA: 0.2 E.U./dL
pH, UA: 8 (ref 5.0–8.0)

## 2019-04-30 NOTE — Progress Notes (Signed)
Subjective:    Patient ID: Stacy Jackson, female   DOB: 1981/05/26, 38 y.o.   MRN: [id number]   HPI   1.  Interested in finding out if has sickle cell trait.  Just because of her recurrent abdominal pain, skin issues.  She also has chronic anemia.  Intermittently with microcytosis.  She does not tolerate iron--causes abdominal pain.  Normal iron studies last month with anemia.  Has normal RBC count frequently with low MCV and mildly low hemoglobin and HCT.  2.  Urinary complaints:  Complained of urinary urge back in June and inability to void.  Also felt she couldn't feel if she was emptying or not.   States she hears the urine flow in the toilet, but cannot feel that she is emptying.  She cannot tell if she is done voiding.  She states she will sit on the toilet for a while after urinating and urine will start flowing again as if she did not just urinate. No loss or urinary control, however. Stopped her dicyclomine and Amitriptyline after CPE in June.  She cannot say any of her symptoms have changed.   She has NOT restarted the above meds, just filled them. She was unable to urinate last visit and did not return later with a home specimen.    She had mild vaginal bleeding noted mainly with wiping after bimanual exam in June.  Lasted for 3 days.  She is status post hysterectomy.  Has not been on ERT since 2014-2015.  She just stopped ERT as she was on medication she was told the estrogen would interact with-- a migraine medication and so stopped.    3.  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  She is still having abdominal bloating, which has worsened with discontinuation of Dicyclomine.   PT is helping to a certain degree, but they are looking at other treatments as her back pain is worsening with this as well.  Current Meds  Medication Sig  . amitriptyline (ELAVIL) 10 MG tablet Take 10 mg by mouth at bedtime.  . clobetasol cream (TEMOVATE) 0.05  % Apply to affected areas twice daily as needed  . dicyclomine (BENTYL) 10 MG capsule TAKE 1 CAPSULE BY MOUTH EVERY 12 HOURS AS NEEDED FOR SPASMS OR  ABDOMINAL  PAIN  . DULoxetine (CYMBALTA) 60 MG capsule TAKE 1 CAPSULE BY MOUTH ONCE DAILY  . famotidine (PEPCID) 20 MG tablet TAKE 3 TABLETS BY MOUTH ONCE DAILY ONE HOUR BEFORE BREAKFAST  . fexofenadine (ALLEGRA) 180 MG tablet 1 tab by mouth daily as needed  . gabapentin (NEURONTIN) 400 MG capsule TAKE 1 CAPSULE BY MOUTH THREE TIMES DAILY  . levETIRAcetam (KEPPRA) 1000 MG tablet Take 1,000 mg by mouth 2 (two) times daily.  Marland Kitchen tiZANidine (ZANAFLEX) 4 MG tablet TAKE 1/2 TO 1 (ONE-HALF TO ONE) TABLET BY MOUTH NIGHTLY AS NEEDED MAY  REPEAT  1  TIME  . valACYclovir (VALTREX) 500 MG tablet Take 1 tablet by mouth twice daily   Allergies  Allergen Reactions  . Shrimp [Shellfish Allergy] Anaphylaxis     Review of Systems    Objective:   BP 124/72 (BP Location: Left Arm, Patient Position: Sitting, Cuff Size: Normal)   Pulse 72   Resp 12   Ht 5\' 1"  (1.549 m)   Wt 134 lb (60.8 kg)   LMP 12/14/2012   BMI 25.32 kg/m   Physical Exam  NAD Lungs:  CTA CV:  RRR without  murmur or rub.  Abd:  Generalized tenderness of right abdomen--not just over floating ribs or those just above forming costochondral margin.  No rebound or peritoneal signs.  + BS, No HSM or mass. No suprapubic or flank tenderness. Assessment & Plan  1.  Possible urinary retention:  UA.  If normal and continues with symptoms, consider urology referral for urinary bladder emptying evaluation.  2.  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  Feel she also has IBS.  May restart Dicyclomine if she finds this helps.  May also restart Amitriptyline as well as did not help to discontinue.  3.  Intermittent anemia:  Vitamin B12, RBC folate, hemoglobin evaluation to rule out SS trait.  Suspect this would have been identified during previous pregnancy of one of her two  kids.

## 2019-05-03 LAB — VITAMIN B12: Vitamin B-12: 331 pg/mL (ref 232–1245)

## 2019-05-03 LAB — FOLATE RBC
Folate, Hemolysate: 252 ng/mL
Folate, RBC: 783 ng/mL (ref >498)
Hematocrit: 32.2 % — ABNORMAL LOW (ref 34.0–46.6)

## 2019-05-03 LAB — HEMOGLOBINOPATHY EVALUATION
HGB C: 0 %
HGB S: 0 %
HGB VARIANT: 0 %
Hemoglobin A2 Quantitation: 1.9 % (ref 1.8–3.2)
Hemoglobin F Quantitation: 0 % (ref 0.0–2.0)
Hgb A: 98.1 % (ref 96.4–98.8)

## 2019-05-29 NOTE — Telephone Encounter (Signed)
Called patient to schedule counseling appointment.  Left voicemail.

## 2019-07-06 ENCOUNTER — Other Ambulatory Visit: Payer: Self-pay

## 2019-07-06 ENCOUNTER — Encounter: Payer: Self-pay | Admitting: Internal Medicine

## 2019-07-06 ENCOUNTER — Ambulatory Visit: Payer: BLUE CROSS/BLUE SHIELD | Admitting: Internal Medicine

## 2019-07-06 VITALS — BP 102/60 | HR 88 | Temp 98.0°F | Resp 12 | Ht 61.0 in | Wt 129.0 lb

## 2019-07-06 DIAGNOSIS — R21 Rash and other nonspecific skin eruption: Secondary | ICD-10-CM | POA: Diagnosis not present

## 2019-07-06 MED ORDER — CLOBETASOL PROPIONATE 0.05 % EX CREA: TOPICAL_CREAM | CUTANEOUS | 1 refills | Status: DC

## 2019-07-06 NOTE — Progress Notes (Signed)
    Subjective:    Patient ID: Stacy Jackson, female   DOB: Sep 10, 1981, 38 y.o.   MRN: [id number]   HPI   Rash started coming back--see visits from May and June.  Initially just on legs and then low abdomen and below and now on torso and into scalp. Tried treatment for tinea versicolor with Fluconazole, but on follow up in June, lesions were spreading and up higher on torso and with coarse flaking and felt likely to be nummular eczema.  Treated with Clobetasol Cream and did get some improvement with some lesions and not with others--some became large and itchier.   She also uses Ketoconazole shampoo on the lesions without improvement. No pets Kids without skin lesions. No partner.   Current Meds  Medication Sig  . amitriptyline (ELAVIL) 10 MG tablet Take 10 mg by mouth at bedtime.  . dicyclomine (BENTYL) 10 MG capsule TAKE 1 CAPSULE BY MOUTH EVERY 12 HOURS AS NEEDED FOR SPASMS OR  ABDOMINAL  PAIN  . DULoxetine (CYMBALTA) 60 MG capsule TAKE 1 CAPSULE BY MOUTH ONCE DAILY  . famotidine (PEPCID) 20 MG tablet TAKE 3 TABLETS BY MOUTH ONCE DAILY ONE HOUR BEFORE BREAKFAST  . fexofenadine (ALLEGRA) 180 MG tablet 1 tab by mouth daily as needed  . gabapentin (NEURONTIN) 400 MG capsule TAKE 1 CAPSULE BY MOUTH THREE TIMES DAILY  . levETIRAcetam (KEPPRA) 1000 MG tablet Take 1,000 mg by mouth 2 (two) times daily.  Marland Kitchen tiZANidine (ZANAFLEX) 4 MG tablet TAKE 1/2 TO 1 (ONE-HALF TO ONE) TABLET BY MOUTH NIGHTLY AS NEEDED MAY  REPEAT  1  TIME  . valACYclovir (VALTREX) 500 MG tablet Take 1 tablet by mouth twice daily   Allergies  Allergen Reactions  . Shrimp [Shellfish Allergy] Anaphylaxis     Review of Systems    Objective:   BP 102/60 (BP Location: Left Arm, Patient Position: Sitting, Cuff Size: Normal)   Pulse 88   Temp 98 F (36.7 C) (Temporal)   Resp 12   Ht 5\' 1"  (1.549 m)   Wt 129 lb (58.5 kg)   LMP 12/14/2012   BMI 24.37 kg/m   Physical Exam  Scattered lesions of differing sizes on  all extremities, torso, with a large lesion on low left back--some in scalp, but cannot see well.  Most just look like circular lesions with peeling/flaking skin.  Assessment & Plan   Skin lesions:  Check zinc level.  Clobetasol cream twice daily.  Dermatology referral.

## 2019-07-07 LAB — SPECIMEN STATUS

## 2019-08-04 ENCOUNTER — Other Ambulatory Visit: Payer: Self-pay | Admitting: Internal Medicine

## 2019-08-20 ENCOUNTER — Telehealth: Payer: Self-pay | Admitting: Internal Medicine

## 2019-08-20 NOTE — Telephone Encounter (Signed)
Patient called stating was referred to Dermatologist over a month ago and nobody has been contact her yet. Patient stated will like to get any update on what is going on now.   To Cherice to check on referral and contact patient with info.

## 2019-08-21 NOTE — Telephone Encounter (Signed)
Spoke with patient. Appointment scheduled for Dr. Jarome Matin for 10/22/2019 @ 9:30 am. Patient verbalized understanding

## 2019-09-02 DIAGNOSIS — R21 Rash and other nonspecific skin eruption: Secondary | ICD-10-CM | POA: Insufficient documentation

## 2019-09-04 ENCOUNTER — Emergency Department (HOSPITAL_COMMUNITY)
Admission: EM | Admit: 2019-09-04 | Discharge: 2019-09-04 | Disposition: A | Discharge status: Home / Self Care | Payer: BLUE CROSS/BLUE SHIELD | Attending: Emergency Medicine | Admitting: Emergency Medicine

## 2019-09-04 ENCOUNTER — Other Ambulatory Visit: Payer: Self-pay

## 2019-09-04 ENCOUNTER — Encounter: Payer: Self-pay | Admitting: Internal Medicine

## 2019-09-04 ENCOUNTER — Telehealth: Payer: Self-pay | Admitting: Internal Medicine

## 2019-09-04 ENCOUNTER — Encounter (HOSPITAL_COMMUNITY): Payer: Self-pay

## 2019-09-04 DIAGNOSIS — R569 Unspecified convulsions: Secondary | ICD-10-CM | POA: Insufficient documentation

## 2019-09-04 DIAGNOSIS — E119 Type 2 diabetes mellitus without complications: Secondary | ICD-10-CM | POA: Diagnosis not present

## 2019-09-04 DIAGNOSIS — Z79899 Other long term (current) drug therapy: Secondary | ICD-10-CM | POA: Diagnosis not present

## 2019-09-04 DIAGNOSIS — Z8673 Personal history of transient ischemic attack (TIA), and cerebral infarction without residual deficits: Secondary | ICD-10-CM | POA: Diagnosis not present

## 2019-09-04 HISTORY — DX: Sickle-cell disease without crisis: D57.1

## 2019-09-04 HISTORY — DX: Cerebral infarction, unspecified: I63.9

## 2019-09-04 HISTORY — DX: Type 2 diabetes mellitus without complications: E11.9

## 2019-09-04 HISTORY — DX: Unspecified convulsions: R56.9

## 2019-09-04 LAB — CBC WITH DIFFERENTIAL/PLATELET
Abs Immature Granulocytes: 0.02 10*3/uL (ref 0.00–0.07)
Basophils Absolute: 0 10*3/uL (ref 0.0–0.1)
Basophils Relative: 0 %
Eosinophils Absolute: 0 10*3/uL (ref 0.0–0.5)
Eosinophils Relative: 0 %
HCT: 34.6 % — ABNORMAL LOW (ref 36.0–46.0)
Hemoglobin: 10.7 g/dL — ABNORMAL LOW (ref 12.0–15.0)
Immature Granulocytes: 0 %
Lymphocytes Relative: 19 %
Lymphs Abs: 1 10*3/uL (ref 0.7–4.0)
MCH: 24.3 pg — ABNORMAL LOW (ref 26.0–34.0)
MCHC: 30.9 g/dL (ref 30.0–36.0)
MCV: 78.6 fL — ABNORMAL LOW (ref 80.0–100.0)
Monocytes Absolute: 0.6 10*3/uL (ref 0.1–1.0)
Monocytes Relative: 11 %
Neutro Abs: 3.6 10*3/uL (ref 1.7–7.7)
Neutrophils Relative %: 70 %
Platelets: 310 10*3/uL (ref 150–400)
RBC: 4.4 MIL/uL (ref 3.87–5.11)
RDW: 16.9 % — ABNORMAL HIGH (ref 11.5–15.5)
WBC: 5.2 10*3/uL (ref 4.0–10.5)
nRBC: 0 % (ref 0.0–0.2)

## 2019-09-04 LAB — COMPREHENSIVE METABOLIC PANEL
ALT: 13 U/L (ref 0–44)
AST: 19 U/L (ref 15–41)
Albumin: 4.2 g/dL (ref 3.5–5.0)
Alkaline Phosphatase: 60 U/L (ref 38–126)
Anion gap: 13 (ref 5–15)
BUN: 14 mg/dL (ref 6–20)
CO2: 23 mmol/L (ref 22–32)
Calcium: 8.8 mg/dL — ABNORMAL LOW (ref 8.9–10.3)
Chloride: 102 mmol/L (ref 98–111)
Creatinine, Ser: 0.6 mg/dL (ref 0.44–1.00)
GFR calc Af Amer: >60 mL/min (ref >60)
GFR calc non Af Amer: >60 mL/min (ref >60)
Glucose, Bld: 82 mg/dL (ref 70–99)
Potassium: 3.9 mmol/L (ref 3.5–5.1)
Sodium: 138 mmol/L (ref 135–145)
Total Bilirubin: 1.2 mg/dL (ref 0.3–1.2)
Total Protein: 7.1 g/dL (ref 6.5–8.1)

## 2019-09-04 LAB — I-STAT BETA HCG BLOOD, ED (MC, WL, AP ONLY): I-stat hCG, quantitative: <5 m[IU]/mL (ref <5)

## 2019-09-04 LAB — ETHANOL: Alcohol, Ethyl (B): <10 mg/dL (ref <10)

## 2019-09-04 MED ORDER — KETOROLAC TROMETHAMINE 30 MG/ML IJ SOLN
15.0000 mg | Freq: Once | INTRAMUSCULAR | Status: AC
  Administered 2019-09-04: 15 mg via INTRAVENOUS
  Filled 2019-09-04: qty 1

## 2019-09-04 MED ORDER — SODIUM CHLORIDE 0.9 % IV BOLUS
500.0000 mL | Freq: Once | INTRAVENOUS | Status: AC
  Administered 2019-09-04: 500 mL via INTRAVENOUS

## 2019-09-04 MED ORDER — ACETAMINOPHEN 500 MG PO TABS
1000.0000 mg | ORAL_TABLET | Freq: Once | ORAL | Status: AC
  Administered 2019-09-04: 13:00:00 1000 mg via ORAL
  Filled 2019-09-04: qty 2

## 2019-09-04 NOTE — ED Triage Notes (Signed)
Pt BIB EMS from home. Pt has hx of epilepsy and reports taking medication today. Pt reports a seizure last night and that her post-ictal state has lasted longer than it normally does. EMS describes it as aphasia which the pt reports is normal after seizure. No other neuro deficits noted.   18G LAC 150/110 HR 120 HR 20 Temp 98.1 98% RA CBG 92

## 2019-09-04 NOTE — ED Provider Notes (Signed)
[facility] DEPT Provider Note   CSN: [id number] Arrival date & time: 09/04/19  1105     History Chief Complaint  Patient presents with  . Seizures    Stacy Jackson is a 38 y.o. female.  HPI    Patient presents after a witnessed seizure. The patient herself is awake, slightly slowed speech, consistent with postictal phase, but interactive. She states that she was in her usual state of health, taking all medication regularly, when she had seizure, more severe than usual.  She currently complains of headache, denies focal weakness, denies confusion, denies nausea. She denies recent change in medication, diet.   EMS reports that the patient's seizure was reportedly more severe than her usual episodes, longer lasting They deny hemodynamic instability in route.   Past Medical History:  Diagnosis Date  . Diabetes mellitus without complication ([place])   . Seizures ([place])   . Sickle cell anemia (H[facility])   . Stroke [facility])     There are no problems to display for this patient.   History reviewed. No pertinent surgical history.   OB History   No obstetric history on file.     History reviewed. No pertinent family history.  Social History   Tobacco Use  . Smoking status: Not on file  Substance Use Topics  . Alcohol use: Not on file  . Drug use: Not on file    Home Medications Prior to Admission medications   Medication Sig Start Date End Date Taking? Authorizing Provider  amitriptyline (ELAVIL) 10 MG tablet Take 30 mg by mouth daily. 07/01/19  Yes [provider]  clobetasol cream (TEMOVATE) [id number] % Apply 1 application topically daily as needed for rash. 07/07/19  Yes [provider]  dicyclomine (BENTYL) 10 MG capsule Take 10 mg by mouth 3 (three) times daily as needed for cramping. 05/25/19  Yes [provider]  DULoxetine (CYMBALTA) 60 MG capsule Take 60 mg by mouth daily. 04/08/19  Yes [provider]    gabapentin (NEURONTIN) 400 MG capsule Take 400 mg by mouth 2 (two) times daily.   Yes [provider]  levETIRAcetam (KEPPRA) 500 MG tablet Take 1,000 mg by mouth 2 (two) times daily. 08/04/19  Yes [provider]  naproxen sodium (ALEVE) 220 MG tablet Take 220 mg by mouth daily as needed (pain).   Yes [provider]  tiZANidine (ZANAFLEX) 4 MG tablet Take 4 mg by mouth 3 (three) times daily. 08/05/19  Yes [provider]  valACYclovir (VALTREX) 500 MG tablet Take 1,000 mg by mouth daily. 08/04/19  Yes [provider]    Allergies    Shellfish allergy  Review of Systems   Review of Systems  Constitutional:       Per HPI, otherwise negative  HENT:       Per HPI, otherwise negative  Respiratory:       Per HPI, otherwise negative  Cardiovascular:       Per HPI, otherwise negative  Gastrointestinal: Negative for vomiting.  Endocrine:       Negative aside from HPI  Genitourinary:       Neg aside from HPI   Musculoskeletal:       Per HPI, otherwise negative  Skin: Negative.   Neurological: Positive for seizures. Negative for syncope.    Physical Exam Updated Vital Signs BP (!) 143/103   Pulse (!) 108   Temp 98.5 F (36.9 C)   Resp 19   SpO2 100%  Physical Exam Vitals and nursing note reviewed.  Constitutional:      General: She is not in acute distress.    Appearance: She is well-developed.  HENT:     Head: Normocephalic and atraumatic.  Eyes:     Conjunctiva/sclera: Conjunctivae normal.  Cardiovascular:     Rate and Rhythm: Normal rate and regular rhythm.  Pulmonary:     Effort: Pulmonary effort is normal. No respiratory distress.     Breath sounds: Normal breath sounds. No stridor.  Abdominal:     General: There is no distension.  Skin:    General: Skin is warm and dry.  Neurological:     Mental Status: She is alert and oriented to person, place, and time.     Cranial Nerves: No cranial nerve deficit.     Motor:  No weakness.     Comments: Slow, clear, halting speech, otherwise neurologic exam is unremarkable.     ED Results / Procedures / Treatments   Labs (all labs ordered are listed, but only abnormal results are displayed) Labs Reviewed  COMPREHENSIVE METABOLIC PANEL - Abnormal; Notable for the following components:      Result Value   Calcium 8.8 (*)    All other components within normal limits  CBC WITH DIFFERENTIAL/PLATELET - Abnormal; Notable for the following components:   Hemoglobin 10.7 (*)    HCT 34.6 (*)    MCV 78.6 (*)    MCH 24.3 (*)    RDW 16.9 (*)    All other components within normal limits  ETHANOL  I-STAT BETA HCG BLOOD, ED ([facility], [facility], AP ONLY)    Procedures Procedures (including critical care time)  Medications Ordered in ED Medications  acetaminophen (TYLENOL) tablet 1,000 mg (has no administration in time range)  sodium chloride 0.9 % bolus 500 mL (500 mLs Intravenous New Bag/Given (Non-Interop) 09/04/19 1144)  ketorolac (TORADOL) 30 MG/ML injection 15 mg (15 mg Intravenous Given 09/04/19 1144)    ED Course  I have reviewed the triage vital signs and the nursing notes.  Pertinent labs & imaging results that were available during my care of the patient were reviewed by me and considered in my medical decision making (see chart for details).    MDM Rules/Calculators/A&P                      1:08 PM Exam patient is awake alert in no distress.  Vital signs unremarkable, labs unremarkable, reviewed with her. Patient does have some tenderness palpation about the sternum, but no other complaints of pain. With reproducible discomfort, patient will receive Tylenol, has received Toradol, no evidence for ACS, with no other chest pain, no dyspnea.  This adult female with history of seizures presents after witnessed seizure.  She is awake, alert, afebrile, speaking clearly after she recovers from postictal phase, no evidence for other acute new pathology. Patient  received fluids, Toradol, Tylenol for likely musculoskeletal discomfort. Patient appropriate for discharge with close outpatient follow-up. Final Clinical Impression(s) / ED Diagnoses Final diagnoses:  Seizure [facility])     Carmin Muskrat, MD 09/04/19 1309

## 2019-09-04 NOTE — Discharge Instructions (Signed)
As discussed, your evaluation today has been largely reassuring.  But, it is important that you monitor your condition carefully, and do not hesitate to return to the ED if you develop new, or concerning changes in your condition. ? ?Otherwise, please follow-up with your physician for appropriate ongoing care. ? ?

## 2019-09-04 NOTE — Telephone Encounter (Signed)
Called patient back and she reiterated that she the latter.. patient advise to go to the hospital now. Per Dr. Amil Amen Pt. States she will call her sister so she can take her to hospital. Pt. Inform us when admitted.

## 2019-09-04 NOTE — Telephone Encounter (Signed)
Patient called stating had a stroke and seizure last night around  8:00 pm. patient stated this morning  feels everything is foggy and still feeling out of balance with hot flashes, also pt. Stated is having difficulties to speak at and is calling to get suggestion from Dr. Amil Amen on what to do.   Patient's [phone]    Please advise

## 2019-10-31 ENCOUNTER — Encounter: Payer: Self-pay | Admitting: Internal Medicine

## 2019-10-31 ENCOUNTER — Other Ambulatory Visit: Payer: Self-pay

## 2019-10-31 ENCOUNTER — Ambulatory Visit: Payer: BLUE CROSS/BLUE SHIELD | Admitting: Internal Medicine

## 2019-10-31 VITALS — BP 122/80 | HR 78 | Resp 12 | Ht 61.0 in | Wt 129.0 lb

## 2019-10-31 DIAGNOSIS — R21 Rash and other nonspecific skin eruption: Secondary | ICD-10-CM | POA: Diagnosis not present

## 2019-10-31 DIAGNOSIS — R002 Palpitations: Secondary | ICD-10-CM

## 2019-10-31 DIAGNOSIS — M940 Chondrocostal junction syndrome [Tietze]: Secondary | ICD-10-CM

## 2019-10-31 DIAGNOSIS — R29810 Facial weakness: Secondary | ICD-10-CM

## 2019-10-31 DIAGNOSIS — R4781 Slurred speech: Secondary | ICD-10-CM | POA: Diagnosis not present

## 2019-10-31 NOTE — Progress Notes (Signed)
Subjective:    Patient ID: Stacy Jackson, female   DOB: 03-09-1981, 39 y.o.   MRN: [id number]   HPI   1.  Rash:  Went to [employer] about a week ago and found out her insurance did not cover her there.  She needs to go to [facility] instead.   Rash is getting worse.  2.  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  She is undergoing trigger injection both to back and abdominal musculature.    3.  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 States prior to the episode, she felt her words start slurring, eventually not being able to form her words. She felt discomfort down her left arm and chest pain.  She felt very hot and had dyspnea.  Her sister, who was present told her her face looked like it was drooping. She had not taken her Keppra yet that morning--so went and took her dose.  She actually walked to where the medication was and swallowed with water without difficulty.  No drooling. After about 1 hour, the neurologic symptoms resolved save for some slurring, but the chest pain and shortness of breath remained somewhat.   Her sister wanted her to go to the ED, but she declined at that point.  About 20 minutes after her sister left, her son observed her staring off and developed tremors all over.  She states she was conscious the whole time. She states she is still following with her neurologist in [place].  She has not seen him since this episode and is driving. She initially stated EMS had to shock her heart prior to ED, but later able to clarify she just had cardiac monitor leads placed enroute.   Dr. Raliegh Ip in [place], [place]  Her last visit was in 10/2018.   She thinks she has her yearly appt coming up. His phone is:  [phone] Her ED evaluation really just focused on this as a prolonged seizure rather than what she feels was something  that provoked the seizure like activity.   Still a very strong question as to whether these are pseudoseizures.    4.  Chest pain :  Constant.  Feels like someone punched her in sternum--deep soreness Palpitations at any time.  Feels like heart just takes off running.  No associated light headedness.  + associated dyspnea.  Feels hot and sweaty.    No increased stress.   No significant caffeine, energy drinks or supplements. No significant weight change.  No diarrhea.  No change in appetite. Current Meds  Medication Sig  . amitriptyline (ELAVIL) 10 MG tablet Take 30 mg by mouth daily.  . clobetasol cream (TEMOVATE) 0.05 % Apply to affected areas twice daily as needed  . cyanocobalamin 100 MCG tablet Take 1,000 mcg by mouth daily.   Marland Kitchen dicyclomine (BENTYL) 10 MG capsule TAKE 1 CAPSULE BY MOUTH EVERY 12 HOURS AS NEEDED FOR SPASMS OR  ABDOMINAL  PAIN  . DULoxetine (CYMBALTA) 60 MG capsule TAKE 1 CAPSULE BY MOUTH ONCE DAILY  . famotidine (PEPCID) 20 MG tablet TAKE 3 TABLETS BY MOUTH ONCE DAILY ONE HOUR BEFORE BREAKFAST  . fexofenadine (ALLEGRA) 180 MG tablet 1 tab by mouth daily as needed  . gabapentin (NEURONTIN) 400 MG capsule TAKE 1 CAPSULE BY MOUTH THREE TIMES DAILY  . levETIRAcetam (KEPPRA) 1000 MG tablet Take 1,000 mg  by mouth 2 (two) times daily.  . naproxen sodium (ALEVE) 220 MG tablet Take 220 mg by mouth daily as needed (pain).  Marland Kitchen tiZANidine (ZANAFLEX) 4 MG tablet TAKE 1/2 TO 1 (ONE-HALF TO ONE) TABLET BY MOUTH NIGHTLY AS NEEDED MAY  REPEAT  1  TIME  . valACYclovir (VALTREX) 500 MG tablet Take 1,000 mg by mouth daily.   Allergies  Allergen Reactions  . Shrimp [Shellfish Allergy] Anaphylaxis     Review of Systems    Objective:   BP 122/80 (BP Location: Left Arm, Patient Position: Sitting, Cuff Size: Normal)   Pulse 78   Resp 12   Ht 5\' 1"  (1.549 m)   Wt 129 lb (58.5 kg)   LMP 12/14/2012   BMI 24.37 kg/m   Physical Exam  NAD HEENT:  PERRL EOMI, TMs pearly gray. Neck:   Supple, No adenopathy, no thyromegaly Chest:  CTA, mild tenderness at costal margin with sternum? CV:  RRR with normal S1 and S2, No S3, S4 or murmur.  No carotid bruits.  Carotid, radial, DP and PT pulses normal and equal No LE edema Abd:  S, NT, No HSM or mass, + BS Skin:  Ovoid and circular with lightly flaking lesions.  Almost a collar of flaking outlining the periphery.  Salmon colored base.  Very small to several centimeters in length over torso, upper arms and legs Neuro:  A & O x 3, CN II-XII grossly intact, DTRs 2+/4 and Motor 5/5 throughout.  Gait normal.  Negative [place]  ECG:  Normal today Assessment & Plan  1.  Skin lesions:  Has not responded to treatment as fungal etiology.  Not clear if possible drug eruption or other.  Referral to [place] where her insurance will cover.  2.  Slurred speech, left facial weakness/Seizures vs Pseudoseizures vs possibility of TIA:  Here locally was felt to be more consistent with pseudoseizures when saw Neuro.   Will send for records from "Dr. Raliegh Ip" in [place] and contact their office as well to get a better idea of assessment and plan from their perspective. MR/MRA, labs Encouraged her to make sure she has follow up with her neurologist in [place]  3.  Atypical Chest Pain and palpitations:  CBC, CMP, TSH.  ECG normal. Suspect chest wall etiology/costochondritis

## 2019-11-01 LAB — TSH: TSH: 0.585 u[IU]/mL (ref 0.450–4.500)

## 2019-11-01 LAB — CBC WITH DIFFERENTIAL/PLATELET
Basophils Absolute: 0 x10E3/uL (ref 0.0–0.2)
Basos: 1 %
EOS (ABSOLUTE): 0 x10E3/uL (ref 0.0–0.4)
Eos: 1 %
Hematocrit: 31.8 % — ABNORMAL LOW (ref 34.0–46.6)
Hemoglobin: 9.9 g/dL — ABNORMAL LOW (ref 11.1–15.9)
Immature Grans (Abs): 0 x10E3/uL (ref 0.0–0.1)
Immature Granulocytes: 0 %
Lymphocytes Absolute: 1.5 x10E3/uL (ref 0.7–3.1)
Lymphs: 31 %
MCH: 23.7 pg — ABNORMAL LOW (ref 26.6–33.0)
MCHC: 31.1 g/dL — ABNORMAL LOW (ref 31.5–35.7)
MCV: 76 fL — ABNORMAL LOW (ref 79–97)
Monocytes Absolute: 0.4 x10E3/uL (ref 0.1–0.9)
Monocytes: 7 %
Neutrophils Absolute: 3 x10E3/uL (ref 1.4–7.0)
Neutrophils: 60 %
Platelets: 409 x10E3/uL (ref 150–450)
RBC: 4.17 x10E6/uL (ref 3.77–5.28)
RDW: 15.3 % (ref 11.7–15.4)
WBC: 4.9 x10E3/uL (ref 3.4–10.8)

## 2019-12-05 DIAGNOSIS — R29810 Facial weakness: Secondary | ICD-10-CM | POA: Insufficient documentation

## 2019-12-05 DIAGNOSIS — M940 Chondrocostal junction syndrome [Tietze]: Secondary | ICD-10-CM | POA: Insufficient documentation

## 2019-12-05 DIAGNOSIS — R002 Palpitations: Secondary | ICD-10-CM | POA: Insufficient documentation

## 2019-12-05 DIAGNOSIS — R4781 Slurred speech: Secondary | ICD-10-CM | POA: Insufficient documentation

## 2019-12-19 ENCOUNTER — Other Ambulatory Visit (INDEPENDENT_AMBULATORY_CARE_PROVIDER_SITE_OTHER): Payer: BLUE CROSS/BLUE SHIELD

## 2019-12-19 ENCOUNTER — Other Ambulatory Visit: Payer: Self-pay

## 2019-12-19 DIAGNOSIS — D649 Anemia, unspecified: Secondary | ICD-10-CM

## 2019-12-20 LAB — IRON AND TIBC
Iron Saturation: 10 % — ABNORMAL LOW (ref 15–55)
Iron: 39 ug/dL (ref 27–159)
Total Iron Binding Capacity: 381 ug/dL (ref 250–450)
UIBC: 342 ug/dL (ref 131–425)

## 2019-12-31 MED ORDER — FERROUS GLUCONATE 324 (38 FE) MG PO TABS: ORAL_TABLET | ORAL | 3 refills | Status: DC

## 2019-12-31 NOTE — Addendum Note (Signed)
Addended by: Marcelino Duster on: 12/31/2019 05:24 AM   Modules accepted: Orders

## 2020-01-28 ENCOUNTER — Telehealth: Payer: Self-pay | Admitting: Internal Medicine

## 2020-01-28 ENCOUNTER — Encounter: Payer: Self-pay | Admitting: Internal Medicine

## 2020-01-28 ENCOUNTER — Other Ambulatory Visit: Payer: Self-pay

## 2020-01-28 ENCOUNTER — Ambulatory Visit: Payer: BLUE CROSS/BLUE SHIELD | Admitting: Internal Medicine

## 2020-01-28 VITALS — BP 156/94 | HR 114 | Temp 98.7°F | Ht 60.0 in | Wt 128.0 lb

## 2020-01-28 DIAGNOSIS — D509 Iron deficiency anemia, unspecified: Secondary | ICD-10-CM

## 2020-01-28 DIAGNOSIS — R4781 Slurred speech: Secondary | ICD-10-CM

## 2020-01-28 DIAGNOSIS — I10 Essential (primary) hypertension: Secondary | ICD-10-CM

## 2020-01-28 DIAGNOSIS — L409 Psoriasis, unspecified: Secondary | ICD-10-CM

## 2020-01-28 DIAGNOSIS — R29810 Facial weakness: Secondary | ICD-10-CM

## 2020-01-28 DIAGNOSIS — M940 Chondrocostal junction syndrome [Tietze]: Secondary | ICD-10-CM

## 2020-01-28 DIAGNOSIS — R000 Tachycardia, unspecified: Secondary | ICD-10-CM

## 2020-01-28 MED ORDER — CLOBETASOL PROPIONATE 0.05 % EX OINT: TOPICAL_OINTMENT | CUTANEOUS | 2 refills | Status: DC

## 2020-01-28 MED ORDER — METOPROLOL SUCCINATE ER 25 MG PO TB24: 50.0000 mg | ORAL_TABLET | Freq: Every day | ORAL | 11 refills | Status: DC

## 2020-01-28 NOTE — Progress Notes (Signed)
Subjective:    Patient ID: Stacy Jackson, female   DOB: 1981/04/06, 39 y.o.   MRN: [id number]   HPI   1.  Psoriasis:  Not clear if she is felt to have inflammatory joint disease with this as well.  She is now using MTX 2.5 mg 3 tabs on Sunday and 3 tabs on Monday.  Folic Acid 1 mg daily. Lesions are not as pruritic.  Old lesions healing up, but she does still get new lesions--not as frequently, however.  Scalp lesions are still the most symptomatic.  Sounds like ultimately a plan for biologics in the future per patient, but unable to find that info in what I can see in Care Everywhere note.   Still on Clobetasol twice daily.  Burns when she uses on lesions.  Does have a mild anemia, which is chronic and has an element of iron deficiency.  2.  Slurring of words, facial drooping, perhaps the left side and numbness on the left face as well.  See note from 10/31/19.  Have been having difficulty getting hold of her neurologist's office in [place].  Did receive records from there back in Feb/March, but not clear definitive diagnosis of seizures made there.  She was unable to get the repeat MR/MRA of brain as her insurance does not cover at [place], something of which I was not aware--she cancelled on own.   She has not had further symptoms since last seen in Feb.    3.  Chest pain:  No pressure pain as before, just feels her heart beating fast at times.  Generally a couple times daily.  Usually lasts 10-15 minutes.  Can occur in sleep.  Her BP was running high in Dermatology office as well. TSH was low normal last check in February.  She is having tremulousness.  No increased appetite, diarrhea.  Perhaps with some cold intolerance.  When she feels the tachypalpitations, she feels hot, however. No definite light headedness.   4.  Pain in mouth --roof and sides in particular when takes a bite of food. Seems to orginate from roof of mouth.  Every time she puts food in her mouth.   Feels like a hard piece of candy and it is being pressed up hard into the roof of her mouth.  No specific type of food or tasting food sets this off.    Current Meds  Medication Sig  . amitriptyline (ELAVIL) 10 MG tablet Take 30 mg by mouth daily.  . clobetasol cream (TEMOVATE) 0.05 % Apply to affected areas twice daily as needed  . cyanocobalamin 100 MCG tablet Take 1,000 mcg by mouth daily.   . famotidine (PEPCID) 20 MG tablet TAKE 3 TABLETS BY MOUTH ONCE DAILY ONE HOUR BEFORE BREAKFAST  . ferrous gluconate (FERGON) 324 MG tablet 1 tab by mouth with Vitamin C 500 mg twice daily  . folic acid (FOLVITE) 1 MG tablet Take by mouth.  . gabapentin (NEURONTIN) 400 MG capsule TAKE 1 CAPSULE BY MOUTH THREE TIMES DAILY  . levETIRAcetam (KEPPRA) 1000 MG tablet Take 1,000 mg by mouth 2 (two) times daily.  . methotrexate (RHEUMATREX) 2.5 MG tablet Take 15 mg (6 tabs) weekly. Can take 3 tabs on Sunday and 3 tabs on Monday.  Marland Kitchen tiZANidine (ZANAFLEX) 4 MG tablet TAKE 1/2 TO 1 (ONE-HALF TO ONE) TABLET BY MOUTH NIGHTLY AS NEEDED MAY  REPEAT  1  TIME   Allergies  Allergen Reactions  . Shrimp [Shellfish Allergy] Anaphylaxis  Review of Systems    Objective:   BP (!) 156/94   Pulse (!) 114   Temp 98.7 F (37.1 C)   Ht 5' (1.524 m)   Wt 128 lb (58.1 kg)   LMP 12/14/2012   BMI 25.00 kg/m   Physical Exam  NAD HEENT:  No lid lag or proptosis.  EOMI, PERRL.  No oral lesions on roof of mouth  Neck:  Possibly mild thyromegaly.  No bruit, no adenopathy Chest:  CTA.  Tender mildly over left sternal margin. CV:  Mildly tachy, but regular.  No murmur or rub.  Radial and DP pulses normal and equal.   LE:  No edema Neuro:  A & O x 3, CN II-XII grossly intact.  DTRs 2+/4, motor 5/5 throughout.  Sensory grossly normal. SkIn:  Lesions much more faded and smaller.   Assessment & Plan  1.  Psoriasis:  MTX as per Dermatology.  2.  Constellation of neurologic signs and symptoms.  Question of seizure  disorder vs pseudoseizures as well as history of complicated migraines.   Follow up with neuro before ordering MR/MRA. Addendum:  After reviewing notes from her neurologist in [place], [place], was ultimately able to reach Neurologist's assistant and discuss concerns with patient's symptoms patient is sharing here and inability to find definitive evidence from her records at two hospitals in [place] of a seizure disorder or other neurologic disorder to explain symptoms.  She stated their office would be giving Ms. Vandersteen a call for a follow up appt as soon as possible.   3.  Palpitations with inadequately controlled hypertension:  ECG previously normal.  TSH and free T4.  Start Metoprolol XL 50 mg daily.  BP and pulses check with nurse in 2 weeks.  4.  Mild iron deficiency anemia:  Iron supplementation.  5.  Mild costochondritis.

## 2020-01-28 NOTE — Telephone Encounter (Signed)
Called and left message would like to discuss Stacy Jackson's case:  Apparently, last provider who saw her was [facility] [place]. Left a message to call my personal cell or the clinic front office number to discuss her case. Her MR/MRA for some reason was never obtained.  Do not see documentation as to why.

## 2020-01-29 LAB — TSH: TSH: 0.964 u[IU]/mL (ref 0.450–4.500)

## 2020-01-29 LAB — T4, FREE: Free T4: 0.95 ng/dL (ref 0.82–1.77)

## 2020-02-12 ENCOUNTER — Ambulatory Visit (INDEPENDENT_AMBULATORY_CARE_PROVIDER_SITE_OTHER): Payer: BLUE CROSS/BLUE SHIELD

## 2020-02-12 ENCOUNTER — Other Ambulatory Visit: Payer: Self-pay

## 2020-02-12 VITALS — BP 122/88 | HR 88

## 2020-02-12 DIAGNOSIS — I10 Essential (primary) hypertension: Secondary | ICD-10-CM

## 2020-02-12 NOTE — Progress Notes (Signed)
patient came in for BP and pulse check. Patient BP in normal range now. Informed to continue current dose of medication. Patient verbalized understanding.

## 2020-04-10 ENCOUNTER — Other Ambulatory Visit: Payer: Self-pay | Admitting: Internal Medicine

## 2020-04-18 ENCOUNTER — Other Ambulatory Visit: Payer: Self-pay

## 2020-04-18 MED ORDER — FAMOTIDINE 20 MG PO TABS: ORAL_TABLET | ORAL | 11 refills | Status: DC

## 2020-04-18 MED ORDER — GABAPENTIN 400 MG PO CAPS: ORAL_CAPSULE | ORAL | 11 refills | Status: AC

## 2020-04-20 DIAGNOSIS — I10 Essential (primary) hypertension: Secondary | ICD-10-CM | POA: Insufficient documentation

## 2020-04-20 DIAGNOSIS — R000 Tachycardia, unspecified: Secondary | ICD-10-CM | POA: Insufficient documentation

## 2020-04-20 DIAGNOSIS — L409 Psoriasis, unspecified: Secondary | ICD-10-CM | POA: Insufficient documentation

## 2020-04-29 ENCOUNTER — Encounter: Payer: Self-pay | Admitting: Internal Medicine

## 2020-04-29 ENCOUNTER — Ambulatory Visit (INDEPENDENT_AMBULATORY_CARE_PROVIDER_SITE_OTHER): Payer: Medicaid Other | Admitting: Internal Medicine

## 2020-04-29 VITALS — BP 110/82 | HR 82 | Resp 12 | Ht 60.0 in | Wt 134.0 lb

## 2020-04-29 DIAGNOSIS — L409 Psoriasis, unspecified: Secondary | ICD-10-CM | POA: Diagnosis not present

## 2020-04-29 DIAGNOSIS — M940 Chondrocostal junction syndrome [Tietze]: Secondary | ICD-10-CM

## 2020-04-29 DIAGNOSIS — D509 Iron deficiency anemia, unspecified: Secondary | ICD-10-CM

## 2020-04-29 DIAGNOSIS — R569 Unspecified convulsions: Secondary | ICD-10-CM | POA: Diagnosis not present

## 2020-04-29 DIAGNOSIS — R4781 Slurred speech: Secondary | ICD-10-CM

## 2020-04-29 MED ORDER — DICYCLOMINE HCL 10 MG PO CAPS: ORAL_CAPSULE | ORAL | 2 refills | Status: AC

## 2020-04-29 NOTE — Progress Notes (Signed)
Subjective:    Patient ID: Stacy Jackson, female   DOB: June 10, 1981, 39 y.o.   MRN: [id number]   HPI   1.  Central chest pain radiating to right shoulder.  Feels this is since she had COvID.  States was diagnosed sometime last year--about this time and through [facility], but unable to find in chart.  States she was ill for about 3 weeks.  She does at times have a cough and when tries to run--lots of discomfort.  Feels she also just can't get her breath.  2.  Psoriasis/psoriatic arthritis.:  Skin lesions have cleared up well.  Felt to have psoriatic arthritis.  Moving to an injected medication (Cosentyx) and decreasing the MTX to prevent joint progression disease.  To start next week.   3.  Neurologic signs and symptoms:  Patient first brought concerns about neurologic symptoms/possible seizures to our attention at her visit April 11, 2017.  Question of seizure vs pseudoseizure activity from two separate hospitals/EDs when visiting in [place].  Ultimately, underwent evaluation here in [place] from fall of 2018 through Feb of 2019 with Neurology, Dr. Leta Baptist.  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  He recommended she wean off anti seizure meds, but apparently went back to Neurologist in [place] and was started/maintained on anti-seizure meds.  After review of records from Neurology in [place], not clear they were aware of work up here and could not find new evaluation with that office--just review of records from the two [place].  See Assessment and plan from note May 10th  2019.  Able to speak with provider/nursing at the [facility] Neurology office about her previous evaluations, her new symptoms in February of 2021 of facial droop and numbness, and slurred speech, reportedly observed by her son, but no medical  medical attention sought  by patient.  The [place] office was planning to call her in for a visit.   She, however, has not been seen there as her insurance has changed.  She does not want to return to Dr. Leta Baptist.  Would prefer [facility] Has not had any further symptoms of speech slurring and facial drooping.    4.  Sliding/Slipped Rib Syndrome:  She states her discomfort is slowly returning.  Trigger point injections have really helped.    5.  Abdominal complaints:  Responds to Dicyclomine.  Would like a refill.    6.  Iron deficiency anemia with normal colonsocopy in 2018 other than internal hemorrhoids.  Never returned guaiac cards and no melena or hematochezia.  Current Meds  Medication Sig  . amitriptyline (ELAVIL) 10 MG tablet Take 30 mg by mouth daily.  . clobetasol ointment (TEMOVATE) 0.05 % Apply to affected areas twice daily as needed  . cyanocobalamin 100 MCG tablet Take 1,000 mcg by mouth daily.   . DULoxetine (CYMBALTA) 60 MG capsule TAKE 1 CAPSULE BY MOUTH ONCE DAILY  . famotidine (PEPCID) 20 MG tablet 3 tablets by mouth 1 hour before breakfast  . ferrous gluconate (FERGON) 324 MG tablet 1 tab by mouth with Vitamin C 500 mg twice daily  . folic acid (FOLVITE) 1 MG tablet Take by mouth.  . gabapentin (NEURONTIN) 400 MG capsule TAKE 1 CAPSULE BY MOUTH THREE TIMES DAILY  . levETIRAcetam (KEPPRA) 1000 MG tablet Take 1,000 mg by mouth 2 (two) times daily.  . methotrexate ([place])  2.5 MG tablet Take 15 mg (6 tabs) weekly. Can take 3 tabs on Sunday and 3 tabs on Monday.  . metoprolol succinate (TOPROL-XL) 25 MG 24 hr tablet Take 2 tablets (50 mg total) by mouth daily. Take with or immediately following a meal.  . naproxen sodium (ALEVE) 220 MG tablet Take 220 mg by mouth daily as needed (pain).   Allergies  Allergen Reactions  . Shrimp [Shellfish Allergy] Anaphylaxis     Review of Systems    Objective:   BP 110/82 (BP Location: Left Arm, Patient Position: Sitting, Cuff Size: Normal)   Pulse  82   Resp 12   Ht 5' (1.524 m)   Wt 134 lb (60.8 kg)   LMP 12/14/2012   BMI 26.17 kg/m   Physical Exam  NAD HEENT:  PERRL, EOMI, TMs pearly gray, throat without injection Neck:  Supple, No adenpathy Lungs:  CTA Ches/MSt:  Tender at right sternal/costal margin more so than left.  Also tender over AC, CC on right and bilateral traps. CV:  RRR without murmur or rub.  Radial and DP pulses normal and equal Abd:  S, NT, No HSM or mass, + BS LE:  No edema.  Neuro:  A & O x 3, CN II-XII grossly intact.  Motor 5/5, DTRs 2+/4 Skin:  Clear.   Assessment & Plan   1.  Chest pain:  Appears more related to Musculoskeletal system.  PT referral.  Check sed rate.   2.  Psoriasis/?psoriatic arthritis:  As per Rheum  3.  Neurologic symptoms:  Referral to [facility] Neurology for evaluation.  Not fel to have a seizure disorder with evaluation locally/Dr. Leta Baptist.  Pt. Continues on anti seizure medication from Neurology office in [place], where she will no longer be going due to insurance issues. Never presented to ED/hospital with reported episode of facial drooping/slurring of speech.  4. Sliding/slipped rib syndrome:   PT/ortho  5.  IBS:  Antispasmodics (dicyclomine) as needed.  6.  Anemia:  CBC

## 2020-04-30 LAB — CBC WITH DIFFERENTIAL/PLATELET
Basophils Absolute: 0 10*3/uL (ref 0.0–0.2)
Basos: 1 %
EOS (ABSOLUTE): 0 10*3/uL (ref 0.0–0.4)
Eos: 1 %
Hematocrit: 35.5 % (ref 34.0–46.6)
Hemoglobin: 10.8 g/dL — ABNORMAL LOW (ref 11.1–15.9)
Immature Grans (Abs): 0 10*3/uL (ref 0.0–0.1)
Immature Granulocytes: 0 %
Lymphocytes Absolute: 1.9 10*3/uL (ref 0.7–3.1)
Lymphs: 41 %
MCH: 23.1 pg — ABNORMAL LOW (ref 26.6–33.0)
MCHC: 30.4 g/dL — ABNORMAL LOW (ref 31.5–35.7)
MCV: 76 fL — ABNORMAL LOW (ref 79–97)
Monocytes Absolute: 0.4 10*3/uL (ref 0.1–0.9)
Monocytes: 9 %
Neutrophils Absolute: 2.2 10*3/uL (ref 1.4–7.0)
Neutrophils: 48 %
Platelets: 347 10*3/uL (ref 150–450)
RBC: 4.67 x10E6/uL (ref 3.77–5.28)
RDW: 16.7 % — ABNORMAL HIGH (ref 11.7–15.4)
WBC: 4.6 10*3/uL (ref 3.4–10.8)

## 2020-04-30 LAB — SEDIMENTATION RATE: Sed Rate: 16 mm/hr (ref 0–32)

## 2020-05-07 ENCOUNTER — Other Ambulatory Visit: Payer: Self-pay

## 2020-05-07 ENCOUNTER — Encounter: Payer: Self-pay | Admitting: Physical Therapy

## 2020-05-07 ENCOUNTER — Ambulatory Visit: Payer: Medicaid Other | Attending: Internal Medicine | Admitting: Physical Therapy

## 2020-05-07 DIAGNOSIS — M546 Pain in thoracic spine: Secondary | ICD-10-CM

## 2020-05-07 DIAGNOSIS — R252 Cramp and spasm: Secondary | ICD-10-CM | POA: Insufficient documentation

## 2020-05-07 DIAGNOSIS — R0781 Pleurodynia: Secondary | ICD-10-CM

## 2020-05-07 NOTE — Patient Instructions (Signed)
Access Code: [email or web address] URL: [email or web address] Date: 05/07/2020 Prepared by: Amador Cunas  Exercises Scapular Retraction with Resistance - 1 x daily - 7 x weekly - 3 sets - 10 reps - 3 sec hold Corner Pec Major Stretch - 1 x daily - 7 x weekly - 3 sets - 2 reps - 20-30 sec hold Seated Thoracic Lumbar Extension with Pectoralis Stretch - 1 x daily - 7 x weekly - 3 sets - 5 reps - 5-10 sec hold Sidelying Thoracic Rotation with Open Book - 1 x daily - 7 x weekly - 3 sets - 10 reps - 5 sec hold Supine Diaphragmatic Breathing - 1 x daily - 7 x weekly - 3 sets - 10 reps

## 2020-05-07 NOTE — Therapy (Signed)
[place], [place], [address] Phone: [phone]   Fax:  [phone]  Physical Therapy Evaluation  Patient Details  Name: Stacy Jackson MRN: [id number] Date of Birth: Apr 30, 1981 Referring Provider (PT): [place]   Encounter Date: 05/07/2020   PT End of Session - 05/07/20 1404    Visit Number 1    Date for PT Re-Evaluation 07/07/20    PT Start Time 1316    PT Stop Time 1354    PT Time Calculation (min) 38 min    Activity Tolerance Patient tolerated treatment well    Behavior During Therapy [facility] for tasks assessed/performed           Past Medical History:  Diagnosis Date  . Abdominal pain, chronic, right lower quadrant   . Cervical cancer ([place]) 2015   Unsuccessful LEEP, then TAH  . 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 Complicated migraine 6144   Left arm numbness, states she may have had a "mini-stroke"  . Diabetes mellitus without complication ([place])   . Diverticulosis 02/15/2017  . GERD (gastroesophageal reflux disease)    with hiatal hernia.  fundoplication in 3154  . History of obesity 2010  . Ovarian cyst right   2008  . 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 Seizures ([place])   . Sickle cell anemia (HCC)   . Stroke [facility])     Past Surgical History:  Procedure Laterality Date  . CESAREAN SECTION  2003, 2008   2nd with left oophorectomy  . COLONOSCOPY WITH PROPOFOL N/A 11/22/2016   Procedure: COLONOSCOPY WITH PROPOFOL;  Surgeon: Arta Silence, MD;  [facility]: [facility];  Service: Endoscopy;  Laterality: N/A;  . ESOPHAGOGASTRODUODENOSCOPY  01/13/2015   Normal biopsy of gastric mucosa/gastric pouch  . NISSEN FUNDOPLICATION  0086  . OOPHORECTOMY Left 2008   Done at same time of Csection  . ROUX-EN-Y GASTRIC BYPASS  ?with Nissen fundoplication     Unknown year--found in GI records from [facility] as bariatric procedure.  Marland Kitchen VAGINAL HYSTERECTOMY  2015   For cervical cancer unresponsive to LEEP    There were no vitals filed for this visit.    Subjective Assessment - 05/07/20 1316    Subjective Pt reports that around this time last year she got COVID around this time and has experienced some difficulty breathing and pressure in upper R chest since then. Pt reports pain radiates into R shoulder. Pt reports that she has epilepsy and had a previous injury to R UE during seizure that left her with some permanent weakness and sensation deficits in RUE. Pt reports she is taking medication to control epilepsy and last seizure was 08/2019. Pt reports some N/T in RUE but states this is ongoing since prior seizure.    Pertinent History epilepsy, COVID 04/2019    Limitations House hold activities;Walking    Currently in Pain? Yes    Aggravating Factors  lying on it, reaching overhead, lifting    Pain Relieving Factors rest, stretching (sometimes)              [facility] PT Assessment - 05/07/20 0001      Assessment   Medical Diagnosis costochondritis    Referring Provider (PT) [place]    Hand Dominance Right    Next MD Visit --   3 months from now   Prior Therapy  PT for ankle and LB      Precautions   Precautions None      Restrictions   Weight Bearing Restrictions No      Balance Screen   Has the patient fallen in the past 6 months No    Has the patient had a decrease in activity level because of a fear of falling?  No    Is the patient reluctant to leave their home because of a fear of falling?  No      Home Environment   Additional Comments 5 stairs; pt reports no trouble with stairs      Prior Function   Level of Independence Independent    Vocation Full time employment    Vocation Requirements consultant/sales    Leisure spends time with kids      Posture/Postural Control   Posture/Postural Control Postural  limitations    Postural Limitations Forward head;Rounded Shoulders      ROM / Strength   AROM / PROM / Strength AROM;Strength      AROM   Overall AROM Comments Cervical ROM and UE ROM WFL; some tightness in R pectoral region noted with R shoulder ER and R shoulder abduction      Strength   Overall Strength Comments BUE WFL      Palpation   Palpation comment tender to palptaion B UT/biceps tendon/thoracic paraspinals R>L, tender along R upper costal region and R sternocostal joints                      Objective measurements completed on examination: See above findings.       [facility] Adult PT Treatment/Exercise - 05/07/20 0001      Neuro Re-ed    Neuro Re-ed Details  supine diaphragmatic breathing      Exercises   Exercises Shoulder;Neck      Neck Exercises: Theraband   Scapula Retraction 10 reps;Green      Shoulder Exercises: Stretch   Corner Stretch 2 reps;30 seconds    Other Shoulder Stretches thoracic extension with pec stretch over chair x5 5 sec hold    Other Shoulder Stretches sidelying open book thoracic rotations x10 B, 5 sec hold                  PT Education - 05/07/20 1403    Education Details Pt educated on POC and HEP    Person(s) Educated Patient    Methods Explanation;Demonstration;Handout    Comprehension Verbalized understanding;Returned demonstration            PT Short Term Goals - 05/07/20 1426      PT SHORT TERM GOAL #1   Title Pt will be independent with HEP    Time 2    Period Weeks    Status New    Target Date 05/21/20             PT Long Term Goals - 05/07/20 1426      PT LONG TERM GOAL #1   Title Pt will report ability to walk >30 min with no increase SOB    Time 6    Period Weeks    Status New    Target Date 06/18/20      PT LONG TERM GOAL #2   Title Pt will report resolution of tenderness in R upper ribs    Time 6    Period Weeks    Status New    Target Date 06/18/20  PT LONG TERM GOAL #3    Title Pt will report ability to reach overhead for ADLs with no increase in R rib/chest pain    Time 6    Period Weeks    Status New    Target Date 06/18/20      PT LONG TERM GOAL #4   Title Pt will report reduction in pain by 50%    Time 6    Period Weeks    Status New    Target Date 06/18/20                  Plan - 05/07/20 1405    Clinical Impression Statement Pt presents to clinic with reports of increased R rib and chest/shoulder pain and decreased endurance following COVID in 04/2019. Pt has been followed by primary care and has had EKG along with screening for pulmonary/cardiology issues; has been diagnosed with costochondritis. 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 Upon evaluation, pt demos pain in upper ribs at end range shoulder abd/ER, pain with palpation along B UT and mid thoracic paraspinals, and pain upon palpation of R upper ribs and sternum. Pt reports difficulty with endurance and taking a full breath following COVID; instructed in diaphragmatic breathing techniques and incorporation of walking program to increase aerobic capacity. Pt would benefit from skilled PT to address the above impairments.    Examination-Activity Limitations Reach Overhead;Lift    Examination-Participation Restrictions Community Activity;Interpersonal Relationship    Stability/Clinical Decision Making Stable/Uncomplicated    Clinical Decision Making Low    Rehab Potential Good    PT Frequency 2x / week    PT Duration 6 weeks    PT Treatment/Interventions ADLs/Self Care Home Management;Electrical Stimulation;Moist Heat;Iontophoresis 4mg /ml Dexamethasone;Therapeutic activities;Therapeutic exercise;Neuromuscular re-education;Manual techniques;Patient/family education;Passive range of motion;Dry needling;Taping    PT Next Visit Plan UE stretching/flexibility, scap stab/postural ex's, diaphragmatic breathing, work on increasing endurance    PT Home Exercise Plan diaphragmatic  breathing, scap retraction w/ green TB, corner pec stretch, thoracic ext over chair, sidelying open book thoracic rotation    Consulted and Agree with Plan of Care Patient           Patient will benefit from skilled therapeutic intervention in order to improve the following deficits and impairments:  Decreased endurance, Increased muscle spasms, Impaired UE functional use, Decreased activity tolerance, Pain, Impaired flexibility, Postural dysfunction  Visit Diagnosis: Rib pain on right side  Pain in thoracic spine  Cramp and spasm     Problem List Patient Active Problem List   Diagnosis Date Noted  . Hypertension 04/20/2020  . Tachycardia 04/20/2020  . Psoriasis 04/20/2020  . Palpitations 12/05/2019  . Slurred speech 12/05/2019  . Weakness on left side of face 12/05/2019  . Costochondritis 12/05/2019  . Skin rash 09/02/2019  . Abdominal pain, chronic, right lower quadrant   . Seizure disorder ([place])   . Seizure ([place]) 07/11/2018  . Herpes 09/19/2017  . Diverticulosis 02/15/2017  . Internal hemorrhoids 02/15/2017  . Hematochezia 11/16/2016  . History of diabetes mellitus, type II 08/17/2016  . GERD (gastroesophageal reflux disease) 08/17/2016  . Anemia 08/17/2016  . Chronic pelvic pain in female 06/24/2016   Amador Cunas, PT, DPT Donald Prose Nathanyel Defenbaugh 05/07/2020, 2:28 PM  [place], [place], [address] Phone: [phone]   Fax:  [phone]  Name: Stacy Jackson MRN: [id number] Date of Birth: 04-Nov-1980

## 2020-05-13 ENCOUNTER — Ambulatory Visit: Payer: Medicaid Other | Admitting: Physical Therapy

## 2020-05-13 ENCOUNTER — Other Ambulatory Visit: Payer: Self-pay

## 2020-05-13 DIAGNOSIS — R252 Cramp and spasm: Secondary | ICD-10-CM

## 2020-05-13 DIAGNOSIS — R0781 Pleurodynia: Secondary | ICD-10-CM

## 2020-05-13 DIAGNOSIS — M546 Pain in thoracic spine: Secondary | ICD-10-CM

## 2020-05-13 NOTE — Therapy (Signed)
[place], [place], [address] Phone: [phone]   Fax:  [phone]  Physical Therapy Treatment  Patient Details  Name: Stacy Jackson MRN: [id number] Date of Birth: [birth year] Referring Provider (PT): [place]   Encounter Date: 05/13/2020   PT End of Session - 05/13/20 1350    Visit Number 2    Date for PT Re-Evaluation 07/07/20    PT Start Time 2446    PT Stop Time 2863    PT Time Calculation (min) 38 min           Past Medical History:  Diagnosis Date  . Abdominal pain, chronic, right lower quadrant   . Cervical cancer ([place]) 2015   Unsuccessful LEEP, then TAH  . 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 Complicated migraine 8177   Left arm numbness, states she may have had a "mini-stroke"  . Diabetes mellitus without complication ([place])   . Diverticulosis 02/15/2017  . GERD (gastroesophageal reflux disease)    with hiatal hernia.  fundoplication in 1165  . History of obesity 2010  . Ovarian cyst right   2008  . 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 Seizures ([place])   . Sickle cell anemia (HCC)   . Stroke [facility])     Past Surgical History:  Procedure Laterality Date  . CESAREAN SECTION  2003, 2008   2nd with left oophorectomy  . COLONOSCOPY WITH PROPOFOL N/A 11/22/2016   Procedure: COLONOSCOPY WITH PROPOFOL;  Surgeon: Arta Silence, MD;  [facility];  Service: Endoscopy;  Laterality: N/A;  . ESOPHAGOGASTRODUODENOSCOPY  01/13/2015   Normal biopsy of gastric mucosa/gastric pouch  . NISSEN FUNDOPLICATION  7903  . OOPHORECTOMY Left 2008   Done at same time of Csection  . ROUX-EN-Y GASTRIC BYPASS  ?with Nissen fundoplication   Unknown year--found in GI records from [facility] as bariatric procedure.  Marland Kitchen VAGINAL HYSTERECTOMY  2015   For  cervical cancer unresponsive to LEEP    There were no vitals filed for this visit.   Subjective Assessment - 05/13/20 1318    Subjective ex are good at home, leaning over chair is hardest one. N/T RT shld    Currently in Pain? Yes    Pain Score 7     Pain Location Chest    Pain Orientation Right                             [facility] Adult PT Treatment/Exercise - 05/13/20 0001      Neck Exercises: Machines for Strengthening   UBE (Upper Arm Bike) L 3 2 fwd/2 back      Neck Exercises: Theraband   Shoulder Extension 20 reps;Red    Rows 20 reps;Red    Shoulder External Rotation 20 reps;Red    Horizontal ABduction 20 reps;Red      Neck Exercises: Supine   Neck Retraction 10 reps;3 secs      Shoulder Exercises: Standing   Other Standing Exercises ball vs wall 5 CC and 5 CCW    Other Standing Exercises red ball wt OH ext to wall 10 x      Shoulder Exercises: ROM/Strengthening   Lat Pull 10 reps   15#   Cybex Row 10 reps   15#     Manual Therapy  Manual Therapy Soft tissue mobilization;Neural Stretch;Passive ROM    Soft tissue mobilization RT chest wall    Passive ROM RT UE on foam roll    Neural Stretch RT UE +                  PT Education - 05/13/20 1349    Education Details added to HEP tband shdl ext and ER, neural tension stretching    Person(s) Educated Patient    Methods Explanation;Demonstration;Handout    Comprehension Verbalized understanding;Returned demonstration            PT Short Term Goals - 05/13/20 1350      PT SHORT TERM GOAL #1   Title Pt will be independent with HEP    Status Achieved             PT Long Term Goals - 05/07/20 1426      PT LONG TERM GOAL #1   Title Pt will report ability to walk >30 min with no increase SOB    Time 6    Period Weeks    Status New    Target Date 06/18/20      PT LONG TERM GOAL #2   Title Pt will report resolution of tenderness in R upper ribs    Time 6    Period Weeks     Status New    Target Date 06/18/20      PT LONG TERM GOAL #3   Title Pt will report ability to reach overhead for ADLs with no increase in R rib/chest pain    Time 6    Period Weeks    Status New    Target Date 06/18/20      PT LONG TERM GOAL #4   Title Pt will report reduction in pain by 50%    Time 6    Period Weeks    Status New    Target Date 06/18/20                 Plan - 05/13/20 1350    Clinical Impression Statement STG met with initail HEP and HEP added today ,plus neural tension stretch. tolerated intial ther ex progression well with cuing and c/o min pain mostly "good" stretching feeling. tightness in chest wall with STW and + NT    PT Treatment/Interventions ADLs/Self Care Home Management;Electrical Stimulation;Moist Heat;Iontophoresis 69m/ml Dexamethasone;Therapeutic activities;Therapeutic exercise;Neuromuscular re-education;Manual techniques;Patient/family education;Passive range of motion;Dry needling;Taping    PT Next Visit Plan UE stretching/flexibility, scap stab/postural ex's, diaphragmatic breathing, work on increasing endurance           Patient will benefit from skilled therapeutic intervention in order to improve the following deficits and impairments:  Decreased endurance, Increased muscle spasms, Impaired UE functional use, Decreased activity tolerance, Pain, Impaired flexibility, Postural dysfunction  Visit Diagnosis: Rib pain on right side  Pain in thoracic spine  Cramp and spasm     Problem List Patient Active Problem List   Diagnosis Date Noted  . Hypertension 04/20/2020  . Tachycardia 04/20/2020  . Psoriasis 04/20/2020  . Palpitations 12/05/2019  . Slurred speech 12/05/2019  . Weakness on left side of face 12/05/2019  . Costochondritis 12/05/2019  . Skin rash 09/02/2019  . Abdominal pain, chronic, right lower quadrant   . Seizure disorder ([place]   . Seizure ([place] 07/11/2018  . Herpes 09/19/2017  . Diverticulosis 02/15/2017  .  Internal hemorrhoids 02/15/2017  . Hematochezia 11/16/2016  . History of diabetes mellitus, type II 08/17/2016  .  GERD (gastroesophageal reflux disease) 08/17/2016  . Anemia 08/17/2016  . Chronic pelvic pain in female 06/24/2016    Tihanna Goodson,ANGIE PTA 05/13/2020, 1:52 PM  [place], [place], [address] Phone: [phone]   Fax:  [phone]  Name: Daija Routson MRN: [id number] Date of Birth: 1981/06/23

## 2020-05-15 ENCOUNTER — Ambulatory Visit: Payer: Medicaid Other | Admitting: Physical Therapy

## 2020-05-15 ENCOUNTER — Encounter: Payer: Self-pay | Admitting: Physical Therapy

## 2020-05-15 ENCOUNTER — Other Ambulatory Visit: Payer: Self-pay

## 2020-05-15 DIAGNOSIS — R0781 Pleurodynia: Secondary | ICD-10-CM | POA: Diagnosis not present

## 2020-05-15 DIAGNOSIS — M546 Pain in thoracic spine: Secondary | ICD-10-CM

## 2020-05-15 DIAGNOSIS — R252 Cramp and spasm: Secondary | ICD-10-CM

## 2020-05-15 NOTE — Therapy (Signed)
[place], [place], [address] Phone: [phone]   Fax:  [phone]  Physical Therapy Treatment  Patient Details  Name: Stacy Jackson MRN: [id number] Date of Birth: 03/20/1981 Referring Provider (PT): [place]   Encounter Date: 05/15/2020   PT End of Session - 05/15/20 1322    Visit Number 3    Date for PT Re-Evaluation 07/07/20    PT Start Time 1322    PT Stop Time 1408   heat at end of session   PT Time Calculation (min) 46 min    Activity Tolerance Patient tolerated treatment well    Behavior During Therapy [facility] for tasks assessed/performed           Past Medical History:  Diagnosis Date  . Abdominal pain, chronic, right lower quadrant   . Cervical cancer ([place]) 2015   Unsuccessful LEEP, then TAH  . 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 Complicated migraine 4944   Left arm numbness, states she may have had a "mini-stroke"  . Diabetes mellitus without complication ([place])   . Diverticulosis 02/15/2017  . GERD (gastroesophageal reflux disease)    with hiatal hernia.  fundoplication in 9675  . History of obesity 2010  . Ovarian cyst right   2008  . 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 Seizures ([place])   . Sickle cell anemia (HCC)   . Stroke [facility])     Past Surgical History:  Procedure Laterality Date  . CESAREAN SECTION  2003, 2008   2nd with left oophorectomy  . COLONOSCOPY WITH PROPOFOL N/A 11/22/2016   Procedure: COLONOSCOPY WITH PROPOFOL;  Surgeon: Arta Silence, MD;  [facility];  Service: Endoscopy;  Laterality: N/A;  . ESOPHAGOGASTRODUODENOSCOPY  01/13/2015   Normal biopsy of gastric mucosa/gastric pouch  . NISSEN FUNDOPLICATION  9163  . OOPHORECTOMY Left 2008   Done at same time of Csection  . ROUX-EN-Y GASTRIC BYPASS  ?with  Nissen fundoplication   Unknown year--found in GI records from [facility] as bariatric procedure.  Marland Kitchen VAGINAL HYSTERECTOMY  2015   For cervical cancer unresponsive to LEEP    There were no vitals filed for this visit.   Subjective Assessment - 05/15/20 1323    Subjective Pt reports she is doing ok, the pain had changed.  IT is now a pressing pain in the front of the Rt shoulder/chest.    Currently in Pain? Yes    Pain Score 6     Pain Location Chest    Pain Orientation Right    Pain Descriptors / Indicators Other (Comment)   pressing pain   Pain Type Chronic pain    Pain Onset More than a month ago    Pain Frequency Constant                             [facility] Adult PT Treatment/Exercise - 05/15/20 0001      Neck Exercises: Machines for Strengthening   UBE (Upper Arm Bike) L3x5' alt FWD/BWD       Neck Exercises: Supine   Other Supine Exercise 10 3 sec hold elbow presses with hands behind head.     Other Supine Exercise 2x10 reps, red band on bolster, overhead pull, ER, horizontal abduction and SASH, only 10 reps of ER d/t increase  in ant shoulder pain with this        Shoulder Exercises: ROM/Strengthening   Lat Pull 10 reps    Lat Pull Limitations 15#    Cybex Row 10 reps    Cybex Row Limitations 15#      Shoulder Exercises: Stretch   Other Shoulder Stretches 20 sec holds, 3 way doorway       Modalities   Modalities Moist Heat      Moist Heat Therapy   Number Minutes Moist Heat 12 Minutes    Moist Heat Location --   Rt pecs     Manual Therapy   Manual Therapy Soft tissue mobilization;Myofascial release;Joint mobilization    Joint Mobilization Rt GH posterior mobs grade II    Soft tissue mobilization STM to Rt chest wall/pecs    Myofascial Release Rt arm pull                    PT Short Term Goals - 05/13/20 1350      PT SHORT TERM GOAL #1   Title Pt will be independent with HEP    Status Achieved             PT Long Term  Goals - 05/07/20 1426      PT LONG TERM GOAL #1   Title Pt will report ability to walk >30 min with no increase SOB    Time 6    Period Weeks    Status New    Target Date 06/18/20      PT LONG TERM GOAL #2   Title Pt will report resolution of tenderness in R upper ribs    Time 6    Period Weeks    Status New    Target Date 06/18/20      PT LONG TERM GOAL #3   Title Pt will report ability to reach overhead for ADLs with no increase in R rib/chest pain    Time 6    Period Weeks    Status New    Target Date 06/18/20      PT LONG TERM GOAL #4   Title Pt will report reduction in pain by 50%    Time 6    Period Weeks    Status New    Target Date 06/18/20                 Plan - 05/15/20 1358    Clinical Impression Statement Erick tolerated ther ex well, did have slight increase in anterior shoulder/pec pain with resisted ER on bolster,  It resolved once exercise was stopped.She is making good progress to her goals.    Rehab Potential Good    PT Frequency 2x / week    PT Duration 6 weeks    PT Treatment/Interventions ADLs/Self Care Home Management;Electrical Stimulation;Moist Heat;Iontophoresis 4mg /ml Dexamethasone;Therapeutic activities;Therapeutic exercise;Neuromuscular re-education;Manual techniques;Patient/family education;Passive range of motion;Dry needling;Taping    PT Next Visit Plan UE stretching/flexibility, scap stab/postural ex's, diaphragmatic breathing, work on increasing endurance    Consulted and Agree with Plan of Care Patient           Patient will benefit from skilled therapeutic intervention in order to improve the following deficits and impairments:  Decreased endurance, Increased muscle spasms, Impaired UE functional use, Decreased activity tolerance, Pain, Impaired flexibility, Postural dysfunction  Visit Diagnosis: Rib pain on right side  Pain in thoracic spine  Cramp and spasm     Problem List Patient Active Problem List  Diagnosis  Date Noted  . Hypertension 04/20/2020  . Tachycardia 04/20/2020  . Psoriasis 04/20/2020  . Palpitations 12/05/2019  . Slurred speech 12/05/2019  . Weakness on left side of face 12/05/2019  . Costochondritis 12/05/2019  . Skin rash 09/02/2019  . Abdominal pain, chronic, right lower quadrant   . Seizure disorder ([place])   . Seizure ([place]) 07/11/2018  . Herpes 09/19/2017  . Diverticulosis 02/15/2017  . Internal hemorrhoids 02/15/2017  . Hematochezia 11/16/2016  . History of diabetes mellitus, type II 08/17/2016  . GERD (gastroesophageal reflux disease) 08/17/2016  . Anemia 08/17/2016  . Chronic pelvic pain in female 06/24/2016    Jeral Pinch PT  05/15/2020, 2:03 PM  [place], [place], [address] Phone: [phone]   Fax:  [phone]  Name: Zinnia Tindall MRN: [id number] Date of Birth: 25-Feb-1981

## 2020-05-20 ENCOUNTER — Ambulatory Visit: Payer: Medicaid Other | Admitting: Physical Therapy

## 2020-05-22 ENCOUNTER — Ambulatory Visit: Payer: Medicaid Other | Attending: Internal Medicine | Admitting: Physical Therapy

## 2020-05-22 ENCOUNTER — Other Ambulatory Visit: Payer: Self-pay

## 2020-05-22 DIAGNOSIS — R0781 Pleurodynia: Secondary | ICD-10-CM

## 2020-05-22 DIAGNOSIS — M546 Pain in thoracic spine: Secondary | ICD-10-CM | POA: Diagnosis present

## 2020-05-22 DIAGNOSIS — R252 Cramp and spasm: Secondary | ICD-10-CM | POA: Insufficient documentation

## 2020-05-22 NOTE — Therapy (Signed)
[place], [place], [address] Phone: [phone]   Fax:  [phone]  Physical Therapy Treatment  Patient Details  Name: Stacy Jackson MRN: [id number] Date of Birth: May 12, 1981 Referring Provider (PT): Mulberry   Encounter Date: 05/22/2020   PT End of Session - 05/22/20 1416    Visit Number 4    Date for PT Re-Evaluation 07/07/20    PT Start Time 3664    PT Stop Time 1446    PT Time Calculation (min) 34 min           Past Medical History:  Diagnosis Date  . Abdominal pain, chronic, right lower quadrant   . Cervical cancer ([place]) 2015   Unsuccessful LEEP, then TAH  . 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 Complicated migraine 4034   Left arm numbness, states she may have had a "mini-stroke"  . Diabetes mellitus without complication ([place])   . Diverticulosis 02/15/2017  . GERD (gastroesophageal reflux disease)    with hiatal hernia.  fundoplication in 7425  . History of obesity 2010  . Ovarian cyst right   2008  . 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 Seizures ([place])   . Sickle cell anemia (HCC)   . Stroke [facility])     Past Surgical History:  Procedure Laterality Date  . CESAREAN SECTION  2003, 2008   2nd with left oophorectomy  . COLONOSCOPY WITH PROPOFOL N/A 11/22/2016   Procedure: COLONOSCOPY WITH PROPOFOL;  Surgeon: Arta Silence, MD;  [facility];  Service: Endoscopy;  Laterality: N/A;  . ESOPHAGOGASTRODUODENOSCOPY  01/13/2015   Normal biopsy of gastric mucosa/gastric pouch  . NISSEN FUNDOPLICATION  9563  . OOPHORECTOMY Left 2008   Done at same time of Csection  . ROUX-EN-Y GASTRIC BYPASS  ?with Nissen fundoplication   Unknown year--found [facility] as bariatric procedure.  Marland Kitchen VAGINAL HYSTERECTOMY  2015   For  cervical cancer unresponsive to LEEP    There were no vitals filed for this visit.   Subjective Assessment - 05/22/20 1413    Subjective 12 min late. pain is not as bad ,I think PT is helping. pt verb doing HEP    Currently in Pain? Yes    Pain Score 6     Pain Location Chest    Pain Orientation Right                             [facility] Adult PT Treatment/Exercise - 05/22/20 0001      Neck Exercises: Machines for Strengthening   UBE (Upper Arm Bike) L3 3 min fwd/ 3 min backward      Shoulder Exercises: Standing   Other Standing Exercises 3 pt rhy stab with tband and hands on wall    Other Standing Exercises 3# UE flex,abd and PNF 2 sets 10      Shoulder Exercises: ROM/Strengthening   Lat Pull 20 reps   20#   Cybex Press 20 reps   15# 2 nd set had to decrease wt to 10#   Cybex Row 20 reps   20#   Other ROM/Strengthening Exercises seated trunk ext with black tband 2 sets 10      Manual Therapy   Manual Therapy Soft tissue mobilization;Myofascial release;Joint mobilization  Joint Mobilization Rt GH posterior mobs grade II    Soft tissue mobilization STM to Rt chest wall/pecs    Myofascial Release Rt arm pull                    PT Short Term Goals - 05/13/20 1350      PT SHORT TERM GOAL #1   Title Pt will be independent with HEP    Status Achieved             PT Long Term Goals - 05/22/20 1415      PT LONG TERM GOAL #1   Title Pt will report ability to walk >30 min with no increase SOB    Status On-going      PT LONG TERM GOAL #2   Title Pt will report resolution of tenderness in R upper ribs    Status On-going      PT LONG TERM GOAL #3   Title Pt will report ability to reach overhead for ADLs with no increase in R rib/chest pain    Status On-going      PT LONG TERM GOAL #4   Title Pt will report reduction in pain by 50%    Status On-going                 Plan - 05/22/20 1447    Clinical Impression Statement pt  tolerated session well, some postural cuing needed. pt had tightness an dTP in RT chest wall that felt better after MT.progressing with goals    PT Treatment/Interventions ADLs/Self Care Home Management;Electrical Stimulation;Moist Heat;Iontophoresis 4mg /ml Dexamethasone;Therapeutic activities;Therapeutic exercise;Neuromuscular re-education;Manual techniques;Patient/family education;Passive range of motion;Dry needling;Taping    PT Next Visit Plan UE stretching/flexibility, scap stab/postural ex's, diaphragmatic breathing, work on increasing endurance           Patient will benefit from skilled therapeutic intervention in order to improve the following deficits and impairments:  Decreased endurance, Increased muscle spasms, Impaired UE functional use, Decreased activity tolerance, Pain, Impaired flexibility, Postural dysfunction  Visit Diagnosis: Rib pain on right side  Pain in thoracic spine  Cramp and spasm     Problem List Patient Active Problem List   Diagnosis Date Noted  . Hypertension 04/20/2020  . Tachycardia 04/20/2020  . Psoriasis 04/20/2020  . Palpitations 12/05/2019  . Slurred speech 12/05/2019  . Weakness on left side of face 12/05/2019  . Costochondritis 12/05/2019  . Skin rash 09/02/2019  . Abdominal pain, chronic, right lower quadrant   . Seizure disorder ([place])   . Seizure ([place]) 07/11/2018  . Herpes 09/19/2017  . Diverticulosis 02/15/2017  . Internal hemorrhoids 02/15/2017  . Hematochezia 11/16/2016  . History of diabetes mellitus, type II 08/17/2016  . GERD (gastroesophageal reflux disease) 08/17/2016  . Anemia 08/17/2016  . Chronic pelvic pain in female 06/24/2016    Alexzia Kasler,ANGIE PTA 05/22/2020, 2:48 PM  [place], [place], [address] Phone: [phone]   Fax:  [phone]  Name: Ariele Vidrio MRN: [id number] Date of Birth: 1980-10-26

## 2020-05-23 ENCOUNTER — Other Ambulatory Visit: Payer: Self-pay

## 2020-05-23 MED ORDER — DULOXETINE HCL 60 MG PO CPEP: ORAL_CAPSULE | ORAL | 11 refills | Status: DC

## 2020-05-27 ENCOUNTER — Other Ambulatory Visit: Payer: Self-pay

## 2020-05-27 ENCOUNTER — Ambulatory Visit: Payer: Medicaid Other | Admitting: Physical Therapy

## 2020-05-27 DIAGNOSIS — R0781 Pleurodynia: Secondary | ICD-10-CM

## 2020-05-27 DIAGNOSIS — M546 Pain in thoracic spine: Secondary | ICD-10-CM

## 2020-05-27 NOTE — Therapy (Signed)
Stacy Jackson, [address] Phone: [phone]   Fax:  [phone]  Physical Therapy Treatment  Patient Details  Name: Stacy Jackson MRN: [id number] Date of Birth: 02/23/1981 Referring Provider (PT): Mulberry   Encounter Date: 05/27/2020   PT End of Session - 05/27/20 1351    Visit Number 5    Date for PT Re-Evaluation 07/07/20    PT Start Time 1315    PT Stop Time 7416    PT Time Calculation (min) 40 min           Past Medical History:  Diagnosis Date  . Abdominal pain, chronic, right lower quadrant   . Cervical cancer ([place]) 2015   Unsuccessful LEEP, then TAH  . 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 Complicated migraine 3845   Left arm numbness, states she may have had a "mini-stroke"  . Diabetes mellitus without complication ([place])   . Diverticulosis 02/15/2017  . GERD (gastroesophageal reflux disease)    with hiatal hernia.  fundoplication in 3646  . History of obesity 2010  . Ovarian cyst right   2008  . 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 Seizures ([place])   . Sickle cell anemia (HCC)   . Stroke [facility])     Past Surgical History:  Procedure Laterality Date  . CESAREAN SECTION  2003, 2008   2nd with left oophorectomy  . COLONOSCOPY WITH PROPOFOL N/A 11/22/2016   Procedure: COLONOSCOPY WITH PROPOFOL;  Surgeon: Arta Silence, MD;  [facility];  Service: Endoscopy;  Laterality: N/A;  . ESOPHAGOGASTRODUODENOSCOPY  01/13/2015   Normal biopsy of gastric mucosa/gastric pouch  . NISSEN FUNDOPLICATION  8032  . OOPHORECTOMY Left 2008   Done at same time of Csection  . ROUX-EN-Y GASTRIC BYPASS  ?with Nissen fundoplication   Unknown year--found in GI records from [facility] as bariatric procedure.  Marland Kitchen VAGINAL HYSTERECTOMY  2015   For  cervical cancer unresponsive to LEEP    There were no vitals filed for this visit.   Subjective Assessment - 05/27/20 1319    Subjective 50% better overall    Currently in Pain? Yes    Pain Score 6     Pain Location Chest    Pain Orientation Right                             [facility] Adult PT Treatment/Exercise - 05/27/20 0001      Neck Exercises: Machines for Strengthening   UBE (Upper Arm Bike) L3 3 min fwd/ 3 min backward    Nustep L 4 6 min      Neck Exercises: Theraband   Scapula Retraction 20 reps   cablepulleys 10# each   Shoulder Extension 20 reps   cable pulleys 10# each side   Horizontal ABduction 20 reps   cable pulleys 10# each side   Other Theraband Exercises shld flex and chest press 10x cable pulleys     Other Theraband Exercises AR press- cable pulleys      Shoulder Exercises: Supine   Other Supine Exercises wt ball chest press into flexion 10 reps    Other Supine Exercises Rt UE 3# stab ex      Manual Therapy   Manual Therapy Soft tissue mobilization;Myofascial release;Joint mobilization  Joint Mobilization Rt GH posterior mobs grade II    Soft tissue mobilization STM to Rt chest wall/pecs    Myofascial Release Rt arm pull                    PT Short Term Goals - 05/13/20 1350      PT SHORT TERM GOAL #1   Title Pt will be independent with HEP    Status Achieved             PT Long Term Goals - 05/27/20 1337      PT LONG TERM GOAL #1   Title Pt will report ability to walk >30 min with no increase SOB    Baseline 5 min    Status Partially Met      PT LONG TERM GOAL #2   Title Pt will report resolution of tenderness in R upper ribs    Status Partially Met      PT LONG TERM GOAL #3   Title Pt will report ability to reach overhead for ADLs with no increase in R rib/chest pain    Status Partially Met      PT LONG TERM GOAL #4   Title Pt will report reduction in pain by 50%    Status Achieved                  Plan - 05/27/20 1351    Clinical Impression Statement progressing with goals, less paina nd increased func but c/o only walking 5 min then SOB, increased cardio and asked pt to sart walking program at home to increase tolerance and she VU. increased ther ex with cable pulleys, no pain ( except with AR circles) but fatigue and weakness noted. good PROM/stretch and much less tightness /tenderness in RT pec    PT Treatment/Interventions ADLs/Self Care Home Management;Electrical Stimulation;Moist Heat;Iontophoresis 55m/ml Dexamethasone;Therapeutic activities;Therapeutic exercise;Neuromuscular re-education;Manual techniques;Patient/family education;Passive range of motion;Dry needling;Taping    PT Next Visit Plan UE stretching/flexibility, scap stab/postural ex's, diaphragmatic breathing, work on increasing endurance           Patient will benefit from skilled therapeutic intervention in order to improve the following deficits and impairments:  Decreased endurance, Increased muscle spasms, Impaired UE functional use, Decreased activity tolerance, Pain, Impaired flexibility, Postural dysfunction  Visit Diagnosis: Pain in thoracic spine  Rib pain on right side     Problem List Patient Active Problem List   Diagnosis Date Noted  . Hypertension 04/20/2020  . Tachycardia 04/20/2020  . Psoriasis 04/20/2020  . Palpitations 12/05/2019  . Slurred speech 12/05/2019  . Weakness on left side of face 12/05/2019  . Costochondritis 12/05/2019  . Skin rash 09/02/2019  . Abdominal pain, chronic, right lower quadrant   . Seizure disorder ([place]   . [place]iz[place] ([place] 07/11/2018  . Herpes 09/19/2017  . Diverticulosis 02/15/2017  . Internal hemorrhoids 02/15/2017  . Hematochezia 11/16/2016  . History of diabetes mellitus, type II 08/17/2016  . GERD (gastroesophageal reflux disease) 08/17/2016  . Anemia 08/17/2016  . Chronic pelvic pain in female 06/24/2016    Stacy Jackson,Stacy Jackson 05/27/2020,  1:53 PM  [place] [address]Phone: [phone]  Fax:  [phone] Name: RTeka ChandaMRN: [id number] of Birth: [birth year]

## 2020-05-29 ENCOUNTER — Other Ambulatory Visit: Payer: Self-pay

## 2020-05-29 ENCOUNTER — Ambulatory Visit: Payer: Medicaid Other | Admitting: Physical Therapy

## 2020-05-29 ENCOUNTER — Encounter: Payer: Self-pay | Admitting: Physical Therapy

## 2020-05-29 DIAGNOSIS — R0781 Pleurodynia: Secondary | ICD-10-CM

## 2020-05-29 DIAGNOSIS — R252 Cramp and spasm: Secondary | ICD-10-CM

## 2020-05-29 DIAGNOSIS — M546 Pain in thoracic spine: Secondary | ICD-10-CM

## 2020-05-29 NOTE — Therapy (Signed)
[place], [place], [address] Phone: [phone]   Fax:  [phone]  Physical Therapy Treatment  Patient Details  Name: Stacy Jackson MRN: [id number] Date of Birth: 04-02-1981 Referring Provider (PT): Mulberry   Encounter Date: 05/29/2020   PT End of Session - 05/29/20 1455    Visit Number 6    Date for PT Re-Evaluation 07/07/20    PT Start Time 1401    PT Stop Time 1445    PT Time Calculation (min) 44 min    Activity Tolerance Patient tolerated treatment well    Behavior During Therapy [facility] for tasks assessed/performed           Past Medical History:  Diagnosis Date  . Abdominal pain, chronic, right lower quadrant   . Cervical cancer ([place]) 2015   Unsuccessful LEEP, then TAH  . 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 Complicated migraine 7672   Left arm numbness, states she may have had a "mini-stroke"  . Diabetes mellitus without complication ([place])   . Diverticulosis 02/15/2017  . GERD (gastroesophageal reflux disease)    with hiatal hernia.  fundoplication in 0947  . History of obesity 2010  . Ovarian cyst right   2008  . 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 Seizures ([place])   . Sickle cell anemia (HCC)   . Stroke [facility])     Past Surgical History:  Procedure Laterality Date  . CESAREAN SECTION  2003, 2008   2nd with left oophorectomy  . COLONOSCOPY WITH PROPOFOL N/A 11/22/2016   Procedure: COLONOSCOPY WITH PROPOFOL;  Surgeon: Arta Silence, MD;  [facility];  Service: Endoscopy;  Laterality: N/A;  . ESOPHAGOGASTRODUODENOSCOPY  01/13/2015   Normal biopsy of gastric mucosa/gastric pouch  . NISSEN FUNDOPLICATION  0962  . OOPHORECTOMY Left 2008   Done at same time of Csection  . ROUX-EN-Y GASTRIC BYPASS  ?with Nissen fundoplication    Unknown year--found in GI records from [facility] as bariatric procedure.  Marland Kitchen VAGINAL HYSTERECTOMY  2015   For cervical cancer unresponsive to LEEP    There were no vitals filed for this visit.   Subjective Assessment - 05/29/20 1404    Subjective Pt reports doing well; no increased soreness after last rx    Currently in Pain? Yes    Pain Score 5     Pain Location Chest    Pain Orientation Right                             [facility] Adult PT Treatment/Exercise - 05/29/20 0001      Neck Exercises: Machines for Strengthening   UBE (Upper Arm Bike) L3 3 min fwd/ 3 min backward    Other Machines for Strengthening recumbent bike L1 x 7 min    Other Machines for Strengthening elliptical x4 min fwd/2 min bkwd      Neck Exercises: Theraband   Scapula Retraction Limitations 15# 2x10    Shoulder Extension Limitations 10# 2x10    Other Theraband Exercises lat pulldown 15# 2x10    Other Theraband Exercises AR press with rotation- cable pulleys      Manual Therapy   Manual Therapy Soft tissue mobilization;Joint mobilization    Joint Mobilization Rt GH posterior mobs grade II    Soft tissue  mobilization STM to Rt chest wall/pecs                    PT Short Term Goals - 05/13/20 1350      PT SHORT TERM GOAL #1   Title Pt will be independent with HEP    Status Achieved             PT Long Term Goals - 05/27/20 1337      PT LONG TERM GOAL #1   Title Pt will report ability to walk >30 min with no increase SOB    Baseline 5 min    Status Partially Met      PT LONG TERM GOAL #2   Title Pt will report resolution of tenderness in R upper ribs    Status Partially Met      PT LONG TERM GOAL #3   Title Pt will report ability to reach overhead for ADLs with no increase in R rib/chest pain    Status Partially Met      PT LONG TERM GOAL #4   Title Pt will report reduction in pain by 50%    Status Achieved                 Plan - 05/29/20 1513     Clinical Impression Statement Pt demos increased tolerance to resistance ex this rx; did not have increased pain/soreness following increase in therex last rx. Continued with machine interventions. Pt reports relief with manual stretching. Dropped frequency to 1x/week with education on importance of HEP and pt VU. Endurance remains a limiting factor.    PT Treatment/Interventions ADLs/Self Care Home Management;Electrical Stimulation;Moist Heat;Iontophoresis 14m/ml Dexamethasone;Therapeutic activities;Therapeutic exercise;Neuromuscular re-education;Manual techniques;Patient/family education;Passive range of motion;Dry needling;Taping    PT Next Visit Plan UE stretching/flexibility, scap stab/postural ex's, diaphragmatic breathing, work on increasing endurance    Consulted and Agree with Plan of Care Patient           Patient will benefit from skilled therapeutic intervention in order to improve the following deficits and impairments:  Decreased endurance, Increased muscle spasms, Impaired UE functional use, Decreased activity tolerance, Pain, Impaired flexibility, Postural dysfunction  Visit Diagnosis: Pain in thoracic spine  Rib pain on right side  Cramp and spasm     Problem List Patient Active Problem List   Diagnosis Date Noted  . Hypertension 04/20/2020  . Tachycardia 04/20/2020  . Psoriasis 04/20/2020  . Palpitations 12/05/2019  . Slurred speech 12/05/2019  . Weakness on left side of face 12/05/2019  . Costochondritis 12/05/2019  . Skin rash 09/02/2019  . Abdominal pain, chronic, right lower quadrant   . [place]ure disorder ([place]   . Seizure ([place] 07/11/2018  . Herpes 09/19/2017  . Diverticulosis 02/15/2017  . Internal hemorrhoids 02/15/2017  . Hematochezia 11/16/2016  . History of diabetes mellitus, type II 08/17/2016  . GERD (gastroesophageal reflux disease) 08/17/2016  . Anemia 08/17/2016  . Chronic pelvic pain in female 06/24/2016   AAmador Cunas PT, DPT ADonald Prose Dorothe Elmore 05/29/2020, 3:31 PM  [place] [address]Phone: [phone]  Fax:  [phone] Name: RKobe JansmaMRN: [id number] of Birth: 1Aug 01, 1982

## 2020-06-03 ENCOUNTER — Ambulatory Visit: Payer: Medicaid Other | Admitting: Physical Therapy

## 2020-06-10 ENCOUNTER — Ambulatory Visit: Payer: Medicaid Other | Admitting: Physical Therapy

## 2020-06-17 ENCOUNTER — Ambulatory Visit: Payer: Medicaid Other | Admitting: Physical Therapy

## 2020-06-17 ENCOUNTER — Other Ambulatory Visit: Payer: Self-pay

## 2020-06-17 DIAGNOSIS — R0781 Pleurodynia: Secondary | ICD-10-CM | POA: Diagnosis not present

## 2020-06-17 DIAGNOSIS — M546 Pain in thoracic spine: Secondary | ICD-10-CM

## 2020-06-17 NOTE — Therapy (Signed)
[place] 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   Seizures ([place])    Sickle cell anemia ([place])    Stroke [facility])     Past Surgical History:  Procedure Laterality Date   CESAREAN SECTION  2003, 2008   2nd with left oophorectomy   COLONOSCOPY WITH PROPOFOL N/A 11/22/2016   Procedure: COLONOSCOPY WITH PROPOFOL;  Surgeon: Arta Silence, MD;  [facility];  Service: Endoscopy;  Laterality: N/A;   ESOPHAGOGASTRODUODENOSCOPY  01/13/2015   Normal biopsy of gastric mucosa/gastric pouch   NISSEN FUNDOPLICATION  2025   OOPHORECTOMY Left 2008   Done at same time of Csection   ROUX-EN-Y GASTRIC BYPASS  ?with Nissen fundoplication   Unknown year--found in GI records from [facility] as bariatric procedure.   VAGINAL HYSTERECTOMY  2015   For cervical  cancer unresponsive to LEEP    There were no vitals filed for this visit.   Subjective Assessment - 06/17/20 1403    Subjective I had the flu is why I have not been here. RT ant shld pain always there. seeing pain MD tomorrow    Currently in Pain? Yes    Pain Score 6     Pain Location Shoulder    Pain Orientation Right                             [facility] Adult PT Treatment/Exercise - 06/17/20 0001      Neck Exercises: Machines for Strengthening   UBE (Upper Arm Bike) L3 3 min fwd/ 3 min backward   STANDING- had to sit after 4 min   Nustep L 6 8 min    Other Machines for Strengthening elliptical x4 min fwd/2 min bkwd      Shoulder Exercises: Standing   Flexion Strengthening;Both;15 reps   pulleys 5 #   Extension Strengthening;Both;15 reps   5# pulleys   Row Strengthening;Both;15 reps;Weights   5# pulleys   Other Standing Exercises wt ball trampiline throw flex and abd 10 each      Shoulder Exercises: ROM/Strengthening   Lat Pull 20 reps   20#     Manual Therapy   Manual Therapy Soft tissue mobilization;Joint mobilization  Joint Mobilization Rt GH posterior mobs grade II    Soft tissue mobilization STM to Rt chest wall/pecs                    PT Short Term Goals - 05/13/20 1350      PT SHORT TERM GOAL #1   Title Pt will be independent with HEP    Status Achieved             PT Long Term Goals - 06/17/20 1405      PT LONG TERM GOAL #1   Title Pt will report ability to walk >30 min with no increase SOB    Baseline pt verb about 10 min    Status Partially Met      PT LONG TERM GOAL #2   Title Pt will report resolution of tenderness in R upper ribs    Status Partially Met      PT LONG TERM GOAL #3   Title Pt will report ability to reach overhead for ADLs with no increase in R rib/chest pain    Status Partially Met      PT LONG TERM GOAL #4   Title Pt will report reduction in pain by 50%    Baseline 70% better    Status Achieved                   Plan - 06/17/20 1445    Clinical Impression Statement progressing with goals. cardio up to 10 min per report in clinic did 20 min with only rest as we moved machines, unable to tolerate standing full 6 min in UBE. full Pass RT shld ROM with minimal tightness in tight.    PT Treatment/Interventions ADLs/Self Care Home Management;Electrical Stimulation;Moist Heat;Iontophoresis 82m/ml Dexamethasone;Therapeutic activities;Therapeutic exercise;Neuromuscular re-education;Manual techniques;Patient/family education;Passive range of motion;Dry needling;Taping    PT Next Visit Plan UE stretching/flexibility, scap stab/postural ex's, diaphragmatic breathing, work on increasing endurance           Patient will benefit from skilled therapeutic intervention in order to improve the following deficits and impairments:  Decreased endurance, Increased muscle spasms, Impaired UE functional use, Decreased activity tolerance, Pain, Impaired flexibility, Postural dysfunction  Visit Diagnosis: Pain in thoracic spine  Rib pain on right side     Problem List Patient Active Problem List   Diagnosis Date Noted   Hypertension 04/20/2020   Tachycardia 04/20/2020   Psoriasis 04/20/2020   Palpitations 12/05/2019   Slurred speech 12/05/2019   Weakness on left side of face 12/05/2019   Costochondritis 12/05/2019   Skin rash 09/02/2019   Abdominal pain, chronic, right lower quadrant    Seizure disorder ([facility]    [place] ([place] 07/[place]   Herpes 09/19/2017   Diverticulosis 02/15/2017   Internal hemorrhoids 02/15/2017   Hematochezia 11/16/2016   History of diabetes mellitus, type II 08/17/2016   GERD (gastroesophageal reflux disease) 08/17/2016   Anemia 08/17/2016   Chronic pelvic pain in female 06/24/2016    Zakkiyya Barno,ANGIE PTA 06/17/2020, 2:48 PM  [place] [address]Phone:  [phone]  Fax:  [phone] Name: RChameka McmullenMRN: [id number] of Birth: [birth year]

## 2020-06-24 ENCOUNTER — Other Ambulatory Visit: Payer: Self-pay

## 2020-06-24 ENCOUNTER — Ambulatory Visit: Payer: Medicaid Other | Attending: Internal Medicine | Admitting: Physical Therapy

## 2020-06-24 DIAGNOSIS — R0781 Pleurodynia: Secondary | ICD-10-CM

## 2020-06-24 NOTE — Therapy (Signed)
[address]. [place], [place], [address] Phone: [phone]   Fax:  [phone]  Physical Therapy Treatment  Patient Details  Name: Stacy Jackson Jackson: [id number] Date of Birth: 05-27-1981 Referring Provider (PT): [place]   Encounter Date: 06/24/2020   PT End of Session - 06/24/20 1441    Visit Number 8    Date for PT Re-Evaluation 07/07/20    PT Start Time 1400    PT Stop Time 1440    PT Time Calculation (min) 40 min           Past Medical History:  Diagnosis Date  . Abdominal pain, chronic, right lower quadrant   . Cervical cancer ([place]) 2015   Unsuccessful LEEP, then TAH  . 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 Complicated migraine 7209   Left arm numbness, states she may have had a "mini-stroke"  . Diabetes mellitus without complication ([place])   . Diverticulosis 02/15/2017  . GERD (gastroesophageal reflux disease)    with hiatal hernia.  fundoplication in 4709  . History of obesity 2010  . Ovarian cyst right   2008  . 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 Seizures ([place])   . Sickle cell anemia (HCC)   . Stroke [facility])     Past Surgical History:  Procedure Laterality Date  . CESAREAN SECTION  2003, 2008   2nd with left oophorectomy  . COLONOSCOPY WITH PROPOFOL N/A 11/22/2016   Procedure: COLONOSCOPY WITH PROPOFOL;  Surgeon: Arta Silence, MD;  [facility]: [facility];  Service: Endoscopy;  Laterality: N/A;  . ESOPHAGOGASTRODUODENOSCOPY  01/13/2015   Normal biopsy of gastric mucosa/gastric pouch  . NISSEN FUNDOPLICATION  6283  . OOPHORECTOMY Left 2008   Done at same time of Csection  . ROUX-EN-Y GASTRIC BYPASS  ?with Nissen fundoplication   Unknown year--[facility].  Marland Kitchen VAGINAL HYSTERECTOMY  2015   For cervical  cancer unresponsive to LEEP    There were no vitals filed for this visit.   Subjective Assessment - 06/24/20 1406    Subjective chest hurting today    Pain Score 7     Pain Location Chest    Pain Orientation Right                             [facility] Adult PT Treatment/Exercise - 06/24/20 0001      Neck Exercises: Machines for Strengthening   UBE (Upper Arm Bike) L3 3 min fwd/ 3 min backward   STANDING    Nustep L 6 8 min      Neck Exercises: Theraband   Scapula Retraction 20 reps   cable pulleys 10 # each   Shoulder Extension Limitations 5# each side2x10   cable pulleys   Other Theraband Exercises cable pulley flex and chest press 2 sets 10 5# each side      Neck Exercises: Standing   Other Standing Exercises 3# flex into abd and down 20x    Other Standing Exercises 3# abd into flex then down 10x      Manual Therapy   Manual Therapy Soft tissue mobilization;Joint mobilization;Passive ROM    Soft tissue mobilization STM to Rt chest wall/pecs   tightness but no TP noted   Passive ROM RT shld - full PROM  PT Short Term Goals - 05/13/20 1350      PT SHORT TERM GOAL #1   Title Pt will be independent with HEP    Status Achieved             PT Long Term Goals - 06/24/20 1408      PT LONG TERM GOAL #1   Title Pt will report ability to walk >30 min with no increase SOB    Baseline pt stated 10-15 min    Status Partially Met      PT LONG TERM GOAL #2   Title Pt will report resolution of tenderness in R upper ribs    Status Partially Met      PT LONG TERM GOAL #3   Title Pt will report ability to reach overhead for ADLs with no increase in R rib/chest pain    Baseline pt verb at times she can    Status Partially Met                 Plan - 06/24/20 1442    Clinical Impression Statement pt tolerated 14 min of cardio without rest today and able to stand whole  time with UBE. pt did fatigue and showed muscle weakness  with ther ex. Full/painfree PROM to RT shld . Tightness in RT chest wall but no TP noted.    PT Treatment/Interventions ADLs/Self Care Home Management;Electrical Stimulation;Moist Heat;Iontophoresis 5m/ml Dexamethasone;Therapeutic activities;Therapeutic exercise;Neuromuscular re-education;Manual techniques;Patient/family education;Passive range of motion;Dry needling;Taping    PT Next Visit Plan UE strength           Patient will benefit from skilled therapeutic intervention in order to improve the following deficits and impairments:  Decreased endurance, Increased muscle spasms, Impaired UE functional use, Decreased activity tolerance, Pain, Impaired flexibility, Postural dysfunction  Visit Diagnosis: Rib pain on right side     Problem List Patient Active Problem List   Diagnosis Date Noted  . Hypertension 04/20/2020  . Tachycardia 04/20/2020  . Psoriasis 04/20/2020  . Palpitations 12/05/2019  . Slurred speech 12/05/2019  . Weakness on left side of face 12/05/2019  . Costochondritis 12/05/2019  . Skin rash 09/02/2019  . Abdominal pain, chronic, right lower quadrant   . [place] ([place]   . Seizure ([place] 07/11/2018  . Herpes 09/19/2017  . Diverticulosis 02/15/2017  . Internal hemorrhoids 02/15/2017  . Hematochezia 11/16/2016  . History of diabetes mellitus, type II 08/17/2016  . GERD (gastroesophageal reflux disease) 08/17/2016  . Anemia 08/17/2016  . Chronic pelvic pain in female 06/24/2016    Stacy Jackson,Stacy Jackson PTA 06/24/2020, 2:45 PM  [place] [address]Phone: [phone]  Fax:  [phone] Name: Stacy MattieMRN: [id number] of Birth: [birth year]

## 2020-07-01 ENCOUNTER — Other Ambulatory Visit: Payer: Self-pay

## 2020-07-01 ENCOUNTER — Ambulatory Visit: Payer: Medicaid Other | Admitting: Physical Therapy

## 2020-07-01 DIAGNOSIS — R0781 Pleurodynia: Secondary | ICD-10-CM | POA: Diagnosis not present

## 2020-07-01 NOTE — Therapy (Signed)
[address]. [place], [place], [address] Phone: [phone]   Fax:  [phone]  Physical Therapy Treatment  Patient Details  Name: Stacy Jackson Date MRN: [id number] Date of Birth: 10-26-1980 Referring Provider (PT): [place]   Encounter Date: 07/01/2020   PT End of Session - 07/01/20 1340    Visit Number 9    Date for PT Re-Evaluation 07/07/20    PT Start Time 1320    PT Stop Time 1400    PT Time Calculation (min) 40 min           Past Medical History:  Diagnosis Date  . Abdominal pain, chronic, right lower quadrant   . Cervical cancer ([place]) 2015   Unsuccessful LEEP, then TAH  . 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 Complicated migraine 6962   Left arm numbness, states she may have had a "mini-stroke"  . Diabetes mellitus without complication ([place])   . Diverticulosis 02/15/2017  . GERD (gastroesophageal reflux disease)    with hiatal hernia.  fundoplication in 9528  . History of obesity 2010  . Ovarian cyst right   2008  . 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 Seizures ([place])   . Sickle cell anemia (HCC)   . Stroke [facility])     Past Surgical History:  Procedure Laterality Date  . CESAREAN SECTION  2003, 2008   2nd with left oophorectomy  . COLONOSCOPY WITH PROPOFOL N/A 11/22/2016   Procedure: COLONOSCOPY WITH PROPOFOL;  Surgeon: Arta Silence, MD;  [facility];  Service: Endoscopy;  Laterality: N/A;  . ESOPHAGOGASTRODUODENOSCOPY  01/13/2015   Normal biopsy of gastric mucosa/gastric pouch  . NISSEN FUNDOPLICATION  4132  . OOPHORECTOMY Left 2008   Done at same time of Csection  . ROUX-EN-Y GASTRIC BYPASS  ?with Nissen fundoplication   Unknown year--found in GI records from [facility] as bariatric procedure.  Marland Kitchen VAGINAL HYSTERECTOMY  2015   For cervical  cancer unresponsive to LEEP    There were no vitals filed for this visit.   Subjective Assessment - 07/01/20 1322    Subjective having a few good days after therapy and tehn pain comes pain, yesterday was 8/10    Currently in Pain? Yes    Pain Location Chest    Pain Orientation Right                             [facility] Adult PT Treatment/Exercise - 07/01/20 0001      Ambulation/Gait   Gait Comments 6 min outisde all surfaces 6 min      Neck Exercises: Machines for Strengthening   UBE (Upper Arm Bike) L4 3 min fwd/ 3 min backward   standing   Nustep L 6 8 min    Other Machines for Strengthening elliptical x4 min fwd/2 min bkwd      Shoulder Exercises: Standing   Other Standing Exercises wt ball trampiline throw flex and abd 10 each   blue ball 2 hand toss   Other Standing Exercises incline push ups 2 set s10   red tabnd 3 way rhy stab 15 each     Manual Therapy   Manual Therapy Soft tissue mobilization;Joint mobilization;Passive ROM    Passive ROM RT shld - full PROM  PT Short Term Goals - 05/13/20 1350      PT SHORT TERM GOAL #1   Title Pt will be independent with HEP    Status Achieved             PT Long Term Goals - 07/01/20 1323      PT LONG TERM GOAL #1   Title Pt will report ability to walk >30 min with no increase SOB    Baseline pt stated 10-15 min    Status Partially Met      PT LONG TERM GOAL #2   Title Pt will report resolution of tenderness in R upper ribs    Status Partially Met      PT LONG TERM GOAL #3   Title Pt will report ability to reach overhead for ADLs with no increase in R rib/chest pain    Baseline pt verb at times she can- mostly using LEft    Status Partially Met      PT LONG TERM GOAL #4   Title Pt will report reduction in pain by 50%    Baseline 70% better    Status Achieved                 Plan - 07/01/20 1341    Clinical Impression Statement goals partially met. pt very  pain 70% better. func and stamina is limited. pt reports 10-15 on walking but today demo 26 min of cardio without rest. Full painfree PROM rt shld.    PT Treatment/Interventions ADLs/Self Care Home Management;Electrical Stimulation;Moist Heat;Iontophoresis 1m/ml Dexamethasone;Therapeutic activities;Therapeutic exercise;Neuromuscular re-education;Manual techniques;Patient/family education;Passive range of motion;Dry needling;Taping    PT Next Visit Plan D/C           Patient will benefit from skilled therapeutic intervention in order to improve the following deficits and impairments:  Decreased endurance, Increased muscle spasms, Impaired UE functional use, Decreased activity tolerance, Pain, Impaired flexibility, Postural dysfunction  Visit Diagnosis: Rib pain on right side     Problem List Patient Active Problem List   Diagnosis Date Noted  . Hypertension 04/20/2020  . Tachycardia 04/20/2020  . Psoriasis 04/20/2020  . Palpitations 12/05/2019  . Slurred speech 12/05/2019  . Weakness on left side of face 12/05/2019  . Costochondritis 12/05/2019  . Skin rash 09/02/2019  . Abdominal pain, chronic, right lower quadrant   . Seizure disorder ([place]   . Seizure ([facility] 07/11/2018  . Herpes 09/19/2017  . Diverticulosis 02/15/2017  . Internal hemorrhoids 02/15/2017  . Hematochezia 11/16/2016  . History of diabetes mellitus, type II 08/17/2016  . GERD (gastroesophageal reflux disease) 08/17/2016  . Anemia 08/17/2016  . Chronic pelvic pain in female 06/24/2016  PHYSICAL THERAPY DISCHARGE SUMMARY   Plan: Patient agrees to discharge.  Patient goals were partially met. Patient is being discharged due to being pleased with the current functional level.  ?????     AAmador Cunas PT, DPT Theoplis Garciagarcia,ANGIE  PTA 07/01/2020, 1:54 PM  [place] [address]Phone: [phone]  Fax:  [phone] Name: Stacy SilveriMRN: [id number] of Birth: [birth year]

## 2020-08-29 ENCOUNTER — Other Ambulatory Visit: Payer: Self-pay

## 2020-08-29 ENCOUNTER — Encounter: Payer: Self-pay | Admitting: Internal Medicine

## 2020-08-29 ENCOUNTER — Ambulatory Visit: Payer: Medicaid Other | Admitting: Internal Medicine

## 2020-08-29 ENCOUNTER — Ambulatory Visit (INDEPENDENT_AMBULATORY_CARE_PROVIDER_SITE_OTHER): Payer: Medicaid Other | Admitting: Internal Medicine

## 2020-08-29 VITALS — BP 105/88 | HR 88 | Resp 12 | Ht 60.0 in | Wt 130.5 lb

## 2020-08-29 DIAGNOSIS — D509 Iron deficiency anemia, unspecified: Secondary | ICD-10-CM | POA: Diagnosis not present

## 2020-08-29 DIAGNOSIS — R569 Unspecified convulsions: Secondary | ICD-10-CM | POA: Diagnosis not present

## 2020-08-29 DIAGNOSIS — F419 Anxiety disorder, unspecified: Secondary | ICD-10-CM

## 2020-08-29 DIAGNOSIS — L409 Psoriasis, unspecified: Secondary | ICD-10-CM | POA: Diagnosis not present

## 2020-08-29 DIAGNOSIS — Z23 Encounter for immunization: Secondary | ICD-10-CM | POA: Diagnosis not present

## 2020-08-29 DIAGNOSIS — I10 Essential (primary) hypertension: Secondary | ICD-10-CM

## 2020-08-29 DIAGNOSIS — R002 Palpitations: Secondary | ICD-10-CM | POA: Diagnosis not present

## 2020-08-29 DIAGNOSIS — R4781 Slurred speech: Secondary | ICD-10-CM

## 2020-08-29 DIAGNOSIS — F32A Depression, unspecified: Secondary | ICD-10-CM

## 2020-08-29 DIAGNOSIS — R29810 Facial weakness: Secondary | ICD-10-CM

## 2020-08-29 MED ORDER — IRON POLYSACCH CMPLX-B12-FA 150-0.025-1 MG PO CAPS: ORAL_CAPSULE | ORAL | 11 refills | Status: AC

## 2020-08-29 MED ORDER — BUSPIRONE HCL 5 MG PO TABS: 5.0000 mg | ORAL_TABLET | Freq: Three times a day (TID) | ORAL | 4 refills | Status: DC

## 2020-08-29 NOTE — Patient Instructions (Signed)
Call progress report to Dr. Amil Amen end of next week.

## 2020-08-29 NOTE — Progress Notes (Signed)
Subjective:    Patient ID: Stacy Jackson, female   DOB: 1981/09/15, 39 y.o.   MRN: [id number]   HPI   Refused to have light on as feels it sets of her seizures Follow up mainly for palpitations on Metoprolol.  1.  Palpitations:  States she continues to have the palpitations daily.  Occur 4-5 times daily.  Last about 2 minutes.  Does feel a bit light headed at times with palpitation, but never has lost consciousness.    Thyroid testing was normal in August. ECG was normal from same complaint in February. Catches herself taking sort of staccato sigh breaths--describes like one would have when sobbing and taking a breath in.  Not clear why she brings this up with palpitations as she denies palpitations with this breathing.  Breaths like this 8-10 times daily.    2.  Seizures vs Pseudo-seizures:  Still has not heard from Neurology.  Referral sent in August.  See previous notes for history. Copying history from note 04/29/20 for referral made today to Dr. Joesph July at [facility].   04/29/20:  Neurologic signs and symptoms:  Patient first brought concerns about neurologic symptoms/possible seizures to our attention at her visit April 11, 2017.  Question of seizure vs pseudoseizure activity from two separate hospitals/EDs when visiting in [place].  Ultimately, underwent evaluation here in [place] from fall of 2018 through Feb of 2019 with Neurology, Dr. Leta Baptist.  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  He recommended she wean off anti seizure meds, but apparently went back to Neurologist in [place] and was started/maintained on anti-seizure meds.  After review of records from Neurology in [place], not clear they were aware of work up here and could not find new evaluation with that office--just review of records from the two [place].  See  Assessment and plan from note May 10th  2019.  Able to speak with provider/nursing at the [facility] Neurology office about her previous evaluations, her new symptoms in February of 2021 of facial droop and numbness, and slurred speech, reportedly observed by her son, but no medical  medical attention sought by patient.  The [place] office was planning to call her in for a visit.   She, however, has not been seen there as her insurance has changed.  She does not want to return to Dr. Leta Baptist.  Would prefer [facility] Has not had any further symptoms of speech slurring and facial drooping.    3.  Psoriasis:  Feels this is doing better in general.  No new outbreaks recently.  [facility] app[facility] with [facility] weeks.  Her MTX was decreased to 7.5 mg once weekly instead of twice weekly due to low WBC count.  Hemoglobin remained stable, though still a bit low.  4.  Anemia, iron deficient.  Continues on Ferrous gluconate 324 mg once daily.  Has GI upset with this.  5.  Hypertension:  Improved on Metoprolol.    6.  Anxiety:  Having difficulties with all her health issues.  Feels very overwhelmed.  Denies suicidal thoughts, but depressed at times.  Stopped Cymbalta about 6 months ago as she did not notice a difference when taking.  Started back about 2 months ago and has noted mild improvement.   Has had side effects with other medications for depression and anxiety in the past  Has not taken buspar.    Current Meds  Medication Sig  . clobetasol ointment (TEMOVATE) 0.05 % Apply to affected areas twice daily as needed  . cyanocobalamin 100 MCG tablet Take 1,000 mcg by mouth daily.   Marland Kitchen dicyclomine (BENTYL) 10 MG capsule 1 cap by mouth every 8 hours as needed  . DULoxetine (CYMBALTA) 60 MG capsule TAKE 1 CAPSULE BY MOUTH ONCE DAILY  . famotidine (PEPCID) 20 MG tablet 3 tablets by mouth 1 hour before breakfast  . ferrous gluconate (FERGON) 324 MG tablet 1 tab by mouth with Vitamin C 500 mg twice daily   . fexofenadine (ALLEGRA) 180 MG tablet 1 tab by mouth daily as needed  . folic acid (FOLVITE) 1 MG tablet Take by mouth.  . gabapentin (NEURONTIN) 400 MG capsule TAKE 1 CAPSULE BY MOUTH THREE TIMES DAILY  . levETIRAcetam (KEPPRA) 1000 MG tablet Take 1,000 mg by mouth 2 (two) times daily.  . meloxicam (MOBIC) 15 MG tablet TAKE 1 TABLET BY MOUTH DAILY  . methotrexate (RHEUMATREX) 2.5 MG tablet Take 15 mg (6 tabs) weekly. Can take 3 tabs on Sunday and 3 tabs on Monday.  . metoprolol succinate (TOPROL-XL) 25 MG 24 hr tablet Take 2 tablets (50 mg total) by mouth daily. Take with or immediately following a meal.  . naproxen sodium (ALEVE) 220 MG tablet Take 220 mg by mouth daily as needed (pain).  Marland Kitchen tiZANidine (ZANAFLEX) 4 MG tablet TAKE 1/2 TO 1 (ONE-HALF TO ONE) TABLET BY MOUTH NIGHTLY AS NEEDED MAY  REPEAT  1  TIME   Allergies  Allergen Reactions  . Shrimp [Shellfish Allergy] Anaphylaxis     Review of Systems    Objective:   BP 105/88 (BP Location: Right Arm, Patient Position: Sitting, Cuff Size: Normal)   Pulse (!) 103   Resp 12   Ht 5' (1.524 m)   Wt 130 lb 8 oz (59.2 kg)   LMP 12/14/2012   BMI 25.49 kg/m   Physical Exam  NAD Skin:  Clear HEENT:  PERRL, EOMI, TMs, pearly gray. Neck:  Supple, No adenopathy, not thyromegaly Chest:  CTA CV:  RRR with one premature beat with pause--unable to capture again with auscultation Abd:  S, NT, No HSM or mass, + BS LE:  No edema. Neuro:  No change--normal  Assessment & Plan  1.  Palpitations:  ECG in February when first mentioned this was normal.  As when she is here, have been unable to capture, will send to Cardiology for question of whether needs event monitor to catch as she feels this is symptomatic. Not clear how much her anxiety and depression plays in this.   2.  Neurologic symptoms:  Referral to Dr Joesph July at [facility].    3.  Depression/anxiety:  Encouraged her to call before stopping meds in future.  She describes a gradual  wean, rather than sudden stop of Cymbalta, however. To take buspar twice daily to start and then increase to 3 times daily. Will see if this helps with palpitations as well. Refused counseling with [facility]. Follow up in 1 month Progress report in 1 week.  4.  Hypertension:  Controlled  5.  Iron deficiency anemia:  GI upset with ferrous gluconate.  She is taking folic acid daily for use of MTX with psoriasis.  Switch to combination of Niferex 818 mg H63 and Folic Acid 1 mg daily to see if tolerates better as well as cost savings.  6.  Psoriasis:  Appears well controlled with  MTX.  Reportedly being evaluated for psoriatic arthritis as having joint complaints as well.  Not clear she has any inflammatory changes with joint discomfort.  7.  HM:  Influenza vaccine today.  Encourage coming in soon for [place] booster.  We have a clinic every Monday.

## 2020-09-02 DIAGNOSIS — F446 Conversion disorder with sensory symptom or deficit: Secondary | ICD-10-CM | POA: Insufficient documentation

## 2020-09-02 DIAGNOSIS — L4050 Arthropathic psoriasis, unspecified: Secondary | ICD-10-CM | POA: Insufficient documentation

## 2020-09-02 DIAGNOSIS — H93233 Hyperacusis, bilateral: Secondary | ICD-10-CM | POA: Insufficient documentation

## 2020-09-05 ENCOUNTER — Telehealth: Payer: Self-pay | Admitting: Internal Medicine

## 2020-09-05 NOTE — Telephone Encounter (Signed)
Patient called to let Doctor Amil Amen know that medicine she prescribed is helping her to feel a bit better but continues to have chest pain. Patient states she called in request of Doctor.

## 2020-10-29 ENCOUNTER — Ambulatory Visit (INDEPENDENT_AMBULATORY_CARE_PROVIDER_SITE_OTHER): Payer: Medicaid Other | Admitting: Internal Medicine

## 2020-10-29 ENCOUNTER — Encounter: Payer: Self-pay | Admitting: Internal Medicine

## 2020-10-29 ENCOUNTER — Other Ambulatory Visit: Payer: Self-pay

## 2020-10-29 VITALS — BP 123/79 | HR 67 | Resp 12 | Ht 60.0 in | Wt 144.5 lb

## 2020-10-29 DIAGNOSIS — E041 Nontoxic single thyroid nodule: Secondary | ICD-10-CM

## 2020-10-29 DIAGNOSIS — F419 Anxiety disorder, unspecified: Secondary | ICD-10-CM

## 2020-10-29 DIAGNOSIS — D509 Iron deficiency anemia, unspecified: Secondary | ICD-10-CM

## 2020-10-29 DIAGNOSIS — Z1159 Encounter for screening for other viral diseases: Secondary | ICD-10-CM

## 2020-10-29 DIAGNOSIS — R002 Palpitations: Secondary | ICD-10-CM | POA: Diagnosis not present

## 2020-10-29 DIAGNOSIS — R5383 Other fatigue: Secondary | ICD-10-CM

## 2020-10-29 DIAGNOSIS — F32A Depression, unspecified: Secondary | ICD-10-CM

## 2020-10-29 DIAGNOSIS — E7800 Pure hypercholesterolemia, unspecified: Secondary | ICD-10-CM

## 2020-10-29 MED ORDER — LEVETIRACETAM 1000 MG PO TABS
1000.0000 mg | ORAL_TABLET | Freq: Two times a day (BID) | ORAL | 0 refills | Status: DC
Start: 2020-10-29 — End: 2020-12-31

## 2020-10-29 MED ORDER — VENLAFAXINE HCL ER 75 MG PO CP24: 75.0000 mg | ORAL_CAPSULE | Freq: Every day | ORAL | 2 refills | Status: DC

## 2020-10-29 MED ORDER — EPINEPHRINE 0.3 MG/0.3ML IJ SOAJ: 0.3000 mg | INTRAMUSCULAR | 1 refills | Status: AC | PRN

## 2020-10-29 NOTE — Progress Notes (Signed)
0

## 2020-10-29 NOTE — Progress Notes (Signed)
Subjective:    Patient ID: Stacy Jackson, female   DOB: 02/02/1981, 40 y.o.   MRN: [id number]   HPI   1.  Spells:  Appears from Dr. Matilde Bash, Neurology at [facility], notes patient not felt to be suffering with seizures, but spells could be related to vasculitis with her hx of psoriatic vasculitis.  Sleep deprived EEG was normal 09/11/20 and MR of brain did not show focus for seizure on 09/08/20, though mildly age advanced diffuse parenchymal volume loss.  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   Patient asks about Keppra being refilled, but in notes, appears they were looking more toward stopping the [place].   She does have refills through April, but states pharmacy is stating needs to be written by an [place] physician for the medication to be covered by her insurance.  Patient relates Dr. Joesph July suggested she was possibly having "mini-strokes".  I am unable to find that stated in her documentation.  2.  General Malaise:  Refers for testing positive for COVID in August of 2020--states at [facility] mobile, but no documentation for this in chart.  Negative with [facility] 02/2019 and negative with [facility] minute [facility] 04/2019.   Documented in chart there were attempts made to notify her of negative testing in June. Talks about intermittent nausea Bumps in her mouth.  Feels like tongue is burned and is numb, but hasn't burned it.  Fatigue.   Feels like she gets confused easily/blanks out.  Feels things just aren't "clicking"   States she felt like she never recovered from what she states was COVID.   Feels she is getting worse. Does have history of iron deficiency anemia--was slightly improved in August of 2021.  3.  Anxiety/depression:  Had stopped the Cymbalta on her own for a time, likely in June 2021 and then back on in October. Does feel the Cymbalta and also the Buspar have helped, but not enough.   Has taken Zoloft with side effects in past. Lexapro with some  improvement in past, but ultimately, did not get adequate improvement. Has never taken Effexor, Citalopram,  Has been to 3 therapists in past and does not feel she would benefit.  States she has been told she is doing everything they would recommend for coping, etc  4.  Palpitations:  Did not realize did not get set up with Cardiology as sent to wrong cardiologist back in December.  Will resend.  Still daily and minimal response to Metoprolol.  See record from 08/29/2020.  ECG normal same date.  5.  Needs refill of epipen for shellfish allergy.  Current Meds  Medication Sig  . busPIRone (BUSPAR) 5 MG tablet Take 1 tablet (5 mg total) by mouth 3 (three) times daily.  . clobetasol ointment (TEMOVATE) 0.05 % Apply to affected areas twice daily as needed  . dicyclomine (BENTYL) 10 MG capsule 1 cap by mouth every 8 hours as needed  . DULoxetine (CYMBALTA) 60 MG capsule TAKE 1 CAPSULE BY MOUTH ONCE DAILY  . famotidine (PEPCID) 20 MG tablet 3 tablets by mouth 1 hour before breakfast  . fexofenadine (ALLEGRA) 180 MG tablet 1 tab by mouth daily as needed  . gabapentin (NEURONTIN) 400 MG capsule TAKE 1 CAPSULE BY MOUTH THREE TIMES DAILY  . Iron Polysacch Cmplx-B12-FA 150-0.025-1 MG CAPS 1 cap by mouth daily  . levETIRAcetam (KEPPRA) 1000 MG tablet Take 1,000 mg by mouth 2 (two) times daily.  . methotrexate (RHEUMATREX) 2.5 MG tablet  Take 15 mg (6 tabs) weekly. Can take 3 tabs on Sunday and 3 tabs on Monday.  . metoprolol succinate (TOPROL-XL) 25 MG 24 hr tablet Take 2 tablets (50 mg total) by mouth daily. Take with or immediately following a meal.  . naproxen sodium (ALEVE) 220 MG tablet Take 220 mg by mouth daily as needed (pain).  Marland Kitchen tiZANidine (ZANAFLEX) 4 MG tablet TAKE 1/2 TO 1 (ONE-HALF TO ONE) TABLET BY MOUTH NIGHTLY AS NEEDED MAY  REPEAT  1  TIME  Cosentyx 300 mg once monthly--added to med list.   Allergies  Allergen Reactions  . Shrimp [Shellfish Allergy] Anaphylaxis     Review of  Systems    Objective:   BP 123/79 (BP Location: Right Arm, Patient Position: Sitting, Cuff Size: Normal)   Pulse 67   Resp 12   Ht 5' (1.524 m)   Wt 144 lb 8 oz (65.5 kg)   LMP 12/14/2012   BMI 28.22 kg/m   Physical Exam  NAD HEENT:  PERRL, EOMI, conjunctivae with good coloring--not significantly pale Throat without injection.  Tongue without lesion.  TMs pearly gray Neck:  Supple, No adenopathy.  Enlarged nodular feeling thyroid.  No lid lag, possibly some treumulousness. Chest:  CTA CV:  A bit tachycardic on my exam--around 100.RRR without murmur or rub.  Radial and DP pulses normal and equal Abd:  S NT, NO HSM or mass + BS LE:  No edema.  Skin normal Assessment & Plan  1.  Spells:  Need to check with Dr. Joesph July regarding follow up--encouraged patient to do the same.  Not clear if she recommended definitively coming off the Levatiracetam.  2.  Fatigue/general malaise/nodular thyroid/hx of anemia:  Not clear she actually suffered with COVID based on her history.  The date given for testing she was actually negative at a pharmacy testing site. CBC, CMP, TSH, Free T4, Hep C antibody.  Could be related to complaint of palpitations as well.  3.  Palpitations:  Send to Cardiology   4.  Depression/anxiety:  Start Venlafaxine XR 75 mg daily.  Encouraged working with our LCSW, [employer].  She is currently not interested.  5.  Allergy to shellfish:  Epipen ordered.

## 2020-10-30 ENCOUNTER — Telehealth: Payer: Self-pay

## 2020-10-30 ENCOUNTER — Other Ambulatory Visit: Payer: Self-pay | Admitting: Clinical

## 2020-10-30 LAB — CBC WITH DIFFERENTIAL/PLATELET
Basophils Absolute: 0 10*3/uL (ref 0.0–0.2)
Basos: 1 %
EOS (ABSOLUTE): 0.1 10*3/uL (ref 0.0–0.4)
Eos: 1 %
Hematocrit: 30.2 % — ABNORMAL LOW (ref 34.0–46.6)
Hemoglobin: 9.3 g/dL — ABNORMAL LOW (ref 11.1–15.9)
Immature Grans (Abs): 0 10*3/uL (ref 0.0–0.1)
Immature Granulocytes: 0 %
Lymphocytes Absolute: 1.5 10*3/uL (ref 0.7–3.1)
Lymphs: 31 %
MCH: 24.2 pg — ABNORMAL LOW (ref 26.6–33.0)
MCHC: 30.8 g/dL — ABNORMAL LOW (ref 31.5–35.7)
MCV: 79 fL (ref 79–97)
Monocytes Absolute: 0.5 10*3/uL (ref 0.1–0.9)
Monocytes: 9 %
Neutrophils Absolute: 2.9 10*3/uL (ref 1.4–7.0)
Neutrophils: 58 %
Platelets: 252 10*3/uL (ref 150–450)
RBC: 3.84 x10E6/uL (ref 3.77–5.28)
RDW: 18.8 % — ABNORMAL HIGH (ref 11.7–15.4)
WBC: 5 10*3/uL (ref 3.4–10.8)

## 2020-10-30 LAB — COMPREHENSIVE METABOLIC PANEL
ALT: 13 IU/L (ref 0–32)
AST: 21 IU/L (ref 0–40)
Albumin/Globulin Ratio: 1.6 (ref 1.2–2.2)
Albumin: 3.8 g/dL (ref 3.8–4.8)
Alkaline Phosphatase: 50 IU/L (ref 44–121)
BUN/Creatinine Ratio: 15 (ref 9–23)
BUN: 10 mg/dL (ref 6–20)
Bilirubin Total: 0.3 mg/dL (ref 0.0–1.2)
CO2: 22 mmol/L (ref 20–29)
Calcium: 8.5 mg/dL — ABNORMAL LOW (ref 8.7–10.2)
Chloride: 103 mmol/L (ref 96–106)
Creatinine, Ser: 0.65 mg/dL (ref 0.57–1.00)
GFR calc Af Amer: 129 mL/min/{1.73_m2} (ref >59)
GFR calc non Af Amer: 112 mL/min/{1.73_m2} (ref >59)
Globulin, Total: 2.4 g/dL (ref 1.5–4.5)
Glucose: 75 mg/dL (ref 65–99)
Potassium: 4.4 mmol/L (ref 3.5–5.2)
Sodium: 137 mmol/L (ref 134–144)
Total Protein: 6.2 g/dL (ref 6.0–8.5)

## 2020-10-30 LAB — LIPID PANEL W/O CHOL/HDL RATIO
Cholesterol, Total: 245 mg/dL — ABNORMAL HIGH (ref 100–199)
HDL: 113 mg/dL (ref >39)
LDL Chol Calc (NIH): 123 mg/dL — ABNORMAL HIGH (ref 0–99)
Triglycerides: 56 mg/dL (ref 0–149)
VLDL Cholesterol Cal: 9 mg/dL (ref 5–40)

## 2020-10-30 LAB — TSH: TSH: 0.538 u[IU]/mL (ref 0.450–4.500)

## 2020-10-30 LAB — HEPATITIS C ANTIBODY: Hep C Virus Ab: <0.1 s/co ratio (ref 0.0–0.9)

## 2020-10-30 LAB — T4, FREE: Free T4: 0.57 ng/dL — ABNORMAL LOW (ref 0.82–1.77)

## 2020-10-30 NOTE — Telephone Encounter (Cosign Needed)
MSW Intern Lauree Chandler called the patient to see whether she was still coming to their session on this date. The patient did not answer the phone, so the intern left her a voicemail and suggested that they reschedule.

## 2020-10-31 ENCOUNTER — Ambulatory Visit: Payer: Medicaid Other | Admitting: Cardiology

## 2020-10-31 NOTE — Telephone Encounter (Signed)
Pt . Has been seen at office- closing this from my calls

## 2020-11-13 ENCOUNTER — Telehealth (INDEPENDENT_AMBULATORY_CARE_PROVIDER_SITE_OTHER): Payer: Medicaid Other | Admitting: Internal Medicine

## 2020-11-13 DIAGNOSIS — F419 Anxiety disorder, unspecified: Secondary | ICD-10-CM | POA: Diagnosis not present

## 2020-11-13 DIAGNOSIS — F32A Depression, unspecified: Secondary | ICD-10-CM

## 2020-11-13 NOTE — Progress Notes (Signed)
    Subjective:    Patient ID: Stacy Jackson, female   DOB: [birth year], 40 y.o.   MRN: [id number]   HPI   Switched from Cymbalta to Venlafaxine SR 1 cap before breakfast.  Feels she has had a bit of difference with the change.  Anxiety is improved.  Better GI tolerance as well.  She is now about 2 weeks out from start.   No thoughts of suicide or increasing depression.  Is planning to work with Lauree Chandler, MSW intern with counseling.  Missed her first appt.    Missed appt Cardiology appt on the 11th --problems with daughter getting to school that day and missed.  She has reappointed.  Current Meds  Medication Sig  . busPIRone (BUSPAR) 5 MG tablet Take 1 tablet (5 mg total) by mouth 3 (three) times daily.  . clobetasol ointment (TEMOVATE) 0.05 % Apply to affected areas twice daily as needed  . COSENTYX SENSOREADY, 300 MG, 150 MG/ML SOAJ Inject into the skin.  . cyanocobalamin 100 MCG tablet Take 1,000 mcg by mouth daily.  Marland Kitchen dicyclomine (BENTYL) 10 MG capsule 1 cap by mouth every 8 hours as needed  . EPINEPHrine (EPIPEN 2-PAK) 0.3 mg/0.3 mL IJ SOAJ injection Inject 0.3 mg into the muscle as needed for anaphylaxis.  . famotidine (PEPCID) 20 MG tablet 3 tablets by mouth 1 hour before breakfast  . fexofenadine (ALLEGRA) 180 MG tablet 1 tab by mouth daily as needed  . gabapentin (NEURONTIN) 400 MG capsule TAKE 1 CAPSULE BY MOUTH THREE TIMES DAILY  . Iron Polysacch Cmplx-B12-FA 150-0.025-1 MG CAPS 1 cap by mouth daily  . levETIRAcetam (KEPPRA) 1000 MG tablet Take 1 tablet (1,000 mg total) by mouth 2 (two) times daily.  . methotrexate (RHEUMATREX) 2.5 MG tablet Take 15 mg (6 tabs) weekly. Can take 3 tabs on Sunday and 3 tabs on Monday.  . metoprolol succinate (TOPROL-XL) 25 MG 24 hr tablet Take 2 tablets (50 mg total) by mouth daily. Take with or immediately following a meal.  . naproxen sodium (ALEVE) 220 MG tablet Take 220 mg by mouth daily as needed (pain).  Marland Kitchen tiZANidine (ZANAFLEX)  4 MG tablet TAKE 1/2 TO 1 (ONE-HALF TO ONE) TABLET BY MOUTH NIGHTLY AS NEEDED MAY  REPEAT  1  TIME  . venlafaxine XR (EFFEXOR XR) 75 MG 24 hr capsule Take 1 capsule (75 mg total) by mouth daily with breakfast.   Allergies  Allergen Reactions  . Shrimp [Shellfish Allergy] Anaphylaxis     Review of Systems    Objective:   LMP 12/14/2012   Physical Exam  Looks well--in [place] and the weather makes her happy Assessment & Plan   Anxiety/depression:  Doing well so far on Venlafaxine.  To stay on 75 mg unless feels she starts to have worsening anxiety.  May increase to 150 mg then.  Call if not certain what to do. Has follow up in April.

## 2020-11-19 ENCOUNTER — Other Ambulatory Visit: Payer: Self-pay | Admitting: Clinical

## 2020-11-19 ENCOUNTER — Telehealth: Payer: Self-pay | Admitting: Clinical

## 2020-11-19 NOTE — Telephone Encounter (Cosign Needed)
Patient called MSW back and explained that she missed their appointment because she had been at her daughter's softball practice. She said she felt better and did not wish to reschedule her appointment at this time.

## 2020-11-19 NOTE — Progress Notes (Deleted)
Cardiology Office Note:    Date:  11/19/2020   ID:  Stacy Jackson, DOB 01/18/1981, MRN [id number]  PCP:  Mack Hook, [place]  Cardiologist:  No primary care provider on file. *** Advanced Practice Provider:  No care team member to display Electrophysiologist:  None   Referring MD: Mack Hook, MD    History of Present Illness:    Stacy Jackson is a 40 y.o. female with a hx of HTN, anxiety, seizure disorder, DMII, and GERD who was referred by Dr. Amil Amen for further evaluation of palpitations.  Past Medical History:  Diagnosis Date  . Abdominal pain, chronic, right lower quadrant   . Cervical cancer ([place]) 2015   Unsuccessful LEEP, then TAH  . 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 Complicated migraine 8841   Left arm numbness, states she may have had a "mini-stroke"  . Diabetes mellitus without complication ([place])   . Diverticulosis 02/15/2017  . GERD (gastroesophageal reflux disease)    with hiatal hernia.  fundoplication in 6606  . History of obesity 2010  . Ovarian cyst right   2008  . 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 Seizures ([place])   . Sickle cell anemia (H[facility])   . Stroke [facility])     Past Surgical History:  Procedure Laterality Date  . CESAREAN SECTION  2003, 2008   2nd with left oophorectomy  . COLONOSCOPY WITH PROPOFOL N/A 11/22/2016   Procedure: COLONOSCOPY WITH PROPOFOL;  Surgeon: Arta Silence, MD;  Location: [facility];  Service: Endoscopy;  Laterality: N/A;  . ESOPHAGOGASTRODUODENOSCOPY  01/13/2015   Normal biopsy of gastric mucosa/gastric pouch  . NISSEN FUNDOPLICATION  3016  . OOPHORECTOMY Left 2008   Done at same time of Csection  . ROUX-EN-Y GASTRIC BYPASS  ?with Nissen fundoplication   Unknown year--found in GI records from [facility] as bariatric  procedure.  Marland Kitchen VAGINAL HYSTERECTOMY  2015   For cervical cancer unresponsive to LEEP    Current Medications: No outpatient medications have been marked as taking for the 11/21/20 encounter (Appointment) with Freada Bergeron, MD.     Allergies:   Shrimp [shellfish allergy]   Social History   Socioeconomic History  . Marital status: Single    Spouse name: Not on file  . Number of children: 2  . Years of education: 67  . Highest education level: Not on file  Occupational History  . Occupation: Economist  Tobacco Use  . Smoking status: Never Smoker  . Smokeless tobacco: Never Used  Vaping Use  . Vaping Use: Never used  Substance and Sexual Activity  . Alcohol use: No    Alcohol/week: 0.0 standard drinks  . Drug use: No  . Sexual activity: Not Currently  Other Topics Concern  . Not on file  Social History Narrative   ** Merged History Encounter **       Lives at home with her 2 kids.   Children have different fathers--both involved in her children's lives Discusses her children needed to get set up with school when home fell through in [place], so stayed with her parents until she could get her feet u   nder her --eventually moved to [place] to be closer to them ([place])    Social Determinants of Health   Financial Resource Strain: Not on file  Food Insecurity: Not on  file  Transportation Needs: Not on file  Physical Activity: Not on file  Stress: Not on file  Social Connections: Not on file     Family History: The patient's ***family history includes Asthma in her brother and brother; Autism spectrum disorder in her son; Breast cancer in her maternal grandmother; Diabetes in her brother and father; Eczema in her brother and son; Hypertension in her brother, father, and mother; Renal cancer in her mother; Sickle cell trait in her daughter; Ulcerative colitis in her father.  ROS:   Please see the history of present illness.    *** All other systems reviewed  and are negative.  EKGs/Labs/Other Studies Reviewed:    The following studies were reviewed today: ***  EKG:  EKG is *** ordered today.  The ekg ordered today demonstrates ***  Recent Labs: 10/29/2020: ALT 13; BUN 10; Creatinine, Ser 0.65; Hemoglobin 9.3; Platelets 252; Potassium 4.4; Sodium 137; TSH 0.538  Recent Lipid Panel    Component Value Date/Time   CHOL 245 (H) 10/29/2020 1118   TRIG 56 10/29/2020 1118   HDL 113 10/29/2020 1118   LDLCALC 123 (H) 10/29/2020 1118     Risk Assessment/Calculations:   {Does this patient have ATRIAL FIBRILLATION?[phone]}   Physical Exam:    VS:  LMP 12/14/2012     Wt Readings from Last 3 Encounters:  10/29/20 144 lb 8 oz (65.5 kg)  08/29/20 130 lb 8 oz (59.2 kg)  04/29/20 134 lb (60.8 kg)     GEN: *** Well nourished, well developed in no acute distress HEENT: Normal NECK: No JVD; No carotid bruits LYMPHATICS: No lymphadenopathy CARDIAC: ***RRR, no murmurs, rubs, gallops RESPIRATORY:  Clear to auscultation without rales, wheezing or rhonchi  ABDOMEN: Soft, non-tender, non-distended MUSCULOSKELETAL:  No edema; No deformity  SKIN: Warm and dry NEUROLOGIC:  Alert and oriented x 3 PSYCHIATRIC:  Normal affect   ASSESSMENT:    No diagnosis found. PLAN:    In order of problems listed above:  #Palpitations: -2 week zio   {Are you ordering a CV Procedure (e.g. stress test, cath, DCCV, TEE, etc)?   Press F2        :[id number]}    Medication Adjustments/Labs and Tests Ordered: Current medicines are reviewed at length with the patient today.  Concerns regarding medicines are outlined above.  No orders of the defined types were placed in this encounter.  No orders of the defined types were placed in this encounter.   There are no Patient Instructions on file for this visit.   Signed, Freada Bergeron, MD  11/19/2020 3:23 PM    [place]

## 2020-11-21 ENCOUNTER — Ambulatory Visit: Payer: Medicaid Other | Admitting: Cardiology

## 2020-11-26 ENCOUNTER — Encounter: Payer: Self-pay | Admitting: General Practice

## 2020-12-15 NOTE — Progress Notes (Signed)
Cardiology Office Note   Date:  12/16/2020   ID:  Stacy Jackson, DOB 10-25-1980, MRN [id number]  PCP:  Mack Hook, MD  Cardiologist:   No primary care provider on file. Referring:  Mack Hook, MD  Chief Complaint  Patient presents with  . Palpitations      History of Present Illness: Stacy Jackson is a 40 y.o. female who is referred by Mack Hook, MD fo evaluation of palpitations.  The patient has not had any prior cardiac history.  She states she has not felt well since August 2020 when she had COVID.  She states she was short of breath and very weak with that.  Since then she feels like she cannot take a deep breath.  She gets short of breath with activities.  Her chest hurts when she is trying to do stuff.  It is a sharp discomfort.  She is not describing cough fevers or chills.  She sleeps on 3 pillows but is not describing classic PND or orthopnea.  She does not have any leg swelling.  She feels her heart racing.  This happens when she is having chest discomfort more spontaneously.  Her water alert her that her rate might be greater than 120.  It lasts for 20 to 60 seconds.  It happens at rest.  It comes and goes spontaneously.  She was started on metoprolol but she does not know and thinks that has helped.  She is not had any syncope.  She has never had this kind of discomfort before.  She does not have any prior cardiac testing.  Past Medical History:  Diagnosis Date  . Abdominal pain, chronic, right lower quadrant   . Cervical cancer ([place]) 2015   Unsuccessful LEEP, then TAH  . 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 Complicated migraine 2585   Left arm numbness, states she may have had a "mini-stroke"  . Diabetes mellitus without complication ([place])   . Diverticulosis 02/15/2017  . GERD (gastroesophageal reflux disease)    with  hiatal hernia.  fundoplication in 2778  . History of obesity 2010  . Ovarian cyst right   2008  . 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 Seizures ([place])   . Sickle cell anemia ([facility])   . Stroke [facility])     Past Surgical History:  Procedure Laterality Date  . CESAREAN SECTION  2003, 2008   2nd with left oophorectomy  . COLONOSCOPY WITH PROPOFOL N/A 11/22/2016   Procedure: COLONOSCOPY WITH PROPOFOL;  Surgeon: Arta Silence, MD;  [facility];  Service: Endoscopy;  Laterality: N/A;  . ESOPHAGOGASTRODUODENOSCOPY  01/13/2015   Normal biopsy of gastric mucosa/gastric pouch  . NISSEN FUNDOPLICATION  2423  . OOPHORECTOMY Left 2008   Done at same time of Csection  . ROUX-EN-Y GASTRIC BYPASS  ?with Nissen fundoplication   Unknown year--found [facility] from [facility] as bariatric procedure.  Marland Kitchen VAGINAL HYSTERECTOMY  2015   For cervical cancer unresponsive to LEEP     Current Outpatient Medications  Medication Sig Dispense Refill  . busPIRone (BUSPAR) 5 MG tablet Take 1 tablet (5 mg total) by mouth 3 (three) times daily. 90 tablet 4  . clobetasol ointment (TEMOVATE) 0.05 % Apply to affected areas twice daily as needed 60 g 2  . COSENTYX SENSOREADY, 300 MG, 150 MG/ML SOAJ Inject into the skin.    Marland Kitchen  cyanocobalamin 100 MCG tablet Take 1,000 mcg by mouth daily.    Marland Kitchen dicyclomine (BENTYL) 10 MG capsule 1 cap by mouth every 8 hours as needed 60 capsule 2  . EPINEPHrine (EPIPEN 2-PAK) 0.3 mg/0.3 mL IJ SOAJ injection Inject 0.3 mg into the muscle as needed for anaphylaxis. 1 each 1  . famotidine (PEPCID) 20 MG tablet 3 tablets by mouth 1 hour before breakfast 90 tablet 11  . fexofenadine (ALLEGRA) 180 MG tablet 1 tab by mouth daily as needed 30 tablet 11  . gabapentin (NEURONTIN) 400 MG capsule TAKE 1 CAPSULE BY MOUTH THREE TIMES DAILY 90 capsule 11  . Iron Polysacch Cmplx-B12-FA 150-0.025-1 MG CAPS 1 cap by mouth daily 30 capsule 11  . levETIRAcetam  (KEPPRA) 1000 MG tablet Take 1 tablet (1,000 mg total) by mouth 2 (two) times daily. 60 tablet 0  . meloxicam (MOBIC) 15 MG tablet TAKE 1 TABLET BY MOUTH DAILY 30 tablet 4  . methotrexate (RHEUMATREX) 2.5 MG tablet Take 15 mg (6 tabs) weekly. Can take 3 tabs on Sunday and 3 tabs on Monday.    . metoprolol succinate (TOPROL-XL) 25 MG 24 hr tablet Take 2 tablets (50 mg total) by mouth daily. Take with or immediately following a meal. 30 tablet 11  . naproxen sodium (ALEVE) 220 MG tablet Take 220 mg by mouth daily as needed (pain).    Marland Kitchen tiZANidine (ZANAFLEX) 4 MG tablet TAKE 1/2 TO 1 (ONE-HALF TO ONE) TABLET BY MOUTH NIGHTLY AS NEEDED MAY  REPEAT  1  TIME    . venlafaxine XR (EFFEXOR XR) 75 MG 24 hr capsule Take 1 capsule (75 mg total) by mouth daily with breakfast. 60 capsule 2   No current facility-administered medications for this visit.    Allergies:   Shrimp [shellfish allergy]    Social History:  The patient  reports that she has never smoked. She has never used smokeless tobacco. She reports that she does not drink alcohol and does not use drugs.   Family History:  The patient's family history includes Asthma in her brother and brother; Autism spectrum disorder in her son; Breast cancer in her maternal grandmother; Diabetes in her brother and father; Eczema in her brother and son; Hypertension in her brother, father, and mother; Renal cancer in her mother; Sickle cell trait in her daughter; Ulcerative colitis in her father.    ROS:  Please see the history of present illness.   Otherwise, review of systems are positive for none.   All other systems are reviewed and negative.    PHYSICAL EXAM: VS:  BP 104/76 (BP Location: Left Arm, Patient Position: Sitting)   Pulse 95   Ht 4\' 11"  (1.499 m)   Wt 135 lb (61.2 kg)   LMP 12/14/2012   SpO2 99%   BMI 27.27 kg/m  , BMI Body mass index is 27.27 kg/m. GENERAL:  Well appearing HEENT:  Pupils equal round and reactive, fundi not visualized,  oral mucosa unremarkable NECK:  No jugular venous distention, waveform within normal limits, carotid upstroke brisk and symmetric, no bruits, no thyromegaly LYMPHATICS:  No cervical, inguinal adenopathy LUNGS:  Clear to auscultation bilaterally BACK:  No CVA tenderness CHEST:  Unremarkable HEART:  PMI not displaced or sustained,S1 and S2 within normal limits, no S3, no S4, no clicks, no rubs, no murmurs ABD:  Flat, positive bowel sounds normal in frequency in pitch, no bruits, no rebound, no guarding, no midline pulsatile mass, no hepatomegaly, no splenomegaly EXT:  2  plus pulses throughout, no edema, no cyanosis no clubbing SKIN:  No rashes no nodules NEURO:  Cranial nerves II through XII grossly intact, motor grossly intact throughout PSYCH:  Cognitively intact, oriented to person place and time    EKG:  EKG is ordered today. The ekg ordered today demonstrates sinus rhythm, rate 80, axis within normal limits, intervals within normal limits, no acute ST-T wave changes.  Poor anterior R wave progression possibly lead placement but could not exclude old anteroseptal MI.   Recent Labs: 10/29/2020: ALT 13; BUN 10; Creatinine, Ser 0.65; Hemoglobin 9.3; Platelets 252; Potassium 4.4; Sodium 137; TSH 0.538    Lipid Panel    Component Value Date/Time   CHOL 245 (H) 10/29/2020 1118   TRIG 56 10/29/2020 1118   HDL 113 10/29/2020 1118   LDLCALC 123 (H) 10/29/2020 1118      Wt Readings from Last 3 Encounters:  12/16/20 135 lb (61.2 kg)  10/29/20 144 lb 8 oz (65.5 kg)  08/29/20 130 lb 8 oz (59.2 kg)      Other studies Reviewed: Additional studies/ records that were reviewed today include: Labs. Review of the above records demonstrates:  Please see elsewhere in the note.     ASSESSMENT AND PLAN:  PALPITATIONS: The patient has tachypalpitations.  She will need a 2-week event monitor.  I do see that she has normal electrolytes and thyroid.  SOB:    I will check a BNP level.  She will  also need an echocardiogram.  CHEST PAIN: I do not strongly suspect pericarditis myocarditis or obstructive coronary disease.  I will start with the testing as above and have a low threshold however for further testing such as a POET (Plain Old Exercise Treadmill)   Current medicines are reviewed at length with the patient today.  The patient does not have concerns regarding medicines.  The following changes have been made:  no change  Labs/ tests ordered today include:   Orders Placed This Encounter  Procedures  . B Nat Peptide  . CARDIAC EVENT MONITOR  . EKG 12-Lead  . ECHOCARDIOGRAM COMPLETE     Disposition:   FU with me in one month.     Signed, Minus Breeding, MD  12/16/2020 9:30 AM    [place]

## 2020-12-16 ENCOUNTER — Ambulatory Visit (INDEPENDENT_AMBULATORY_CARE_PROVIDER_SITE_OTHER): Payer: Medicaid Other | Admitting: Cardiology

## 2020-12-16 ENCOUNTER — Encounter: Payer: Self-pay | Admitting: Cardiology

## 2020-12-16 ENCOUNTER — Other Ambulatory Visit: Payer: Self-pay

## 2020-12-16 VITALS — BP 104/76 | HR 95 | Ht 59.0 in | Wt 135.0 lb

## 2020-12-16 DIAGNOSIS — R0602 Shortness of breath: Secondary | ICD-10-CM

## 2020-12-16 DIAGNOSIS — R002 Palpitations: Secondary | ICD-10-CM

## 2020-12-16 NOTE — Patient Instructions (Signed)
Medication Instructions:  Continue current medications  *If you need a refill on your cardiac medications before your next appointment, please call your pharmacy*   Lab Work: BNP Today  If you have labs (blood work) drawn today and your tests are completely normal, you will receive your results only by: Marland Kitchen MyChart Message (if you have MyChart) OR . A paper copy in the mail If you have any lab test that is abnormal or we need to change your treatment, we will call you to review the results.   Testing/Procedures: Your physician has recommended that you wear an 2 weeks event monitor. Event monitors are medical devices that record the heart's electrical activity. Doctors most often [place] these monitors to diagnose arrhythmias. Arrhythmias are problems with the speed or rhythm of the heartbeat. The monitor is a small, portable device. You can wear one while you do your normal daily activities. This is usually used to diagnose what is causing palpitations/syncope (passing out).  Your physician has requested that you have an echocardiogram. Echocardiography is a painless test that uses sound waves to create images of your heart. It provides your doctor with information about the size and shape of your heart and how well your heart's chambers and valves are working. This procedure takes approximately one hour. There are no restrictions for this procedure.  Follow-Up: At [facility], you and your health needs are our priority.  As part of our continuing mission to provide you with exceptional heart care, we have created designated Provider Care Teams.  These Care Teams include your primary Cardiologist (physician) and Advanced Practice Providers (APPs -  Physician Assistants and Nurse Practitioners) who all work together to provide you with the care you need, when you need it.  We recommend signing up for the patient portal called "MyChart".  Sign up information is provided on this After Visit Summary.   MyChart is used to connect with patients for Virtual Visits (Telemedicine).  Patients are able to view lab/test results, encounter notes, upcoming appointments, etc.  Non-urgent messages can be sent to your provider as well.   To learn more about what you can do with MyChart, go to [email or web address].    Your next appointment:   1 month(s)  The format for your next appointment:   In Person  Provider:   You may see Minus Breeding, MD or one of the following Advanced Practice Providers on your designated Care Team:    Rosaria Ferries, PA-C  Jory Sims, DNP, ANP    Other Instructions  [employer] Cardiac Event Monitor Instructions Your physician has requested you wear your cardiac event monitor for  14 days, (1-30). [employer] may call or text to confirm a shipping address. The monitor will be sent to a land address via UPS. [employer] will not ship a monitor to a PO BOX. It typically takes 3-5 days to receive your monitor after it has been enrolled. [employer] will assist with USPS tracking if your package is delayed. The telephone number for [employer] is [phone]. Once you have received your monitor, please review the enclosed instructions. Instruction tutorials can also be viewed under help and settings on the enclosed cell phone. Your monitor has already been registered assigning a specific monitor serial # to you.  Applying the monitor Remove cell phone from case and turn it on. The cell phone works as Dealer and needs to be within [employer] of you at all times. The cell phone will need to be charged on a  daily basis. We recommend you plug the cell phone into the enclosed charger at your bedside table every night.  Monitor batteries: You will receive two monitor batteries labelled #1 and #2. These are your recorders. Plug battery #2 onto the second connection on the enclosed charger. Keep one battery on the charger at all times. This will keep the  monitor battery deactivated. It will also keep it fully charged for when you need to switch your monitor batteries. A small light will be blinking on the battery emblem when it is charging. The light on the battery emblem will remain on when the battery is fully charged.  Open package of a Monitor strip. Insert battery #1 into black hood on strip and gently squeeze monitor battery onto connection as indicated in instruction booklet. Set aside while preparing skin.  Choose location for your strip, vertical or horizontal, as indicated in the instruction booklet. Shave to remove all hair from location. There cannot be any lotions, oils, powders, or colognes on skin where monitor is to be applied. Wipe skin clean with enclosed Saline wipe. Dry skin completely.  Peel paper labeled #1 off the back of the Monitor strip exposing the adhesive. Place the monitor on the chest in the vertical or horizontal position shown in the instruction booklet. One arrow on the monitor strip must be pointing upward. Carefully remove paper labeled #2, attaching remainder of strip to your skin. Try not to create any folds or wrinkles in the strip as you apply it.  Firmly press and release the circle in the center of the monitor battery. You will hear a small beep. This is turning the monitor battery on. The heart emblem on the monitor battery will light up every 5 seconds if the monitor battery in turned on and connected to the patient securely. Do not push and hold the circle down as this turns the monitor battery off. The cell phone will locate the monitor battery. A screen will appear on the cell phone checking the connection of your monitor strip. This may read poor connection initially but change to good connection within the next minute. Once your monitor accepts the connection you will hear a series of 3 beeps followed by a climbing crescendo of beeps. A screen will appear on the cell phone showing the  two monitor strip placement options. Touch the picture that demonstrates where you applied the monitor strip.  Your monitor strip and battery are waterproof. You are able to shower, bathe, or swim with the monitor on. They just ask you do not submerge deeper than 3 feet underwater. We recommend removing the monitor if you are swimming in a lake, river, or ocean.  Your monitor battery will need to be switched to a fully charged monitor battery approximately once a week. The cell phone will alert you of an action which needs to be made.  On the cell phone, tap for details to reveal connection status, monitor battery status, and cell phone battery status. The green dots indicates your monitor is in good status. A red dot indicates there is something that needs your attention.  To record a symptom, click the circle on the monitor battery. In 30-60 seconds a list of symptoms will appear on the cell phone. Select your symptom and tap save. Your monitor will record a sustained or significant arrhythmia regardless of you clicking the button. Some patients do not feel the heart rhythm irregularities. [employer] will notify us of any serious or critical events.  Refer to instruction booklet for instructions on switching batteries, changing strips, the Do not disturb or Pause features, or any additional questions.  Call [employer] at [phone], to confirm your monitor is transmitting and record your baseline. They will answer any questions you may have regarding the monitor instructions at that time.  Returning the monitor to [place] all equipment back into blue box. Peel off strip of paper to expose adhesive and close box securely. There is a prepaid UPS shipping label on this box. Drop in a UPS drop box, or at a UPS facility like [employer]. You may also contact [employer] to arrange UPS to pick up monitor package at your home.

## 2020-12-17 LAB — BRAIN NATRIURETIC PEPTIDE: BNP: 22.2 pg/mL (ref 0.0–100.0)

## 2020-12-30 ENCOUNTER — Other Ambulatory Visit: Payer: Self-pay | Admitting: Internal Medicine

## 2021-01-06 ENCOUNTER — Ambulatory Visit: Payer: Medicaid Other | Admitting: Internal Medicine

## 2021-01-15 ENCOUNTER — Ambulatory Visit (HOSPITAL_COMMUNITY): Payer: Medicaid Other | Attending: Cardiology

## 2021-01-15 ENCOUNTER — Other Ambulatory Visit: Payer: Self-pay

## 2021-01-15 DIAGNOSIS — R002 Palpitations: Secondary | ICD-10-CM | POA: Insufficient documentation

## 2021-01-15 DIAGNOSIS — R0602 Shortness of breath: Secondary | ICD-10-CM

## 2021-01-15 DIAGNOSIS — R072 Precordial pain: Secondary | ICD-10-CM | POA: Insufficient documentation

## 2021-01-15 LAB — ECHOCARDIOGRAM COMPLETE
Area-P 1/2: 2.99 cm2
S' Lateral: 2.9 cm

## 2021-01-15 NOTE — Progress Notes (Deleted)
Cardiology Office Note   Date:  01/15/2021   ID:  Rikki Trosper, DOB [birth year], MRN [id number]  PCP:  Mack Hook, MD  Cardiologist:   No primary care provider on file. Referring:  Mack Hook, MD  No chief complaint on file.     History of Present Illness: Stacy Jackson is a 40 y.o. female who was referred by Mack Hook, MD fo evaluation of palpitations.   An echo was unremarkable.  ***  ***  The patient has not had any prior cardiac history.  She states she has not felt well since August 2020 when she had COVID.  She states she was short of breath and very weak with that.  Since then she feels like she cannot take a deep breath.  She gets short of breath with activities.  Her chest hurts when she is trying to do stuff.  It is a sharp discomfort.  She is not describing cough fevers or chills.  She sleeps on 3 pillows but is not describing classic PND or orthopnea.  She does not have any leg swelling.  She feels her heart racing.  This happens when she is having chest discomfort more spontaneously.  Her water alert her that her rate might be greater than 120.  It lasts for 20 to 60 seconds.  It happens at rest.  It comes and goes spontaneously.  She was started on metoprolol but she does not know and thinks that has helped.  She is not had any syncope.  She has never had this kind of discomfort before.  She does not have any prior cardiac testing.  Past Medical History:  Diagnosis Date  . Abdominal pain, chronic, right lower quadrant   . Cervical cancer ([place]) 2015   Unsuccessful LEEP, then TAH  . 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 Complicated migraine 2202   Left arm numbness, states she may have had a "mini-stroke"  . Diabetes mellitus without complication ([place])   . Diverticulosis 02/15/2017  . GERD (gastroesophageal reflux disease)     with hiatal hernia.  fundoplication in 5427  . History of obesity 2010  . Ovarian cyst right   2008  . 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 Seizures ([place])   . Sickle cell anemia ([facility])   . Stroke [facility])     Past Surgical History:  Procedure Laterality Date  . CESAREAN SECTION  2003, 2008   2nd with left oophorectomy  . COLONOSCOPY WITH PROPOFOL N/A 11/22/2016   Procedure: COLONOSCOPY WITH PROPOFOL;  Surgeon: Arta Silence, MD;  Location: [facility] ENDOSCOPY;  Service: Endoscopy;  Laterality: N/A;  . ESOPHAGOGASTRODUODENOSCOPY  01/13/2015   Normal biopsy of gastric mucosa/gastric pouch  . NISSEN FUNDOPLICATION  0623  . OOPHORECTOMY Left 2008   Done at same time of Csection  . ROUX-EN-Y GASTRIC BYPASS  ?with Nissen fundoplication   Unknown year--found in GI records from [facility] [address] as bariatric procedure.  Marland Kitchen VAGINAL HYSTERECTOMY  2015   For cervical cancer unresponsive to LEEP     Current Outpatient Medications  Medication Sig Dispense Refill  . busPIRone (BUSPAR) 5 MG tablet Take 1 tablet (5 mg total) by mouth 3 (three) times daily. 90 tablet 4  . clobetasol ointment (TEMOVATE) 0.05 % Apply to affected areas twice daily as needed 60 g 2  . COSENTYX SENSOREADY, 300 MG, 150 MG/ML SOAJ  Inject into the skin.    . cyanocobalamin 100 MCG tablet Take 1,000 mcg by mouth daily.    Marland Kitchen dicyclomine (BENTYL) 10 MG capsule 1 cap by mouth every 8 hours as needed 60 capsule 2  . EPINEPHrine (EPIPEN 2-PAK) 0.3 mg/0.3 mL IJ SOAJ injection Inject 0.3 mg into the muscle as needed for anaphylaxis. 1 each 1  . famotidine (PEPCID) 20 MG tablet 3 tablets by mouth 1 hour before breakfast 90 tablet 11  . fexofenadine (ALLEGRA) 180 MG tablet 1 tab by mouth daily as needed 30 tablet 11  . gabapentin (NEURONTIN) 400 MG capsule TAKE 1 CAPSULE BY MOUTH THREE TIMES DAILY 90 capsule 11  . Iron Polysacch Cmplx-B12-FA 150-0.025-1 MG CAPS 1 cap by mouth daily 30 capsule 11  . levETIRAcetam  (KEPPRA) 1000 MG tablet Take 1 tablet by mouth twice daily 60 tablet 10  . meloxicam (MOBIC) 15 MG tablet TAKE 1 TABLET BY MOUTH DAILY 30 tablet 4  . methotrexate (RHEUMATREX) 2.5 MG tablet Take 15 mg (6 tabs) weekly. Can take 3 tabs on Sunday and 3 tabs on Monday.    . metoprolol succinate (TOPROL-XL) 25 MG 24 hr tablet Take 2 tablets (50 mg total) by mouth daily. Take with or immediately following a meal. 30 tablet 11  . naproxen sodium (ALEVE) 220 MG tablet Take 220 mg by mouth daily as needed (pain).    Marland Kitchen tiZANidine (ZANAFLEX) 4 MG tablet TAKE 1/2 TO 1 (ONE-HALF TO ONE) TABLET BY MOUTH NIGHTLY AS NEEDED MAY  REPEAT  1  TIME    . venlafaxine XR (EFFEXOR XR) 75 MG 24 hr capsule Take 1 capsule (75 mg total) by mouth daily with breakfast. 60 capsule 2   No current facility-administered medications for this visit.    Allergies:   Shrimp [shellfish allergy]    ROS:  Please see the history of present illness.   Otherwise, review of systems are positive for ***.   All other systems are reviewed and negative.    PHYSICAL EXAM: VS:  LMP 12/14/2012  , BMI There is no height or weight on file to calculate BMI. GENERAL:  Well appearing NECK:  No jugular venous distention, waveform within normal limits, carotid upstroke brisk and symmetric, no bruits, no thyromegaly LUNGS:  Clear to auscultation bilaterally CHEST:  Unremarkable HEART:  PMI not displaced or sustained,S1 and S2 within normal limits, no S3, no S4, no clicks, no rubs, *** murmurs ABD:  Flat, positive bowel sounds normal in frequency in pitch, no bruits, no rebound, no guarding, no midline pulsatile mass, no hepatomegaly, no splenomegaly EXT:  2 plus pulses throughout, no edema, no cyanosis no clubbing     ***GENERAL:  Well appearing HEENT:  Pupils equal round and reactive, fundi not visualized, oral mucosa unremarkable NECK:  No jugular venous distention, waveform within normal limits, carotid upstroke brisk and symmetric, no  bruits, no thyromegaly LYMPHATICS:  No cervical, inguinal adenopathy LUNGS:  Clear to auscultation bilaterally BACK:  No CVA tenderness CHEST:  Unremarkable HEART:  PMI not displaced or sustained,S1 and S2 within normal limits, no S3, no S4, no clicks, no rubs, no murmurs ABD:  Flat, positive bowel sounds normal in frequency in pitch, no bruits, no rebound, no guarding, no midline pulsatile mass, no hepatomegaly, no splenomegaly EXT:  2 plus pulses throughout, no edema, no cyanosis no clubbing SKIN:  No rashes no nodules NEURO:  Cranial nerves II through XII grossly intact, motor grossly intact throughout PSYCH:  Cognitively intact,  oriented to person place and time    EKG:  EKG is *** ordered today. The ekg ordered today demonstrates sinus rhythm, rate ***, axis within normal limits, intervals within normal limits, no acute ST-T wave changes.  Poor anterior R wave progression possibly lead placement but could not exclude old anteroseptal MI.   Recent Labs: 10/29/2020: ALT 13; BUN 10; Creatinine, Ser 0.65; Hemoglobin 9.3; Platelets 252; Potassium 4.4; Sodium 137; TSH 0.538 12/16/2020: BNP 22.2    Lipid Panel    Component Value Date/Time   CHOL 245 (H) 10/29/2020 1118   TRIG 56 10/29/2020 1118   HDL 113 10/29/2020 1118   LDLCALC 123 (H) 10/29/2020 1118      Wt Readings from Last 3 Encounters:  12/16/20 135 lb (61.2 kg)  10/29/20 144 lb 8 oz (65.5 kg)  08/29/20 130 lb 8 oz (59.2 kg)      Other studies Reviewed: Additional studies/ records that were reviewed today include: Labs. Review of the above records demonstrates:  Please see elsewhere in the note.     ASSESSMENT AND PLAN:  PALPITATIONS :  ***   The patient has tachypalpitations.  She will need a 2-week event monitor.  I do see that she has normal electrolytes and thyroid.  SOB:    BNP was normal .  ***  I will check a BNP level.  She will also need an echocardiogram.  CHEST PAIN:  Echo was unremarkable.  ***   I do  not strongly suspect pericarditis myocarditis or obstructive coronary disease.  I will start with the testing as above and have a low threshold however for further testing such as a POET (Plain Old Exercise Treadmill)   Current medicines are reviewed at length with the patient today.  The patient does not have concerns regarding medicines.  The following changes have been made:  ***  Labs/ tests ordered today include:  ***  No orders of the defined types were placed in this encounter.    Disposition:   FU with me in ***   Signed, Minus Breeding, MD  01/15/2021 7:29 PM    [facility]

## 2021-01-16 ENCOUNTER — Telehealth: Payer: Self-pay | Admitting: Cardiology

## 2021-01-16 ENCOUNTER — Ambulatory Visit: Payer: Medicaid Other | Admitting: Cardiology

## 2021-01-16 DIAGNOSIS — R002 Palpitations: Secondary | ICD-10-CM

## 2021-01-16 DIAGNOSIS — R0602 Shortness of breath: Secondary | ICD-10-CM

## 2021-01-16 DIAGNOSIS — R072 Precordial pain: Secondary | ICD-10-CM

## 2021-01-16 NOTE — Telephone Encounter (Signed)
    Pt said she haven't receive heart monitor Dr. Rosezella [place] ordered for her

## 2021-01-16 NOTE — Telephone Encounter (Signed)
Apologized to patient for delay in Cardiac event monitor being enrolled to ship.  Order for Cardiac Event Monitor was linked to her office visit appointment with Dr. Percival Spanish on 12/16/20, consequently, not dropping to the monitor department work que. Patient enrolled for [employer] to ship a 14 day cardiac event monitor to her home.  She is aware [employer] will be calling today to confirm a shipping address.

## 2021-01-20 ENCOUNTER — Ambulatory Visit (INDEPENDENT_AMBULATORY_CARE_PROVIDER_SITE_OTHER): Payer: Medicaid Other

## 2021-01-20 DIAGNOSIS — R002 Palpitations: Secondary | ICD-10-CM

## 2021-01-21 ENCOUNTER — Encounter (HOSPITAL_BASED_OUTPATIENT_CLINIC_OR_DEPARTMENT_OTHER): Payer: Self-pay | Admitting: Emergency Medicine

## 2021-01-21 ENCOUNTER — Telehealth: Payer: Self-pay | Admitting: Internal Medicine

## 2021-01-21 ENCOUNTER — Other Ambulatory Visit: Payer: Self-pay

## 2021-01-21 ENCOUNTER — Inpatient Hospital Stay (HOSPITAL_BASED_OUTPATIENT_CLINIC_OR_DEPARTMENT_OTHER)
Admission: EM | Admit: 2021-01-21 | Discharge: 2021-01-24 | DRG: 556 | Disposition: A | Discharge status: Home / Self Care | Payer: Medicaid Other | Attending: Internal Medicine | Admitting: Internal Medicine

## 2021-01-21 ENCOUNTER — Emergency Department (HOSPITAL_BASED_OUTPATIENT_CLINIC_OR_DEPARTMENT_OTHER): Payer: Medicaid Other

## 2021-01-21 DIAGNOSIS — D509 Iron deficiency anemia, unspecified: Secondary | ICD-10-CM | POA: Diagnosis present

## 2021-01-21 DIAGNOSIS — Z91013 Allergy to seafood: Secondary | ICD-10-CM

## 2021-01-21 DIAGNOSIS — R748 Abnormal levels of other serum enzymes: Secondary | ICD-10-CM

## 2021-01-21 DIAGNOSIS — Z8249 Family history of ischemic heart disease and other diseases of the circulatory system: Secondary | ICD-10-CM

## 2021-01-21 DIAGNOSIS — Z9884 Bariatric surgery status: Secondary | ICD-10-CM

## 2021-01-21 DIAGNOSIS — D571 Sickle-cell disease without crisis: Secondary | ICD-10-CM | POA: Diagnosis present

## 2021-01-21 DIAGNOSIS — M6281 Muscle weakness (generalized): Principal | ICD-10-CM | POA: Diagnosis present

## 2021-01-21 DIAGNOSIS — F32A Depression, unspecified: Secondary | ICD-10-CM | POA: Diagnosis present

## 2021-01-21 DIAGNOSIS — Z803 Family history of malignant neoplasm of breast: Secondary | ICD-10-CM

## 2021-01-21 DIAGNOSIS — R531 Weakness: Secondary | ICD-10-CM

## 2021-01-21 DIAGNOSIS — I10 Essential (primary) hypertension: Secondary | ICD-10-CM | POA: Diagnosis present

## 2021-01-21 DIAGNOSIS — L4050 Arthropathic psoriasis, unspecified: Secondary | ICD-10-CM | POA: Diagnosis present

## 2021-01-21 DIAGNOSIS — Z833 Family history of diabetes mellitus: Secondary | ICD-10-CM

## 2021-01-21 DIAGNOSIS — G8929 Other chronic pain: Secondary | ICD-10-CM | POA: Diagnosis present

## 2021-01-21 DIAGNOSIS — Z832 Family history of diseases of the blood and blood-forming organs and certain disorders involving the immune mechanism: Secondary | ICD-10-CM

## 2021-01-21 DIAGNOSIS — R5383 Other fatigue: Secondary | ICD-10-CM

## 2021-01-21 DIAGNOSIS — F419 Anxiety disorder, unspecified: Secondary | ICD-10-CM | POA: Diagnosis present

## 2021-01-21 DIAGNOSIS — Z20822 Contact with and (suspected) exposure to covid-19: Secondary | ICD-10-CM | POA: Diagnosis present

## 2021-01-21 DIAGNOSIS — G40909 Epilepsy, unspecified, not intractable, without status epilepticus: Secondary | ICD-10-CM | POA: Diagnosis present

## 2021-01-21 DIAGNOSIS — K219 Gastro-esophageal reflux disease without esophagitis: Secondary | ICD-10-CM | POA: Diagnosis present

## 2021-01-21 DIAGNOSIS — Z8541 Personal history of malignant neoplasm of cervix uteri: Secondary | ICD-10-CM

## 2021-01-21 DIAGNOSIS — Z8051 Family history of malignant neoplasm of kidney: Secondary | ICD-10-CM

## 2021-01-21 DIAGNOSIS — Z825 Family history of asthma and other chronic lower respiratory diseases: Secondary | ICD-10-CM

## 2021-01-21 DIAGNOSIS — K859 Acute pancreatitis without necrosis or infection, unspecified: Secondary | ICD-10-CM

## 2021-01-21 LAB — CBC WITH DIFFERENTIAL/PLATELET
Abs Immature Granulocytes: 0.01 10*3/uL (ref 0.00–0.07)
Basophils Absolute: 0.1 10*3/uL (ref 0.0–0.1)
Basophils Relative: 1 %
Eosinophils Absolute: 0.1 10*3/uL (ref 0.0–0.5)
Eosinophils Relative: 1 %
HCT: 30.3 % — ABNORMAL LOW (ref 36.0–46.0)
Hemoglobin: 9.9 g/dL — ABNORMAL LOW (ref 12.0–15.0)
Immature Granulocytes: 0 %
Lymphocytes Relative: 29 %
Lymphs Abs: 1.3 10*3/uL (ref 0.7–4.0)
MCH: 24.1 pg — ABNORMAL LOW (ref 26.0–34.0)
MCHC: 32.7 g/dL (ref 30.0–36.0)
MCV: 73.7 fL — ABNORMAL LOW (ref 80.0–100.0)
Monocytes Absolute: 0.5 10*3/uL (ref 0.1–1.0)
Monocytes Relative: 10 %
Neutro Abs: 2.7 10*3/uL (ref 1.7–7.7)
Neutrophils Relative %: 59 %
Platelets: 261 10*3/uL (ref 150–400)
RBC: 4.11 MIL/uL (ref 3.87–5.11)
RDW: 21.5 % — ABNORMAL HIGH (ref 11.5–15.5)
Smear Review: NORMAL
WBC: 4.8 10*3/uL (ref 4.0–10.5)
nRBC: 0 % (ref 0.0–0.2)

## 2021-01-21 LAB — COMPREHENSIVE METABOLIC PANEL
ALT: 21 U/L (ref 0–44)
AST: 30 U/L (ref 15–41)
Albumin: 3.7 g/dL (ref 3.5–5.0)
Alkaline Phosphatase: 56 U/L (ref 38–126)
Anion gap: 13 (ref 5–15)
BUN: 10 mg/dL (ref 6–20)
CO2: 24 mmol/L (ref 22–32)
Calcium: 8.4 mg/dL — ABNORMAL LOW (ref 8.9–10.3)
Chloride: 102 mmol/L (ref 98–111)
Creatinine, Ser: 0.57 mg/dL (ref 0.44–1.00)
GFR, Estimated: >60 mL/min (ref >60)
Glucose, Bld: 93 mg/dL (ref 70–99)
Potassium: 4 mmol/L (ref 3.5–5.1)
Sodium: 139 mmol/L (ref 135–145)
Total Bilirubin: 0.3 mg/dL (ref 0.3–1.2)
Total Protein: 6.7 g/dL (ref 6.5–8.1)

## 2021-01-21 LAB — LIPASE, BLOOD: Lipase: 196 U/L — ABNORMAL HIGH (ref 11–51)

## 2021-01-21 IMAGING — CT CT HEAD W/O CM
3 series · 14 of 47 positions shown, 16 images · non-contrast
Comparison: None.

CLINICAL DATA: Weakness.

EXAM:
CT HEAD WITHOUT CONTRAST
TECHNIQUE: Contiguous axial images were obtained from the base of the skull
through the vertex without intravenous contrast.

[Series 2: head wo · axial · 0.46mm/px · z∈[-182,-27]mm · 8 of 37 slices shown, 10 images]
[im 3/37  brain]
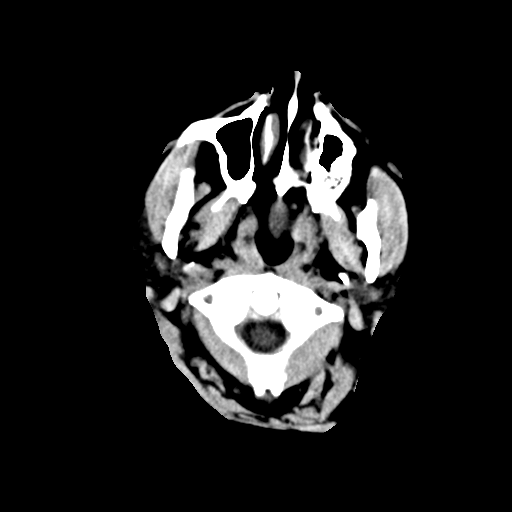
[im 3/37  bone]
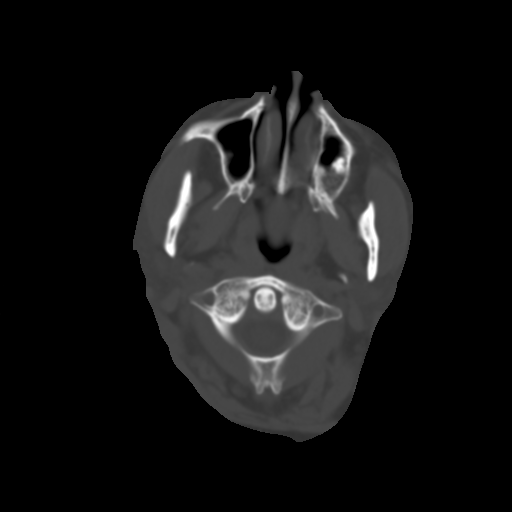
[im 8/37  brain]
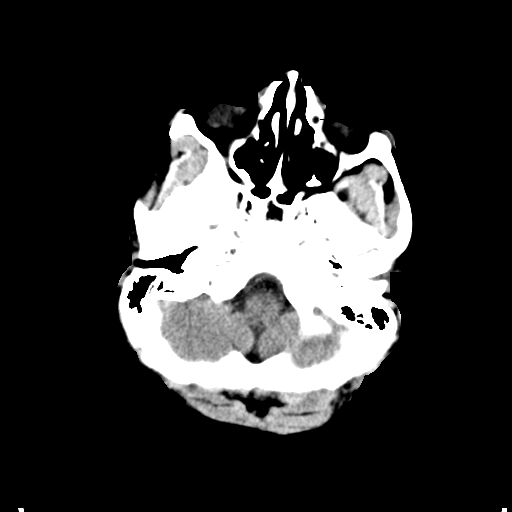
[im 12/37  brain]
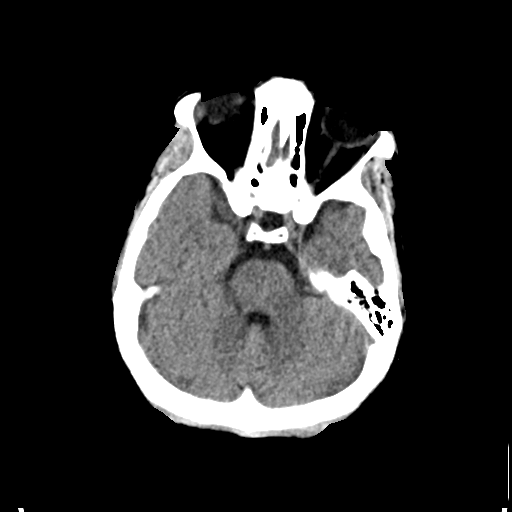
[im 17/37  brain]
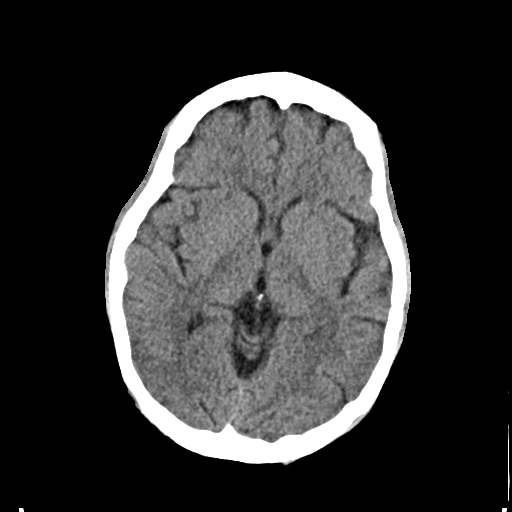
[im 20/37  brain]
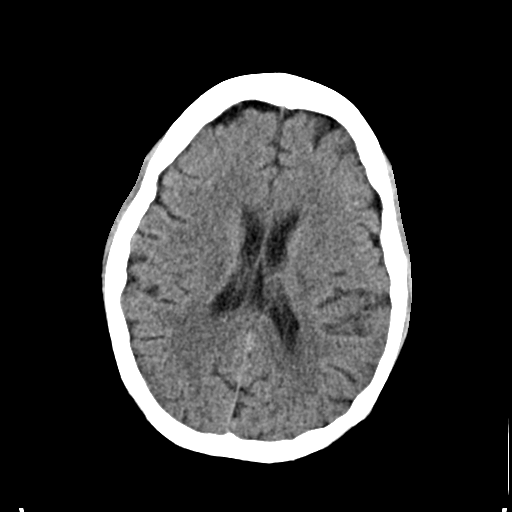
[im 20/37  bone]
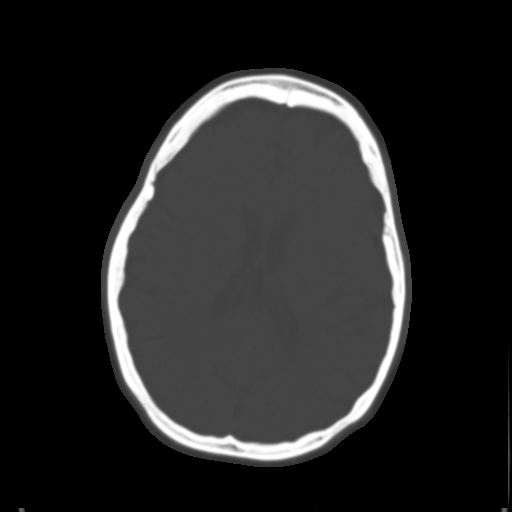
[im 25/37  brain]
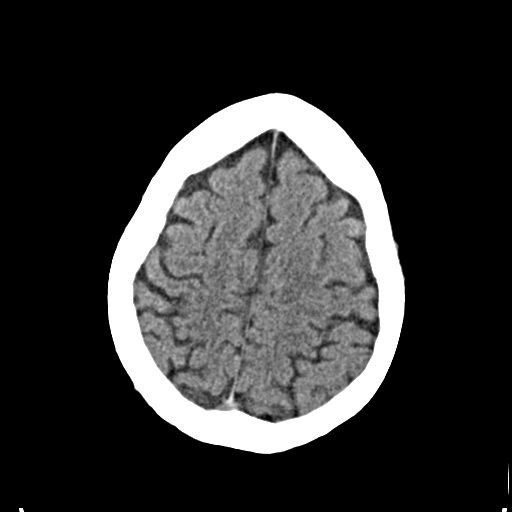
[im 29/37  brain]
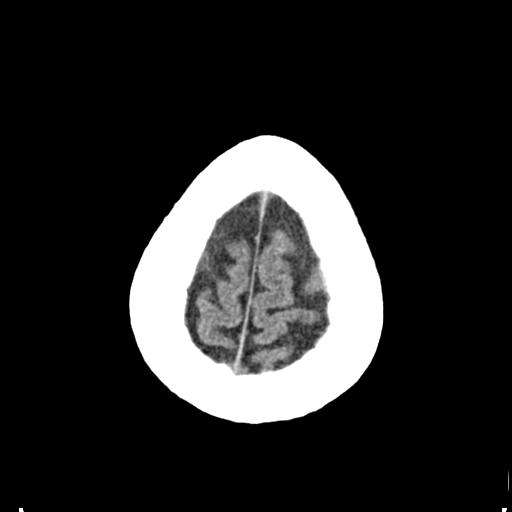
[im 34/37  brain]
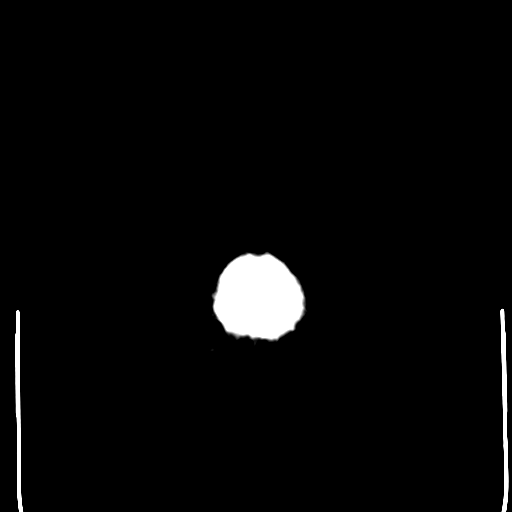

[Series 4: coronal soft · coronal · 0.35mm/px · 3 of 67 slices shown]
[im 23/67  brain]
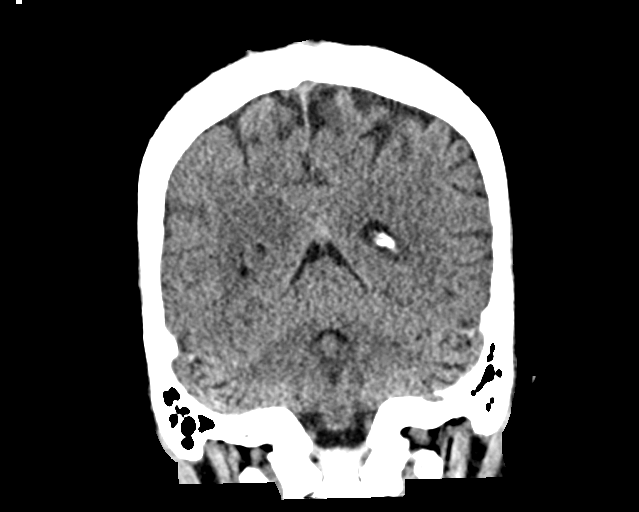
[im 30/67  brain]
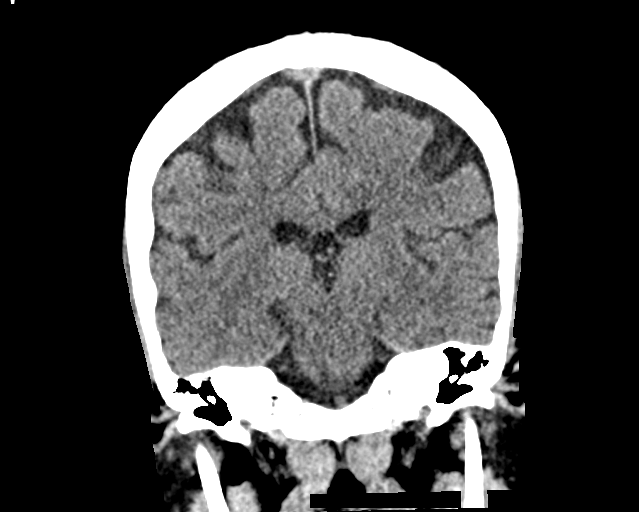
[im 37/67  brain]
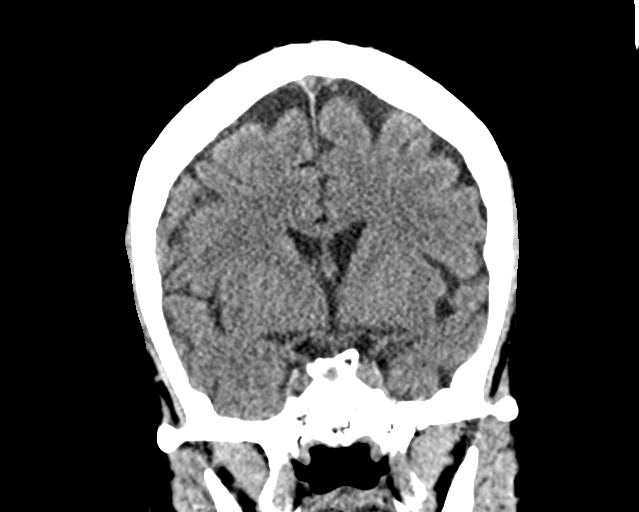

[Series 5: sag soft · sagittal · 0.35mm/px · 3 of 67 slices shown]
[im 23/67  brain]
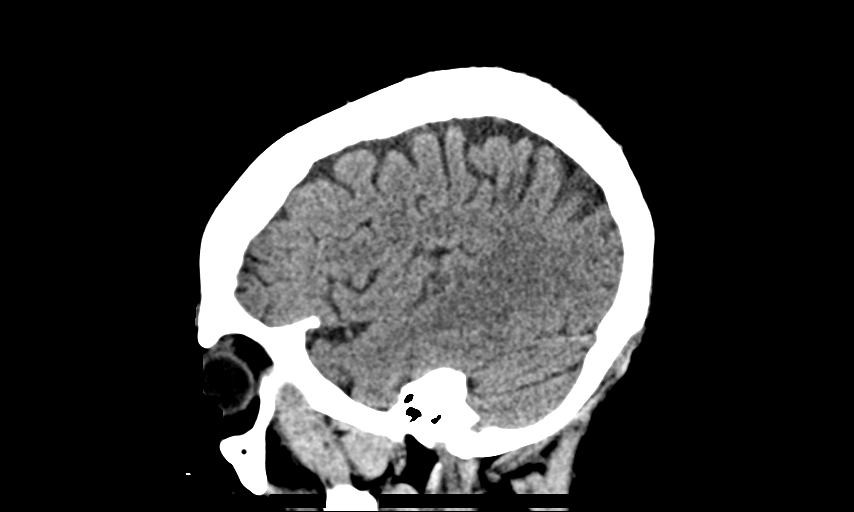
[im 34/67  brain]
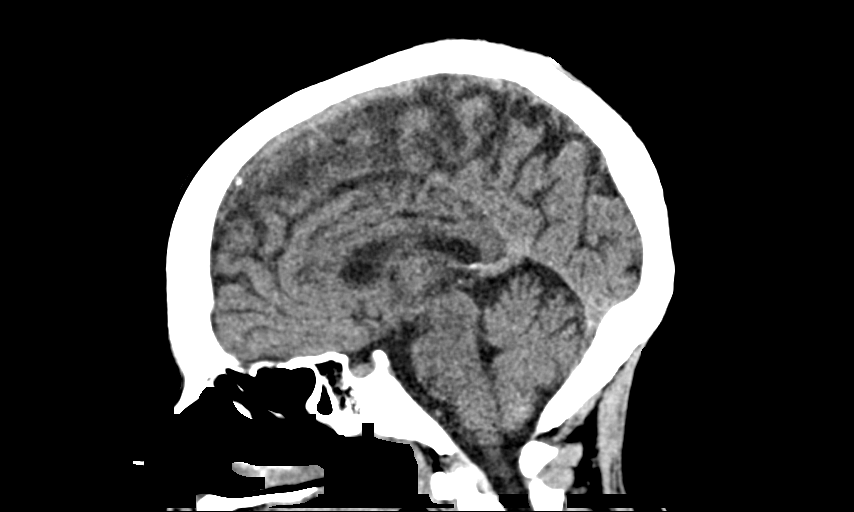
[im 45/67  brain]
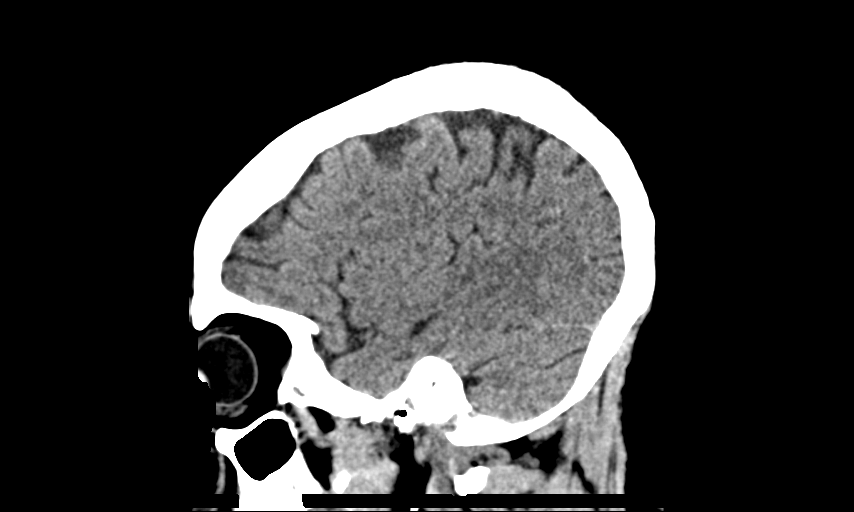

[14 of 47 positions shown; findings below may reference images not displayed]

FINDINGS: Brain: No evidence of acute infarction, hemorrhage, hydrocephalus,
extra-axial collection or mass lesion/mass effect.

Vascular: No hyperdense vessel or unexpected calcification.

Skull: Normal. Negative for fracture or focal lesion.

Sinuses/Orbits: No acute finding.

Other: None.
IMPRESSION: No acute intracranial abnormality.

## 2021-01-21 IMAGING — CT CT ABD-PELV W/ CM
2 of 5 series · 16 of 46 positions shown, 18 images · IV contrast (Omnipaque)
Comparison: None.

CLINICAL DATA: Epigastric pain

EXAM:
CT ABDOMEN AND PELVIS WITH CONTRAST
TECHNIQUE: Multidetector CT imaging of the abdomen and pelvis was performed
using the standard protocol following bolus administration of
intravenous contrast.
CONTRAST:  100mL OMNIPAQUE IOHEXOL 300 MG/ML  SOLN

[Series 2: axial st · axial · 0.87mm/px · z∈[+154,+534]mm · 13 of 86 slices shown, 15 images]
[im 5/86  soft-tissue]
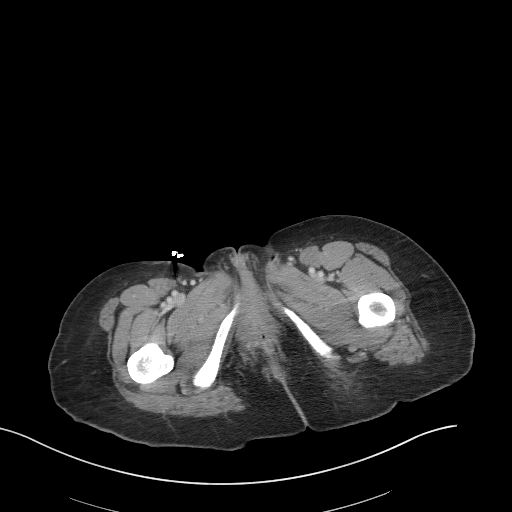
[im 5/86  bone]
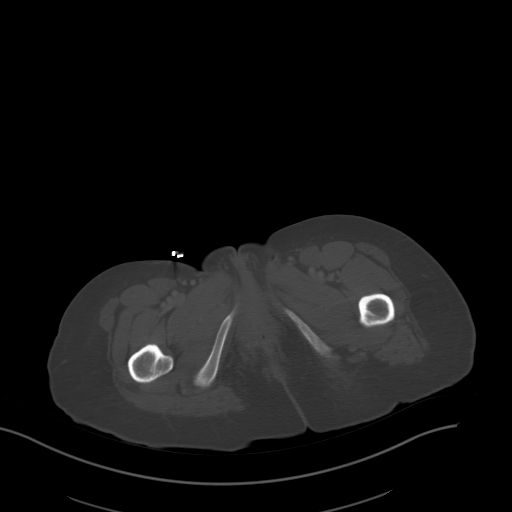
[im 14/86  soft-tissue]
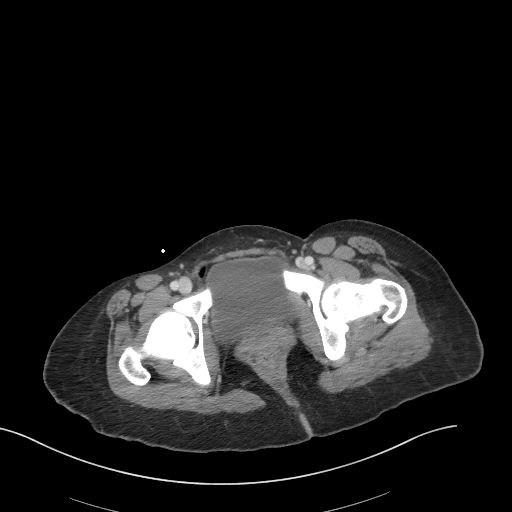
[im 18/86  soft-tissue]
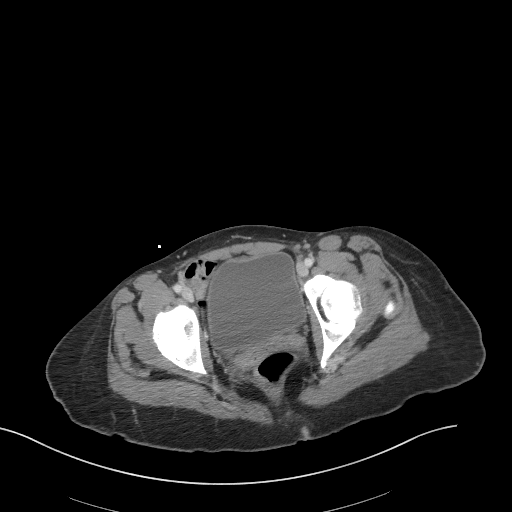
[im 23/86  soft-tissue]
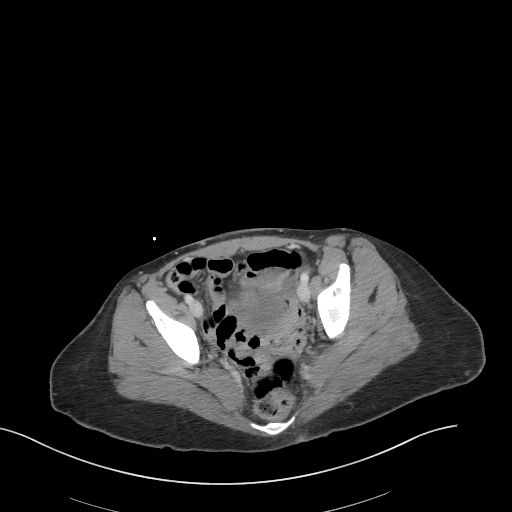
[im 32/86  soft-tissue]
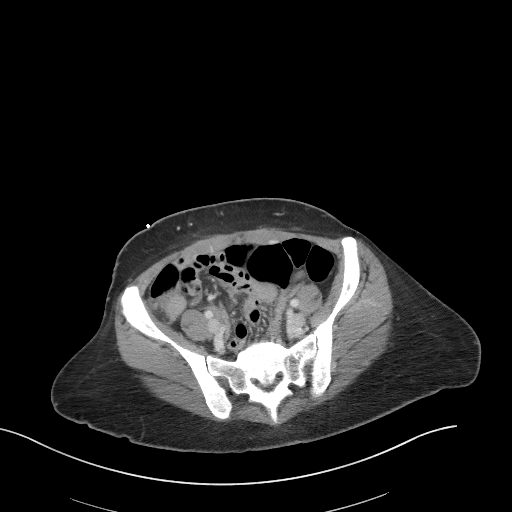
[im 36/86  soft-tissue]
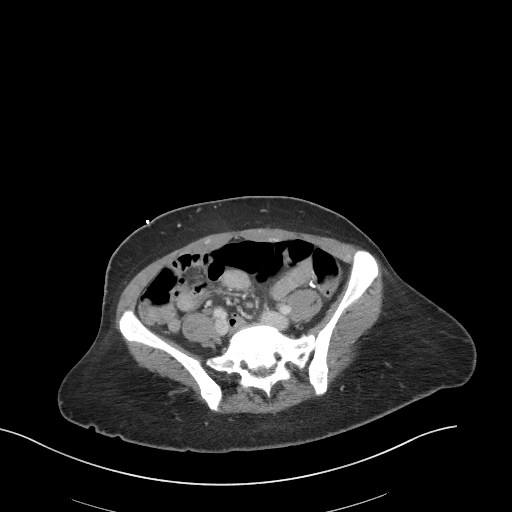
[im 45/86  soft-tissue]
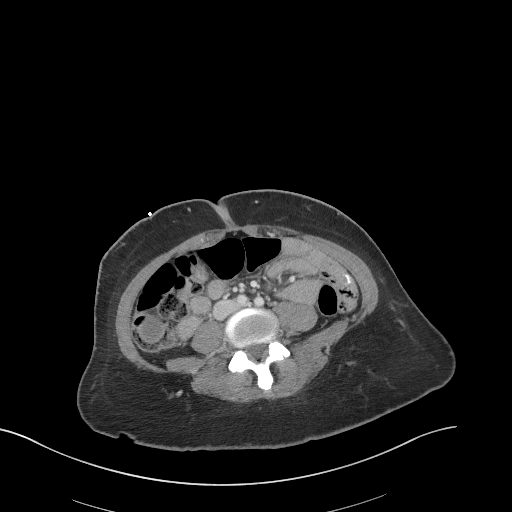
[im 50/86  soft-tissue]
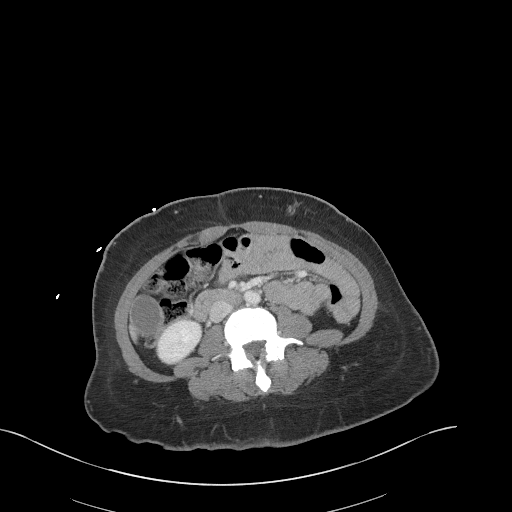
[im 54/86  soft-tissue]
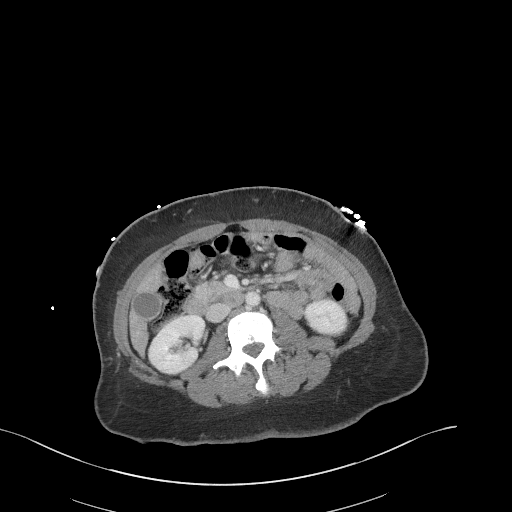
[im 54/86  bone]
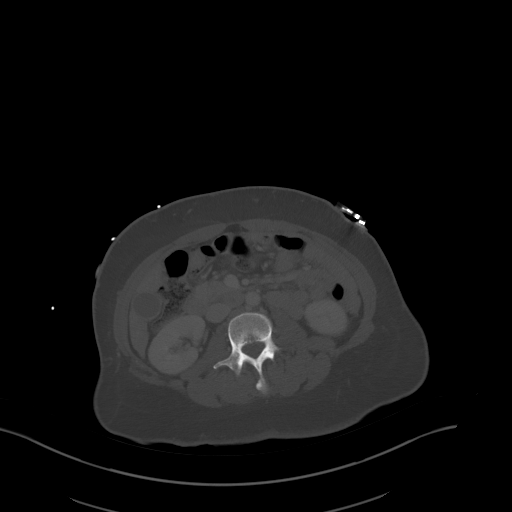
[im 63/86  soft-tissue]
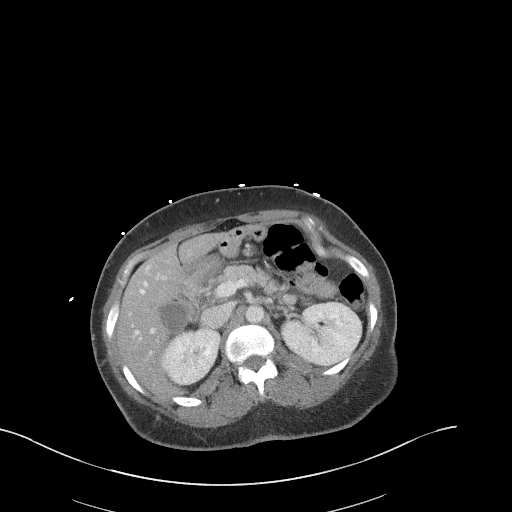
[im 68/86  soft-tissue]
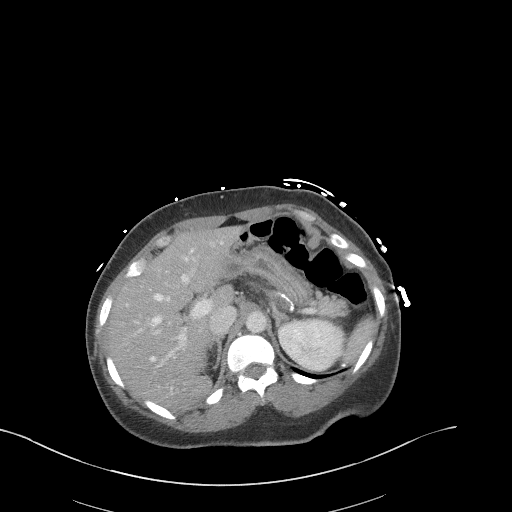
[im 72/86  soft-tissue]
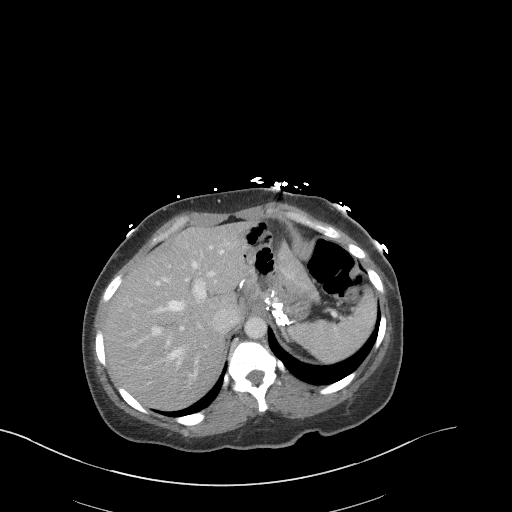
[im 81/86  soft-tissue]
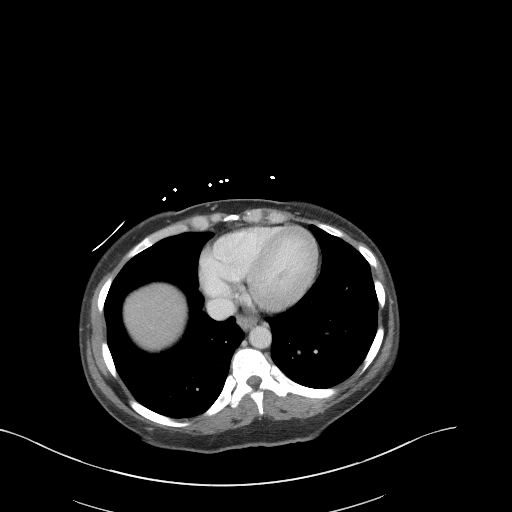

[Series 5: coronal st · coronal · 0.65mm/px · 3 of 75 slices shown]
[im 25/75  soft-tissue]
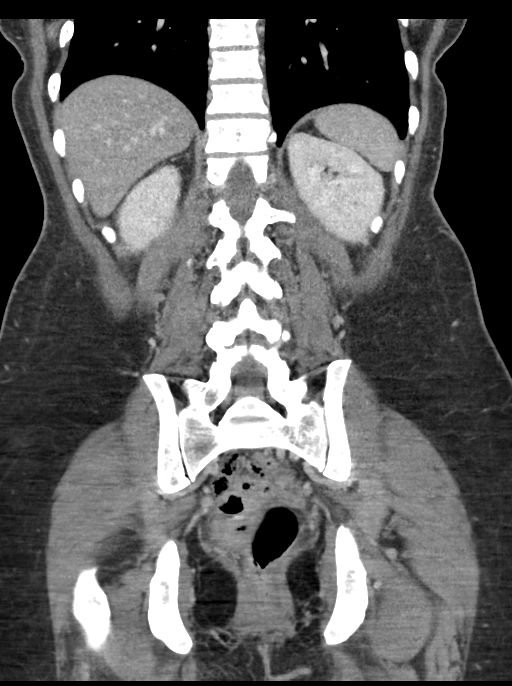
[im 33/75  soft-tissue]
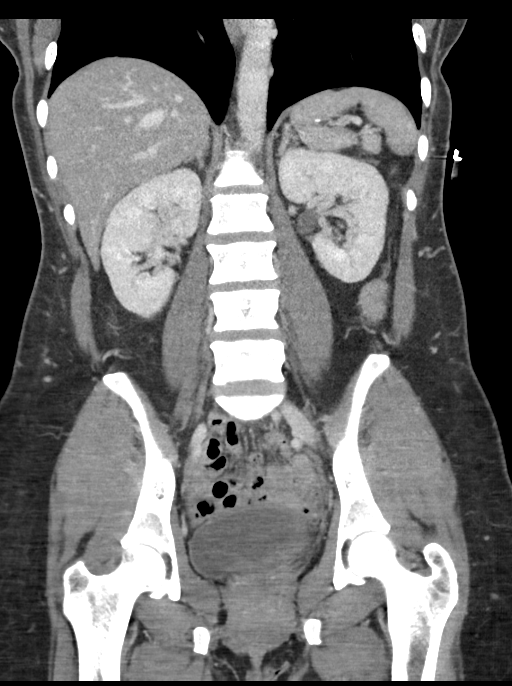
[im 42/75  soft-tissue]
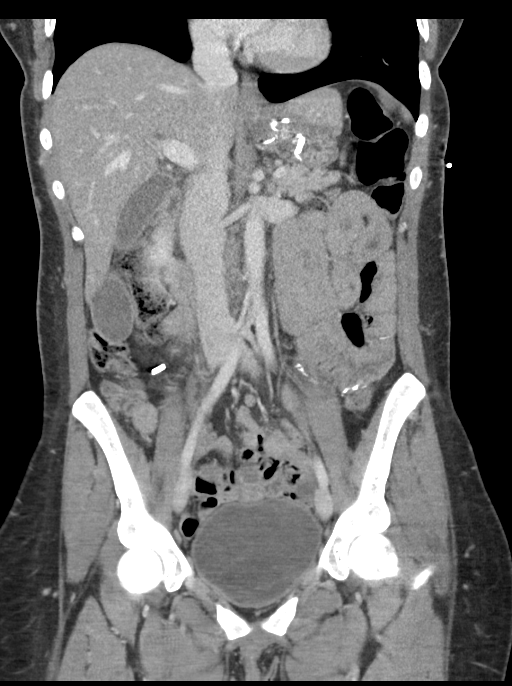

[16 of 46 positions shown; findings below may reference images not displayed]

FINDINGS: Lower chest: Lung bases demonstrate no acute consolidation or
effusion. Normal cardiac size.

Hepatobiliary: No calcified gallstone. No biliary dilatation. 14 mm
enhancing lesion within the posterior right hepatic lobe, series 2,
image 21.

Pancreas: No ductal dilatation. Questionable mild fluid around the
pancreas uncinate process.

Spleen: Normal in size without focal abnormality.

Adrenals/Urinary Tract: Adrenal glands are unremarkable. Kidneys are
normal, without renal calculi, focal lesion, or hydronephrosis.
Bladder is unremarkable.

Stomach/Bowel: Status post gastric bypass. No dilated small bowel.
No acute bowel wall thickening.

Vascular/Lymphatic: No significant vascular findings are present. No
enlarged abdominal or pelvic lymph nodes.

Reproductive: Status post hysterectomy.  No adnexal mass

Other: Negative for free air or free fluid.

Musculoskeletal: No acute or significant osseous findings.
IMPRESSION: 1. Questionable small amount of fluid at the uncinate process of the
pancreas, suggest correlation with enzymes to exclude pancreatitis.
2. Status post gastric bypass without evidence for bowel
obstruction.
3. 14 mm enhancing lesion within the posterior right hepatic lobe
possibly a hemangioma though consider confirmation with
nonemergent/outpatient MRI.

## 2021-01-21 MED ORDER — SODIUM CHLORIDE 0.9 % IV BOLUS
1000.0000 mL | Freq: Once | INTRAVENOUS | Status: AC
  Administered 2021-01-21: 1000 mL via INTRAVENOUS

## 2021-01-21 MED ORDER — IOHEXOL 300 MG/ML  SOLN
100.0000 mL | Freq: Once | INTRAMUSCULAR | Status: AC | PRN
  Administered 2021-01-21: 100 mL via INTRAVENOUS

## 2021-01-21 MED ORDER — LORAZEPAM 2 MG/ML IJ SOLN
1.0000 mg | Freq: Once | INTRAMUSCULAR | Status: AC
  Administered 2021-01-21: 1 mg via INTRAVENOUS
  Filled 2021-01-21: qty 1

## 2021-01-21 NOTE — Telephone Encounter (Signed)
Received a call from [employer] for an 8 beat run of NSVT at 8:28pm. On review of the patient's chart, she is in the ER at this time for the evaluation of weakness this afternoon and had her episode while in the ER. As such, the brief run of NSVT was not the cause of her symptoms that brought her to the ER. Will update Dr. Percival Spanish on the finding.  Alric Quan, MD Cardiology [place]

## 2021-01-21 NOTE — ED Triage Notes (Signed)
Patient arrived via [facility] c/o generalized weakness with foggy thoughts starting this afternoon. Patient states similar episode last month and is being tracked for same. Patient is AO x 4, VS WDL, unable to stand without assistance.

## 2021-01-21 NOTE — ED Provider Notes (Signed)
[place] EMERGENCY DEPARTMENT Provider Note   CSN: [id number] Arrival date & time: 01/21/21  1958     History Chief Complaint  Patient presents with  . Weakness    Stacy Jackson is a 40 y.o. female.  SEE MDM for more details.  Patient here with full weakness of her body.  Her lower body weakness that now progressed to her upper body.  Unable to walk or move her arms.  History of CIDP, seizures.  However not on any medications for CIDP and has not had the symptoms prior.  Denies any fever or chills.  Also having some abdominal pain.  Started new medication for psoriasis.  Denies any seizure activity today.  Denies any anxiety or depression.  The history is provided by the patient.  Neurologic Problem This is a new problem. The current episode started 6 to 12 hours ago. The problem occurs constantly. The problem has not changed since onset.Associated symptoms include abdominal pain. Pertinent negatives include no chest pain, no headaches and no shortness of breath. Nothing aggravates the symptoms. Nothing relieves the symptoms. She has tried nothing for the symptoms. The treatment provided no relief.       Past Medical History:  Diagnosis Date  . Abdominal pain, chronic, right lower quadrant   . Cervical cancer ([place]) 2015   Unsuccessful LEEP, then TAH  . 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 Complicated migraine 5465   Left arm numbness, states she may have had a "mini-stroke"  . Diabetes mellitus without complication ([place])   . Diverticulosis 02/15/2017  . GERD (gastroesophageal reflux disease)    with hiatal hernia.  fundoplication in 6812  . History of obesity 2010  . Ovarian cyst right   2008  . Seizure disorder ([place])    Felt to be pseudoseizures most likely  . Seizures ([place])   . Sickle cell anemia ([facility])   . Stroke [facility])     Patient Active Problem  List   Diagnosis Date Noted  . SOB (shortness of breath) 01/15/2021  . Precordial chest pain 01/15/2021  . Hypertension 04/20/2020  . Tachycardia 04/20/2020  . Psoriasis 04/20/2020  . Palpitations 12/05/2019  . Slurred speech 12/05/2019  . Weakness on left side of face 12/05/2019  . Costochondritis 12/05/2019  . Skin rash 09/02/2019  . Abdominal pain, chronic, right lower quadrant   . Seizure disorder ([place])   . Seizure ([place]) 07/11/2018  . Herpes 09/19/2017  . Diverticulosis 02/15/2017  . Internal hemorrhoids 02/15/2017  . Hematochezia 11/16/2016  . History of diabetes mellitus, type II 08/17/2016  . GERD (gastroesophageal reflux disease) 08/17/2016  . Anemia 08/17/2016  . Chronic pelvic pain in female 06/24/2016    Past Surgical History:  Procedure Laterality Date  . CESAREAN SECTION  2003, 2008   2nd with left oophorectomy  . COLONOSCOPY WITH PROPOFOL N/A 11/22/2016   Procedure: COLONOSCOPY WITH PROPOFOL;  Surgeon: Arta Silence, MD;  Location: [facility] END[facility]COPY;  Service: Endoscopy;  Laterality: N/A;  . ESOPHAGOGASTRODUODENOSCOPY  01/13/2015   Normal biopsy of gastric mucosa/gastric pouch  . NISSEN FUNDOPLICATION  7517  . OOPHORECTOMY Left 2008   Done at same time of Csection  . ROUX-EN-Y GASTRIC BYPASS  ?with Nissen fundoplication   Unknown year--found in GI records from [facility] as bariatric procedure.  Marland Kitchen VAGINAL HYSTERECTOMY  2015   For cervical cancer unresponsive to LEEP     OB History  No obstetric history on file.     Family History  Problem Relation Age of Onset  . Renal cancer Mother   . Hypertension Mother   . Ulcerative colitis Father   . Diabetes Father   . Hypertension Father   . Diabetes Brother   . Hypertension Brother   . Asthma Brother   . Sickle cell trait Daughter   . Eczema Son   . Autism spectrum disorder Son   . Asthma Brother   . Eczema Brother   . Breast cancer Maternal Grandmother        diagnosed late 32s and died mid 44s.     Social History   Tobacco Use  . Smoking status: Never Smoker  . Smokeless tobacco: Never Used  Vaping Use  . Vaping Use: Never used  Substance Use Topics  . Alcohol use: No    Alcohol/week: 0.0 standard drinks  . Drug use: No    Home Medications Prior to Admission medications   Medication Sig Start Date End Date Taking? Authorizing Provider  busPIRone (BUSPAR) 5 MG tablet Take 1 tablet (5 mg total) by mouth 3 (three) times daily. 08/29/20   Mack Hook, MD  clobetasol ointment (TEMOVATE) 0.05 % Apply to affected areas twice daily as needed 01/28/20   Mack Hook, MD  COSENTYX SENSOREADY, 300 MG, 150 MG/ML SOAJ Inject into the skin. 10/28/20   [provider]  cyanocobalamin 100 MCG tablet Take 1,000 mcg by mouth daily.    [provider]  dicyclomine (BENTYL) 10 MG capsule 1 cap by mouth every 8 hours as needed 04/29/20   Mack Hook, MD  EPINEPHrine (EPIPEN 2-PAK) 0.3 mg/0.3 mL IJ SOAJ injection Inject 0.3 mg into the muscle as needed for anaphylaxis. 10/29/20   Mack Hook, MD  famotidine (PEPCID) 20 MG tablet 3 tablets by mouth 1 hour before breakfast 04/18/20   Mack Hook, MD  fexofenadine [facility]) 180 MG tablet 1 tab by mouth daily as needed 11/22/17   Mack Hook, MD  gabapentin (NEURONTIN) 400 MG capsule TAKE 1 CAPSULE BY MOUTH THREE TIMES DAILY 04/18/20   Mack Hook, MD  Iron Polysacch Cmplx-B12-FA 150-0.025-1 MG CAPS 1 cap by mouth daily 08/29/20   Mack Hook, MD  levETIRAcetam (KEPPRA) 1000 MG tablet Take 1 tablet by mouth twice daily 12/31/20   Mack Hook, MD  meloxicam (MOBIC) 15 MG tablet TAKE 1 TABLET BY MOUTH DAILY 06/09/18   Mack Hook, MD  methotrexate (RHEUMATREX) 2.5 MG tablet Take 15 mg (6 tabs) weekly. Can take 3 tabs on Sunday and 3 tabs on Monday. 01/15/20   [provider]  metoprolol succinate (TOPROL-XL) 25 MG 24 hr tablet Take 2 tablets (50 mg total) by  mouth daily. Take with or immediately following a meal. 01/28/20   Mack Hook, MD  naproxen sodium (ALEVE) 220 MG tablet Take 220 mg by mouth daily as needed (pain).    [provider]  tiZANidine (ZANAFLEX) 4 MG tablet TAKE 1/2 TO 1 (ONE-HALF TO ONE) TABLET BY MOUTH NIGHTLY AS NEEDED MAY  REPEAT  1  TIME 01/08/19   [provider]  venlafaxine XR (EFFEXOR XR) 75 MG 24 hr capsule Take 1 capsule (75 mg total) by mouth daily with breakfast. 10/29/20   Mack Hook, MD    Allergies    Shrimp [shellfish allergy]  Review of Systems   Review of Systems  Constitutional: Negative for chills and fever.  HENT: Negative for ear pain and sore throat.   Eyes:  Negative for pain and visual disturbance.  Respiratory: Negative for cough and shortness of breath.   Cardiovascular: Negative for chest pain and palpitations.  Gastrointestinal: Positive for abdominal pain. Negative for vomiting.  Genitourinary: Negative for dysuria and hematuria.  Musculoskeletal: Positive for gait problem. Negative for arthralgias, back pain, neck pain and neck stiffness.  Skin: Negative for color change and rash.  Neurological: Positive for speech difficulty and weakness. Negative for dizziness, tremors, seizures, syncope, facial asymmetry, light-headedness, numbness and headaches.  All other systems reviewed and are negative.   Physical Exam Updated Vital Signs  ED Triage Vitals  Enc Vitals Group     BP 01/21/21 2012 (!) 135/100     Pulse Rate 01/21/21 2012 (!) 102     Resp 01/21/21 2012 16     Temp 01/21/21 2012 98.2 F (36.8 C)     Temp Source 01/21/21 2012 Oral     SpO2 01/21/21 2012 95 %     Weight 01/21/21 2008 134 lb 6.4 oz (61 kg)     Height 01/21/21 2008 4\' 11"  (1.499 m)     Head Circumference --      Peak Flow --      Pain Score 01/21/21 2008 8     Pain Loc --      Pain Edu? --      Excl. in [place]? --     Physical Exam Vitals and nursing note reviewed.  Constitutional:       General: She is not in acute distress.    Appearance: She is well-developed. She is not ill-appearing.  HENT:     Head: Normocephalic and atraumatic.     Nose: Nose normal.     Mouth/Throat:     Mouth: Mucous membranes are moist.  Eyes:     Extraocular Movements: Extraocular movements intact.     Conjunctiva/sclera: Conjunctivae normal.     Pupils: Pupils are equal, round, and reactive to light.  Cardiovascular:     Rate and Rhythm: Normal rate and regular rhythm.     Pulses: Normal pulses.     Heart sounds: Normal heart sounds. No murmur heard.   Pulmonary:     Effort: Pulmonary effort is normal. No respiratory distress.     Breath sounds: Normal breath sounds.  Abdominal:     General: Abdomen is flat.     Palpations: Abdomen is soft.     Tenderness: There is abdominal tenderness (epigastric).  Musculoskeletal:        General: Normal range of motion.     Cervical back: Normal range of motion and neck supple. No rigidity.  Skin:    General: Skin is warm and dry.     Capillary Refill: Capillary refill takes less than 2 seconds.  Neurological:     Mental Status: She is alert.     Sensory: No sensory deficit.     Comments: Patient with 0+ out of 5 strength in all 4 extremities but poor effort.  When I lift all 4 extremities she has good tone.  She is unable to move any of her extremities.  However at times when distracted it appears that she voluntarily moves all 4 of her extremities.  She does not move that much due to pain.  Sensation is intact throughout.  Pupils are equal and reactive.  She has a stuttering speech but does not appear to be consistent with aphasia or dysarthria.  Extraocular movements are normal  Psychiatric:  Mood and Affect: Mood normal.     ED Results / Procedures / Treatments   Labs (all labs ordered are listed, but only abnormal results are displayed) Labs Reviewed  CBC WITH DIFFERENTIAL/PLATELET - Abnormal; Notable for the following  components:      Result Value   Hemoglobin 9.9 (*)    HCT 30.3 (*)    MCV 73.7 (*)    MCH 24.1 (*)    RDW 21.5 (*)    All other components within normal limits  COMPREHENSIVE METABOLIC PANEL - Abnormal; Notable for the following components:   Calcium 8.4 (*)    All other components within normal limits  LIPASE, BLOOD - Abnormal; Notable for the following components:   Lipase 196 (*)    All other components within normal limits  URINE CULTURE  CSF CULTURE W GRAM STAIN  GRAM STAIN  CULTURE, FUNGUS WITHOUT SMEAR  ANAEROBIC CULTURE  RESP PANEL BY RT-PCR (FLU A&B, COVID) ARPGX2  URINALYSIS, ROUTINE W REFLEX MICROSCOPIC  CSF CELL COUNT WITH DIFFERENTIAL  CSF CELL COUNT WITH DIFFERENTIAL  PROTEIN AND GLUCOSE, CSF  VDRL, CSF  OLIGOCLONAL BANDS, CSF + SERM  DRAW EXTRA CLOT TUBE  IGG CSF INDEX  DRAW EXTRA CLOT TUBE    EKG None  Radiology CT Head Wo Contrast  Result Date: 01/21/2021 CLINICAL DATA:  Weakness. EXAM: CT HEAD WITHOUT CONTRAST TECHNIQUE: Contiguous axial images were obtained from the base of the skull through the vertex without intravenous contrast. COMPARISON:  None. FINDINGS: Brain: No evidence of acute infarction, hemorrhage, hydrocephalus, extra-axial collection or mass lesion/mass effect. Vascular: No hyperdense vessel or unexpected calcification. Skull: Normal. Negative for fracture or focal lesion. Sinuses/Orbits: No acute finding. Other: None. IMPRESSION: No acute intracranial abnormality. Electronically Signed   By: Virgina Norfolk M.D.   On: 01/21/2021 21:13   CT ABDOMEN PELVIS W CONTRAST  Result Date: 01/21/2021 CLINICAL DATA:  Epigastric pain EXAM: CT ABDOMEN AND PELVIS WITH CONTRAST TECHNIQUE: Multidetector CT imaging of the abdomen and pelvis was performed using the standard protocol following bolus administration of intravenous contrast. CONTRAST:  126mL OMNIPAQUE IOHEXOL 300 MG/ML  SOLN COMPARISON:  None. FINDINGS: Lower chest: Lung bases demonstrate no acute  consolidation or effusion. Normal cardiac size. Hepatobiliary: No calcified gallstone. No biliary dilatation. 14 mm enhancing lesion within the posterior right hepatic lobe, series 2, image 21. Pancreas: No ductal dilatation. Questionable mild fluid around the pancreas uncinate process. Spleen: Normal in size without focal abnormality. Adrenals/Urinary Tract: Adrenal glands are unremarkable. Kidneys are normal, without renal calculi, focal lesion, or hydronephrosis. Bladder is unremarkable. Stomach/Bowel: Status post gastric bypass. No dilated small bowel. No acute bowel wall thickening. Vascular/Lymphatic: No significant vascular findings are present. No enlarged abdominal or pelvic lymph nodes. Reproductive: Status post hysterectomy.  No adnexal mass Other: Negative for free air or free fluid. Musculoskeletal: No acute or significant osseous findings. IMPRESSION: 1. Questionable small amount of fluid at the uncinate process of the pancreas, suggest correlation with enzymes to exclude pancreatitis. 2. Status post gastric bypass without evidence for bowel obstruction. 3. 14 mm enhancing lesion within the posterior right hepatic lobe possibly a hemangioma though consider confirmation with nonemergent/outpatient MRI. Electronically Signed   By: Donavan Foil M.D.   On: 01/21/2021 23:28    Procedures .Lumbar Puncture  Date/Time: 01/21/2021 11:57 PM Performed by: Lennice Sites, DO Authorized by: Lennice Sites, DO   Consent:    Consent obtained:  Written   Consent given by:  Patient   Risks, benefits,  and alternatives were discussed: yes     Risks discussed:  Bleeding, headache, nerve damage, infection, pain and repeat procedure   Alternatives discussed:  No treatment Universal protocol:    Procedure explained and questions answered to patient or proxy's satisfaction: yes     Relevant documents present and verified: yes     Test results available: yes     Imaging studies available: yes     Patient  identity confirmed:  Verbally with patient, arm band, provided demographic data, hospital-assigned identification number and anonymous protocol, patient vented/unresponsive Pre-procedure details:    Procedure purpose:  Diagnostic   Preparation: Patient was prepped and draped in usual sterile fashion   Anesthesia:    Anesthesia method:  Local infiltration   Local anesthetic:  Lidocaine 1% w/o epi Procedure details:    Lumbar space:  L3-L4 interspace   Patient position:  L lateral decubitus   Needle gauge:  20   Needle type:  Spinal needle - Quincke tip   Needle length (in):  2.5   Number of attempts:  3 Post-procedure details:    Puncture site:  Adhesive bandage applied   Procedure completion:  Tolerated Comments:     Failed 3 attempts.     Medications Ordered in ED Medications  sodium chloride 0.9 % bolus 1,000 mL ( Intravenous Stopped 01/21/21 2223)  LORazepam (ATIVAN) injection 1 mg (1 mg Intravenous Given 01/21/21 2122)  iohexol (OMNIPAQUE) 300 MG/ML solution 100 mL (100 mLs Intravenous Contrast Given 01/21/21 2259)    ED Course  I have reviewed the triage vital signs and the nursing notes.  Pertinent labs & imaging results that were available during my care of the patient were reviewed by me and considered in my medical decision making (see chart for details).    MDM Rules/Calculators/A&P                          Stacy Jackson is a 40 year old female with history of seizure disorder, psoriasis, diabetes, CIDP possibly who presents to the ED with weakness.  Patient states that she developed some lower leg weakness this morning and progressed to her whole body getting weak.  She got to the point where she is not able to walk or raise her arms.  States that she was told in the past that she could have symptoms like this as she does possibly have CIDP.  This was diagnosed maybe 13 years ago.  However does not sound like she is had many episodes of the symptoms in the past.   She denies any seizure activity.  She denies getting any paralysis after seizures.  She denies any neck pain or fever.  She is having some abdominal discomfort in the epigastric region.  Denies any chest pain or shortness of breath.  Overall patient states that she is also noticed difficulty speaking and she is stuttering while talking on exam.  Neurologically patient does not give any good effort moving any of her limbs.  She does not move extremities to painful stimuli.  She appears to have good tone in all 4 of her extremities when I lift them up myself.  When I distract her it appears that she does move her limbs.  She does not have aphasic or organic sounding speech.  Overall suspect that this could be psychogenic but she does have this complex possible past neurologic process.    Upon neurology note that I reviewed from last year they  only talk about seizure disorder.  No history or mention of CIDP.  EEG at that visit was normal.  Not sure she is ever had an abnormal EEG.  She is on multiple sedating medications.  She started a new medicine recently for her psoriasis.  Overall exam is complicated and appears to be effort dependent.  I had telemetry neurologist evaluate the patient and agrees that this is likely psychogenic but will need full work-up including MRI with and without of brain and neck.  Recommend LP as well.  Would be very atypical for CIDP or Guillain-Barr.  Does not fit MS.  Not consistent with stroke. No concern for serotonin syndrome. I talked with Dr. Malen Gauze with neurology over at [facility] who agrees with the work-up.  I also proceed abdominal pain work-up.  Patient had mild elevation of lipase to 192.  Got a CT scan her abdomen pelvis that showed questionably possibly mild pancreatitis.  She is not complaining of any abdominal pain currently, no nausea or vomiting.  Overall seems like possibly mild pancreatitis.  I attempted lumbar puncture several times without success.  Will need  fluoroscopy guided lumbar puncture.  Lab work otherwise unremarkable.  No significant anemia, electrolyte abnormality.  Head CT was normal.  Overall Dr. Christy Gentles at [facility] accepts the patient in transfer for MRI of her cervical spine and brain.  Will need lumbar puncture.  Will be evaluated by neurology.  Believe that this is likely psychogenic but organic work-up needs to be proceed.  On my reevaluation she appears to be moving her extremities more so than when she previously was here as well.  Nursing staff also states that she appeared to be moving extremities when they did nasal swab for COVID.  Patient will need neurological work-up done and exclude neurologic pathology.  Not sure if she would need admission for pancreatitis as it appears fairly mild.  However if she does not have organic neurologicy pathology and unable to ambulate and walk she may need psychiatric evaluation.  Patient otherwise hemodynamically stable throughout my care.  This chart was dictated using voice recognition software.  Despite best efforts to proofread,  errors can occur which can change the documentation meaning.    Final Clinical Impression(s) / ED Diagnoses Final diagnoses:  Weakness  Elevated lipase  Acute pancreatitis, unspecified complication status, unspecified pancreatitis type    Rx / DC Orders ED Discharge Orders    None       Lennice Sites, DO 01/22/21 0004

## 2021-01-21 NOTE — ED Notes (Signed)
Patient transported to CT 

## 2021-01-21 NOTE — ED Notes (Signed)
Pt transported to CT ?

## 2021-01-21 NOTE — ED Notes (Signed)
ED Provider at bedside. 

## 2021-01-21 NOTE — Consult Note (Signed)
[facility] Tele[facility]urology Consult Services  Stat Consult  Date of Service:   01/21/2021 20:47:35  Diagnosis:     .  M62.81 - Generalized Muscle Weakness  Impression: The patient is a 40 year old woman with a history of 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, anxiety, questionable CIDP, who presents with generalized weakness. Broad differential. Advise MRI brain, C-spine, T-spine with and without contrast to look for CNS inflammatory disease. If workup negative, consider LP to look for cytoalbuminologic dissociation. Advise metabolic workup.  CT HEAD: Showed No Acute Hemorrhage or Acute Core Infarct Reviewed  Our recommendations are outlined below.   Metrics: [facility] Notification Time: 01/21/2021 20:45:46 Stamp Time: 01/21/2021 20:47:35 Callback Response Time: 01/21/2021 20:50:19   ----------------------------------------------------------------------------------------------------  Chief Complaint: weakness  History of Present Illness: Patient is a 40 year old Female.  Patient presents with generalized weakness starting at 10 AM in legs and progressing up arms over course of day. Says she has a diagnosis of CIDP since 2005 for which she has had 4-5 episodes of weakness since then, not on immunomodulators. Says she can also get weak like this after a seizure but denies having had seizure today. 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 Endorses that she is on Keppra and followed by neurology.   Past Medical History:     . 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 on Keppra, CIDP     Examination: BP(135/100), Pulse(102), Blood Glucose(100) 1A: Level of Consciousness - Alert; keenly responsive + 0 1B: Ask Month and Age - Both Questions Right + 0 1C: Blink Eyes & Squeeze Hands - Performs Both Tasks + 0 2: Test Horizontal Extraocular Movements - Normal + 0 3: Test Visual Fields - No Visual Loss + 0 4: Test  Facial Palsy (Use Grimace if Obtunded) - Normal symmetry + 0 5A: Test Left Arm Motor Drift - No Effort Against Gravity + 3 5B: Test Right Arm Motor Drift - No Effort Against Gravity + 3 6A: Test Left Leg Motor Drift - No Effort Against Gravity + 3 6B: Test Right Leg Motor Drift - No Effort Against Gravity + 3 7: Test Limb Ataxia (FNF/Heel-Shin) - No Ataxia + 0 8: Test Sensation - Mild-Moderate Loss: Less Sharp/More Dull + 1 9: Test Language/Aphasia - Normal; No aphasia + 0 10: Test Dysarthria - Mild-Moderate Dysarthria: Slurring but can be understood + 1 11: Test Extinction/Inattention - No abnormality + 0  NIHSS Score: 14     Patient / Family was informed the Neurology Consult would occur via TeleHealth consult by way of interactive audio and video telecommunications and consented to receiving care in this manner.  Patient is being evaluated for possible acute neurologic impairment and high probability of imminent or life - threatening deterioration.I spent total of 20 minutes providing care to this patient, including time for face to face visit via telemedicine, review of medical records, imaging studies and discussion of findings with providers, the patient and / or family.   Dr Serita Grammes   [facility] [phone]  Case [id number]

## 2021-01-21 NOTE — ED Notes (Signed)
PT informed of need for UA and told that staff can provide any assistance needed.

## 2021-01-21 NOTE — ED Notes (Signed)
States she was out with family today and had onset of weakness, dragging her left leg, that progressed to generalized weakness over entire body.  Additionally had difficulty speaking (dysarthria) and when she tried to text she could not remember how to spell.

## 2021-01-22 ENCOUNTER — Observation Stay (HOSPITAL_COMMUNITY): Payer: Medicaid Other

## 2021-01-22 DIAGNOSIS — I10 Essential (primary) hypertension: Secondary | ICD-10-CM

## 2021-01-22 DIAGNOSIS — R531 Weakness: Secondary | ICD-10-CM

## 2021-01-22 DIAGNOSIS — G40909 Epilepsy, unspecified, not intractable, without status epilepticus: Secondary | ICD-10-CM | POA: Diagnosis not present

## 2021-01-22 DIAGNOSIS — Z8249 Family history of ischemic heart disease and other diseases of the circulatory system: Secondary | ICD-10-CM | POA: Diagnosis not present

## 2021-01-22 DIAGNOSIS — K219 Gastro-esophageal reflux disease without esophagitis: Secondary | ICD-10-CM | POA: Diagnosis not present

## 2021-01-22 DIAGNOSIS — M6281 Muscle weakness (generalized): Secondary | ICD-10-CM | POA: Diagnosis not present

## 2021-01-22 DIAGNOSIS — Z20822 Contact with and (suspected) exposure to covid-19: Secondary | ICD-10-CM | POA: Diagnosis not present

## 2021-01-22 DIAGNOSIS — Z825 Family history of asthma and other chronic lower respiratory diseases: Secondary | ICD-10-CM | POA: Diagnosis not present

## 2021-01-22 DIAGNOSIS — D509 Iron deficiency anemia, unspecified: Secondary | ICD-10-CM | POA: Diagnosis not present

## 2021-01-22 DIAGNOSIS — F419 Anxiety disorder, unspecified: Secondary | ICD-10-CM | POA: Diagnosis not present

## 2021-01-22 DIAGNOSIS — F32A Depression, unspecified: Secondary | ICD-10-CM | POA: Diagnosis not present

## 2021-01-22 DIAGNOSIS — Z8051 Family history of malignant neoplasm of kidney: Secondary | ICD-10-CM | POA: Diagnosis not present

## 2021-01-22 DIAGNOSIS — Z833 Family history of diabetes mellitus: Secondary | ICD-10-CM | POA: Diagnosis not present

## 2021-01-22 DIAGNOSIS — G8929 Other chronic pain: Secondary | ICD-10-CM | POA: Diagnosis not present

## 2021-01-22 DIAGNOSIS — L4050 Arthropathic psoriasis, unspecified: Secondary | ICD-10-CM | POA: Diagnosis not present

## 2021-01-22 DIAGNOSIS — Z803 Family history of malignant neoplasm of breast: Secondary | ICD-10-CM | POA: Diagnosis not present

## 2021-01-22 DIAGNOSIS — Z832 Family history of diseases of the blood and blood-forming organs and certain disorders involving the immune mechanism: Secondary | ICD-10-CM | POA: Diagnosis not present

## 2021-01-22 DIAGNOSIS — Z8541 Personal history of malignant neoplasm of cervix uteri: Secondary | ICD-10-CM | POA: Diagnosis not present

## 2021-01-22 DIAGNOSIS — D571 Sickle-cell disease without crisis: Secondary | ICD-10-CM | POA: Diagnosis not present

## 2021-01-22 LAB — COMPREHENSIVE METABOLIC PANEL
ALT: 24 U/L (ref 0–44)
AST: 28 U/L (ref 15–41)
Albumin: 3.3 g/dL — ABNORMAL LOW (ref 3.5–5.0)
Alkaline Phosphatase: 60 U/L (ref 38–126)
Anion gap: 7 (ref 5–15)
BUN: 11 mg/dL (ref 6–20)
CO2: 26 mmol/L (ref 22–32)
Calcium: 8.4 mg/dL — ABNORMAL LOW (ref 8.9–10.3)
Chloride: 100 mmol/L (ref 98–111)
Creatinine, Ser: 0.72 mg/dL (ref 0.44–1.00)
GFR, Estimated: >60 mL/min (ref >60)
Glucose, Bld: 147 mg/dL — ABNORMAL HIGH (ref 70–99)
Potassium: 3.9 mmol/L (ref 3.5–5.1)
Sodium: 133 mmol/L — ABNORMAL LOW (ref 135–145)
Total Bilirubin: 0.8 mg/dL (ref 0.3–1.2)
Total Protein: 6.3 g/dL — ABNORMAL LOW (ref 6.5–8.1)

## 2021-01-22 LAB — URINALYSIS, ROUTINE W REFLEX MICROSCOPIC
Bilirubin Urine: NEGATIVE
Glucose, UA: NEGATIVE mg/dL
Hgb urine dipstick: NEGATIVE
Ketones, ur: NEGATIVE mg/dL
Leukocytes,Ua: NEGATIVE
Nitrite: NEGATIVE
Protein, ur: NEGATIVE mg/dL
Specific Gravity, Urine: >1.046 — ABNORMAL HIGH (ref 1.005–1.030)
pH: 6 (ref 5.0–8.0)

## 2021-01-22 LAB — CSF CELL COUNT WITH DIFFERENTIAL
RBC Count, CSF: 3 /mm3 — ABNORMAL HIGH
Tube #: 1
WBC, CSF: 1 /mm3 (ref 0–5)

## 2021-01-22 LAB — PHOSPHORUS: Phosphorus: 2.9 mg/dL (ref 2.5–4.6)

## 2021-01-22 LAB — RESP PANEL BY RT-PCR (FLU A&B, COVID) ARPGX2
Influenza A by PCR: NEGATIVE
Influenza B by PCR: NEGATIVE
SARS Coronavirus 2 by RT PCR: NEGATIVE

## 2021-01-22 LAB — CBC
HCT: 27.9 % — ABNORMAL LOW (ref 36.0–46.0)
Hemoglobin: 9 g/dL — ABNORMAL LOW (ref 12.0–15.0)
MCH: 24.2 pg — ABNORMAL LOW (ref 26.0–34.0)
MCHC: 32.3 g/dL (ref 30.0–36.0)
MCV: 75 fL — ABNORMAL LOW (ref 80.0–100.0)
Platelets: 229 10*3/uL (ref 150–400)
RBC: 3.72 MIL/uL — ABNORMAL LOW (ref 3.87–5.11)
RDW: 20.7 % — ABNORMAL HIGH (ref 11.5–15.5)
WBC: 3.9 10*3/uL — ABNORMAL LOW (ref 4.0–10.5)
nRBC: 0 % (ref 0.0–0.2)

## 2021-01-22 LAB — PROTEIN AND GLUCOSE, CSF
Glucose, CSF: 54 mg/dL (ref 40–70)
Total  Protein, CSF: 26 mg/dL (ref 15–45)

## 2021-01-22 LAB — GLUCOSE, CSF: Glucose, CSF: 55 mg/dL (ref 40–70)

## 2021-01-22 LAB — LIPASE, BLOOD: Lipase: 190 U/L — ABNORMAL HIGH (ref 11–51)

## 2021-01-22 LAB — MAGNESIUM: Magnesium: 2.1 mg/dL (ref 1.7–2.4)

## 2021-01-22 IMAGING — MR MR CERVICAL SPINE WO/W CM
6 of 8 series · 30 of 48 positions shown · IV contrast (Gadavist)
Comparison: None.

CLINICAL DATA: Upper and lower extremity weakness

EXAM:
MRI HEAD WITHOUT AND WITH CONTRAST
MRI CERVICAL SPINE WITHOUT AND WITH CONTRAST
TECHNIQUE: Multiplanar, multiecho pulse sequences of the brain and surrounding
structures, and cervical spine, to include the craniocervical
junction and cervicothoracic junction, were obtained without and
with intravenous contrast.
CONTRAST:  6mL GADAVIST GADOBUTROL 1 MMOL/ML IV SOLN

[Series 21: T2 · sagittal · 3.0mm · 0.69mm/px · 3 of 15 slices shown (1 of 2)]
[im 1/15]
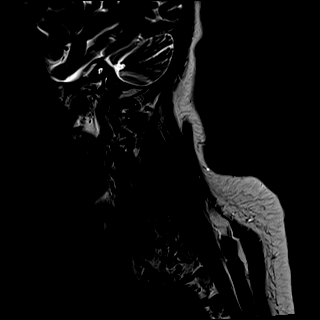
[im 8/15]
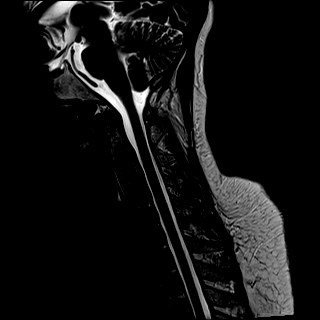
[im 15/15]
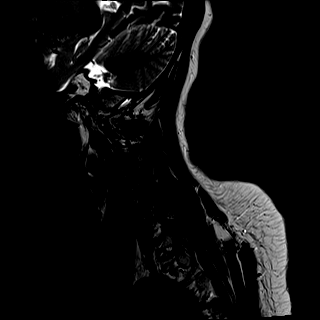

[Series 22: T1 · sagittal · 3.0mm · 0.69mm/px · 3 of 15 slices shown (1 of 2)]
[im 1/15]
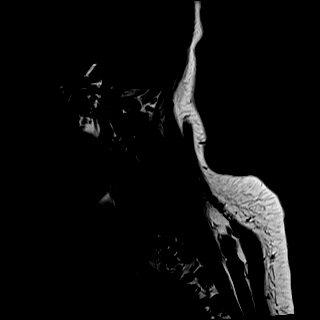
[im 8/15]
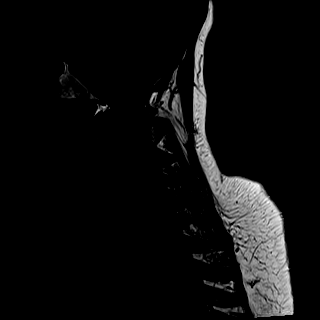
[im 15/15]
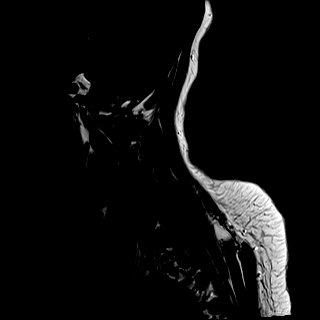

[Series 23: STIR · sagittal · 3.0mm · 0.86mm/px · 3 of 15 slices shown]
[im 1/15]
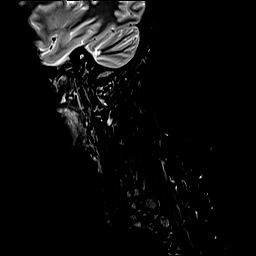
[im 8/15]
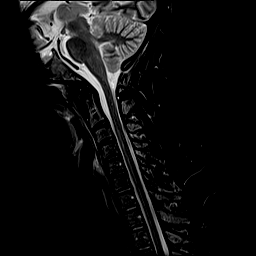
[im 15/15]
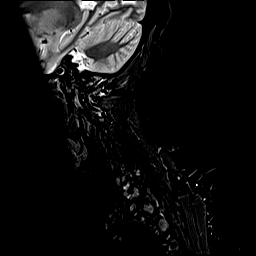

[Series 24: T2 · axial · 3.0mm · 0.66mm/px · z∈[-249,-128]mm · 9 of 39 slices shown (2 of 2)]
[im 1/39]
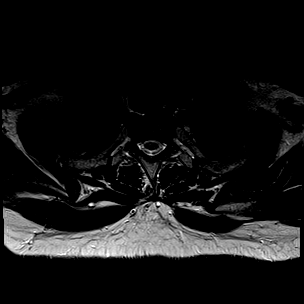
[im 5/39]
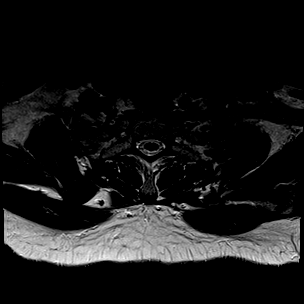
[im 10/39]
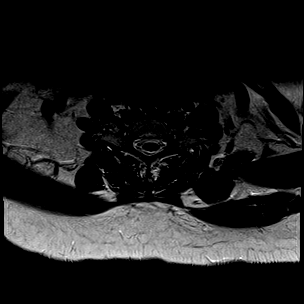
[im 15/39]
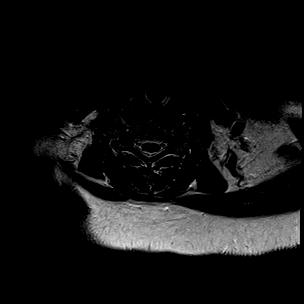
[im 20/39]
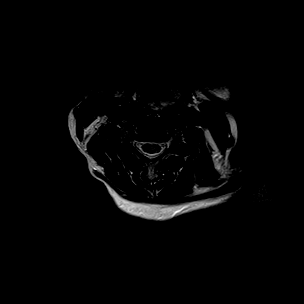
[im 24/39]
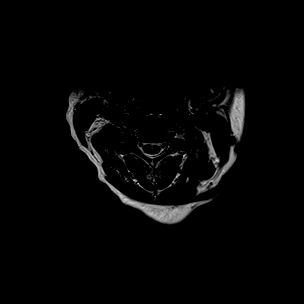
[im 29/39]
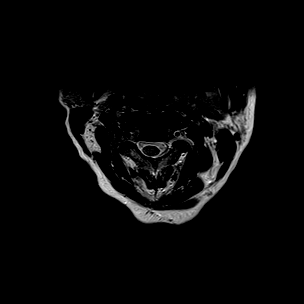
[im 34/39]
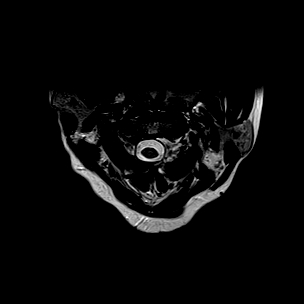
[im 39/39]
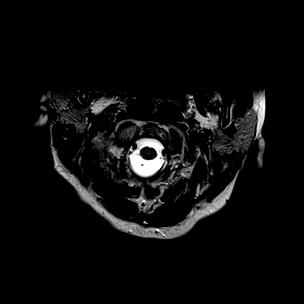

[Series 26: T1 · axial · 3.0mm · 0.39mm/px · z∈[-249,-128]mm · 9 of 40 slices shown (2 of 2)]
[im 1/40]
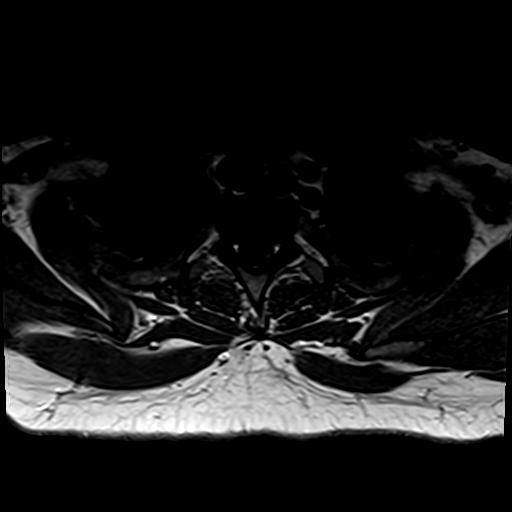
[im 5/40]
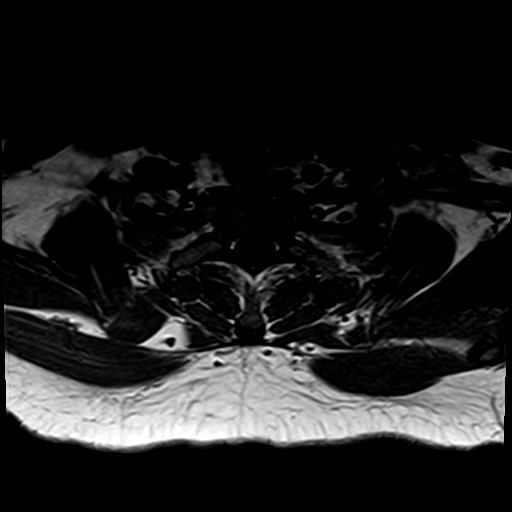
[im 10/40]
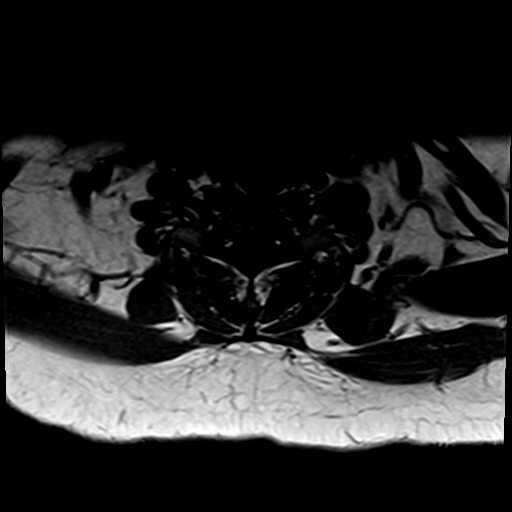
[im 15/40]
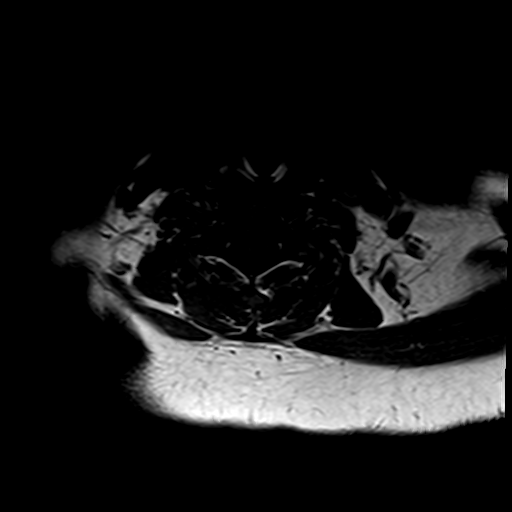
[im 20/40]
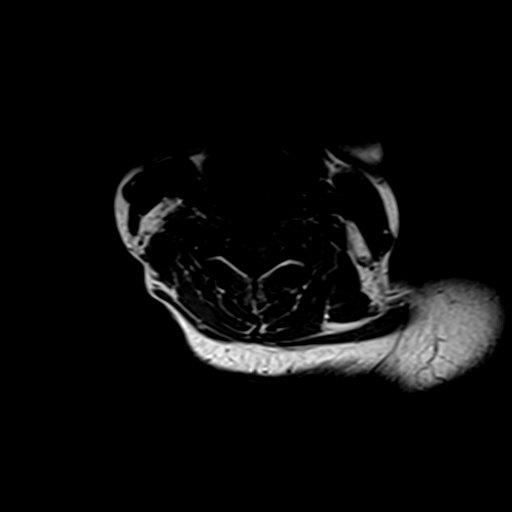
[im 25/40]
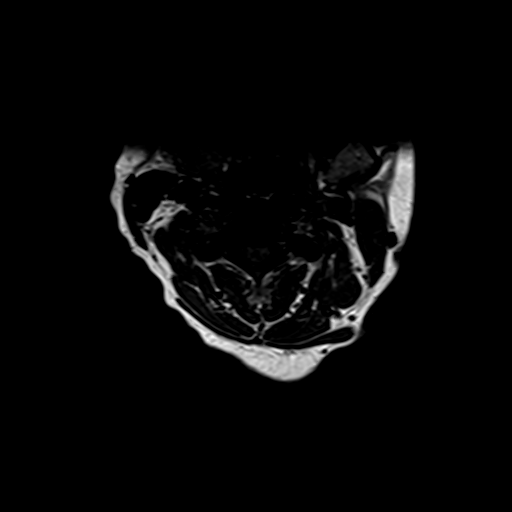
[im 30/40]
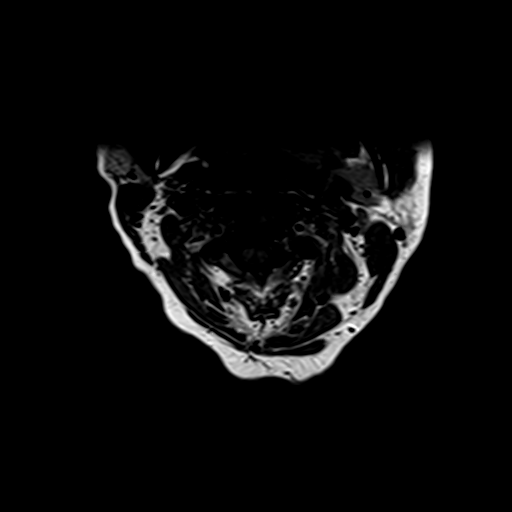
[im 35/40]
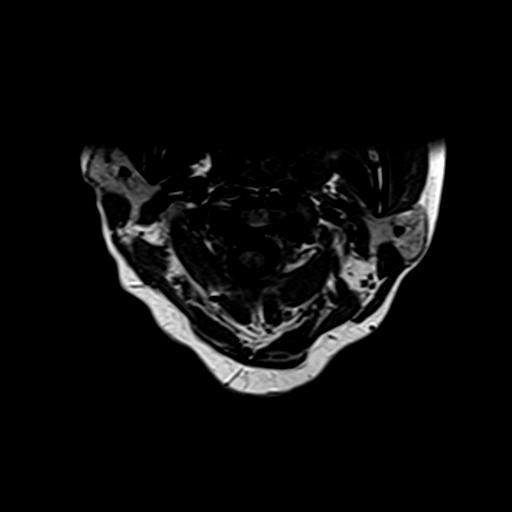
[im 40/40]
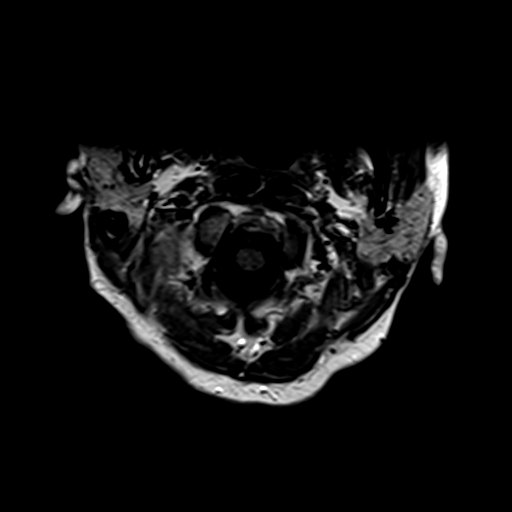

[Series 31: T1 fat-sat post-contrast · sagittal · 3.0mm · 0.43mm/px · 3 of 15 slices shown]
[im 1/15]
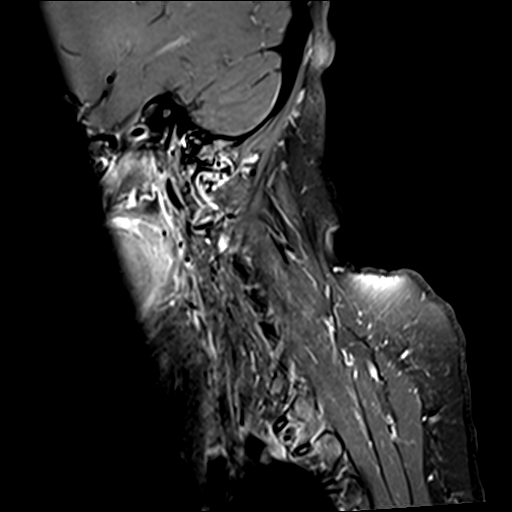
[im 8/15]
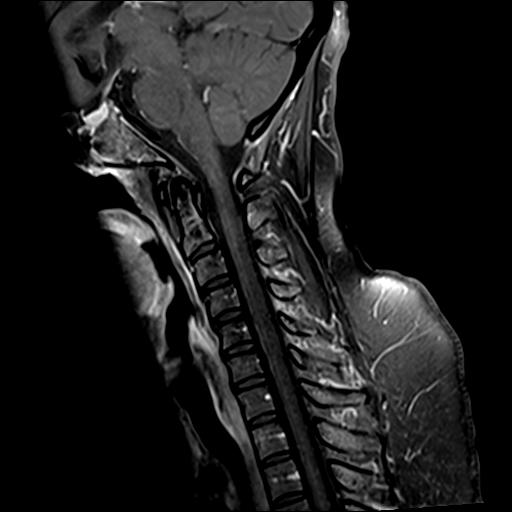
[im 15/15]
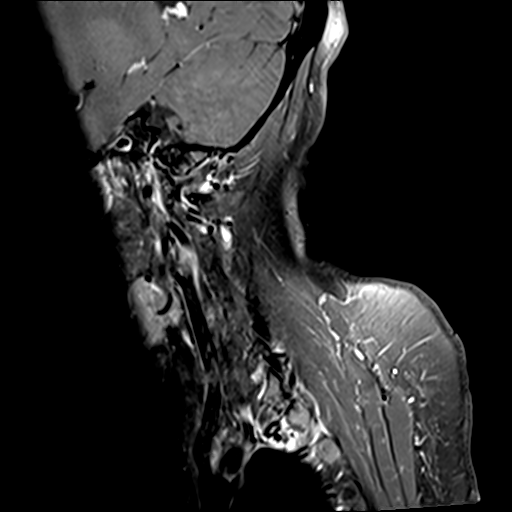

[30 of 48 positions shown; findings below may reference images not displayed]

FINDINGS: MRI HEAD FINDINGS

Brain: No acute infarct, mass effect or extra-axial collection. No
acute or chronic hemorrhage. Normal white matter signal, parenchymal
volume and CSF spaces. The midline structures are normal. There is
no abnormal contrast enhancement.

Vascular: Major flow voids are preserved.

Skull and upper cervical spine: Normal calvarium and skull base.
Visualized upper cervical spine and soft tissues are normal.

Sinuses/Orbits:No paranasal sinus fluid levels or advanced mucosal
thickening. No mastoid or middle ear effusion. Normal orbits.

MRI CERVICAL SPINE FINDINGS

Alignment: Physiologic.

Vertebrae: No fracture, evidence of discitis, or bone lesion.

Cord: Normal signal and morphology. No abnormal contrast
enhancement.

Posterior Fossa, vertebral arteries, paraspinal tissues: Negative.

Disc levels: No disc herniation or stenosis.
IMPRESSION: Normal MRI of the brain and cervical spine.

## 2021-01-22 IMAGING — RF DG SPINAL PUNCT LUMBAR DIAG WITH FL CT GUIDANCE
1 series · 1 of 1 positions shown · non-contrast
Comparison: CT abdomen pelvis [DATE]

CLINICAL DATA: Weakness since yesterday

EXAM:
DIAGNOSTIC LUMBAR PUNCTURE UNDER FLUOROSCOPIC GUIDANCE

[Series 1: cp_standard · 0.17mm/px · 1 of 1 slices shown]
[im 1/1]
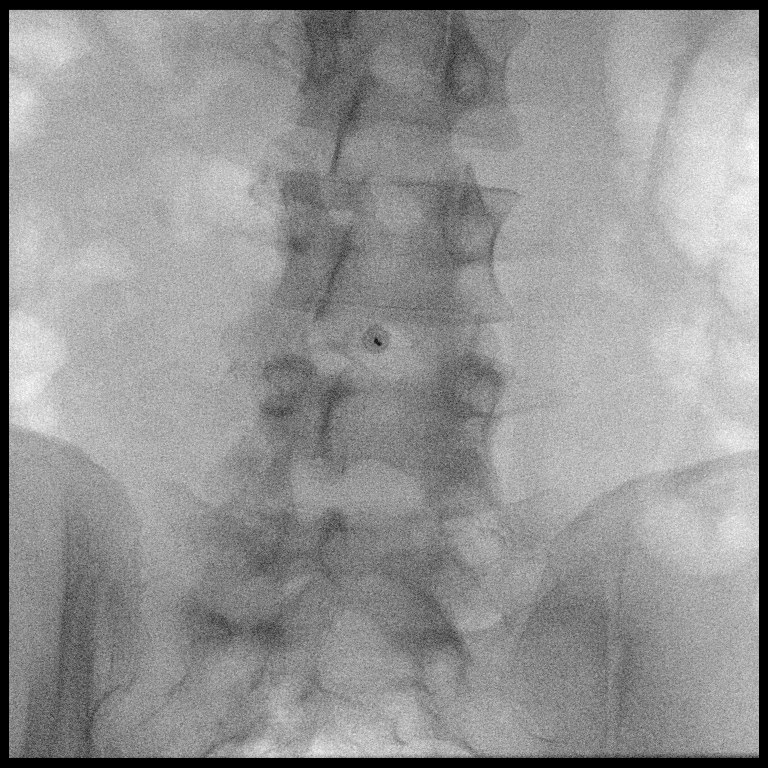

[1 of 1 positions shown; findings below may reference images not displayed]

FLUOROSCOPY TIME:  Fluoroscopy Time:  6 seconds

Radiation Exposure Index (if provided by the fluoroscopic device):
0.6 mGy

Number of Acquired Spot Images: 1

PROCEDURE:
Informed consent was obtained from the patient prior to the
procedure, including potential complications of headache, allergy,
and pain. With the patient prone, the lower back was prepped with
Betadine. 1% Lidocaine was used for local anesthesia. Lumbar
puncture was performed at the L4-L5 level using a 22 gauge needle
with return of clear CSF. Opening pressure was not obtained. 9 ml of
CSF were obtained for laboratory studies. The patient tolerated the
procedure well and there were no apparent complications.
IMPRESSION: Successful diagnostic lumbar puncture with 9 mL of clear CSF
obtained and sent to the laboratory for analysis.

## 2021-01-22 IMAGING — MR MR HEAD WO/W CM
14 of 16 series · 40 of 48 positions shown · IV contrast (gadavist)
Comparison: None.

CLINICAL DATA: Upper and lower extremity weakness

EXAM:
MRI HEAD WITHOUT AND WITH CONTRAST
MRI CERVICAL SPINE WITHOUT AND WITH CONTRAST
TECHNIQUE: Multiplanar, multiecho pulse sequences of the brain and surrounding
structures, and cervical spine, to include the craniocervical
junction and cervicothoracic junction, were obtained without and
with intravenous contrast.
CONTRAST:  6mL GADAVIST GADOBUTROL 1 MMOL/ML IV SOLN

[Series 5: DWI · axial · 3.0mm · 0.88mm/px · z∈[-143,-1]mm · 6 of 100 slices shown (1 of 4)]
[im 1/100]
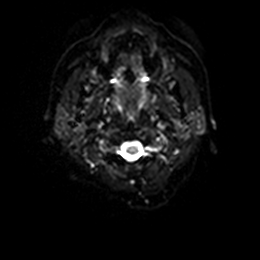
[im 20/100]
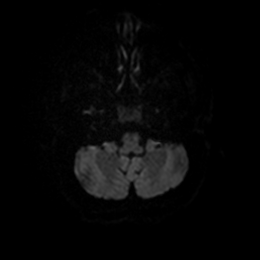
[im 40/100]
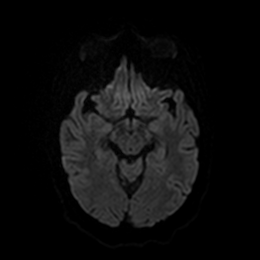
[im 60/100]
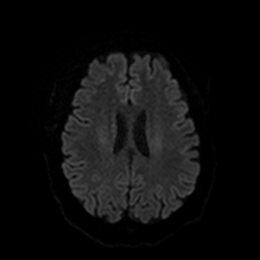
[im 80/100]
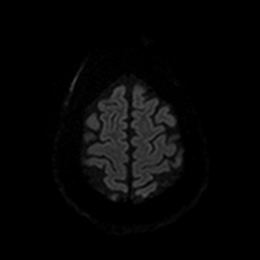
[im 100/100]
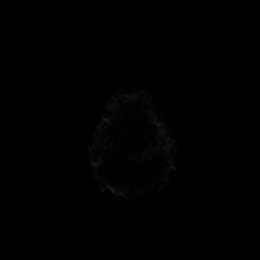

[Series 6: DWI · axial · 3.0mm · 0.88mm/px · z∈[-143,-1]mm · 2 of 50 slices shown (2 of 4)]
[im 1/50]
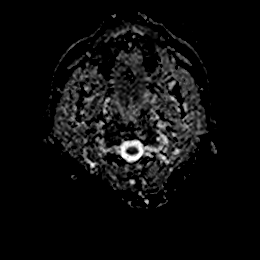
[im 50/50]
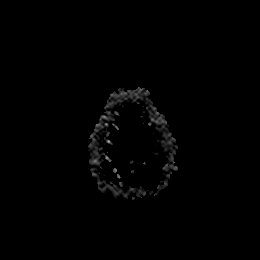

[Series 7: DWI · coronal · 4.0mm · 0.88mm/px · 4 of 68 slices shown (3 of 4)]
[im 1/68]
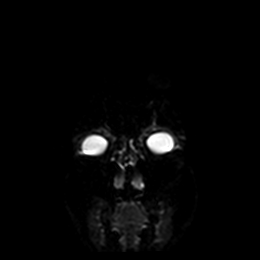
[im 23/68]
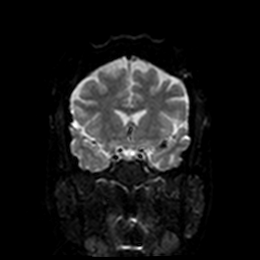
[im 45/68]
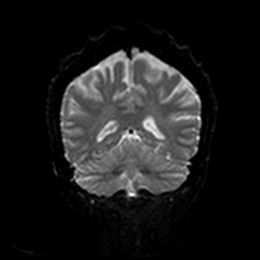
[im 68/68]
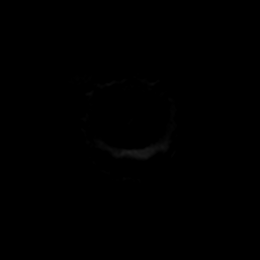

[Series 8: DWI · coronal · 4.0mm · 0.88mm/px · 2 of 34 slices shown (4 of 4)]
[im 1/34]
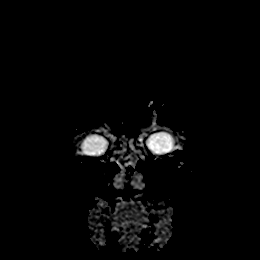
[im 34/34]
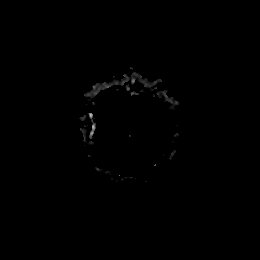

[Series 9: T1 · sagittal · 5.0mm · 0.75mm/px · 2 of 23 slices shown]
[im 1/23]
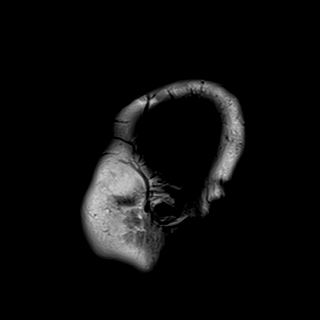
[im 23/23]
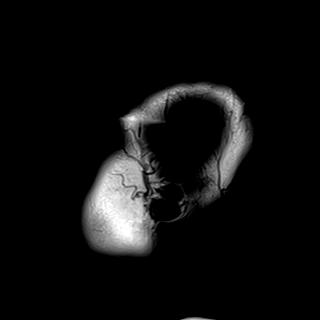

[Series 10: T2 · axial · 5.0mm · 0.72mm/px · z∈[-142,-3]mm · 2 of 25 slices shown]
[im 1/25]
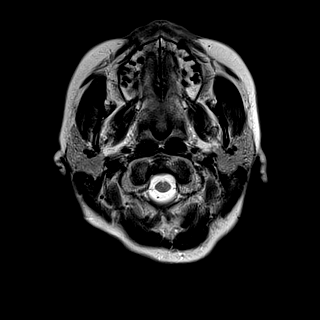
[im 25/25]
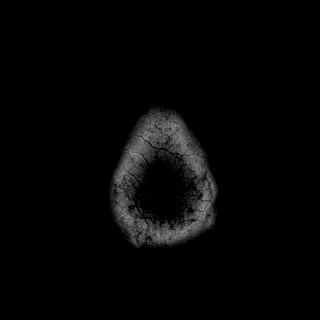

[Series 11: FLAIR · axial · 5.0mm · 0.45mm/px · z∈[-140,-1]mm · 2 of 25 slices shown]
[im 1/25]
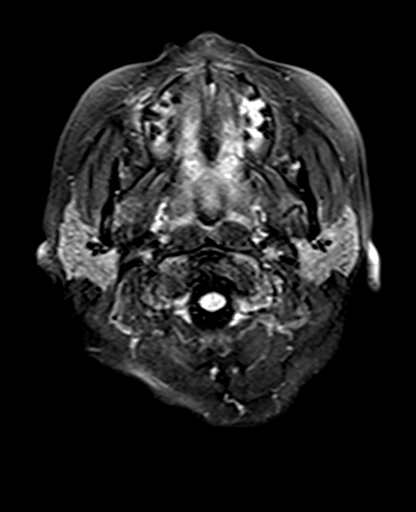
[im 25/25]
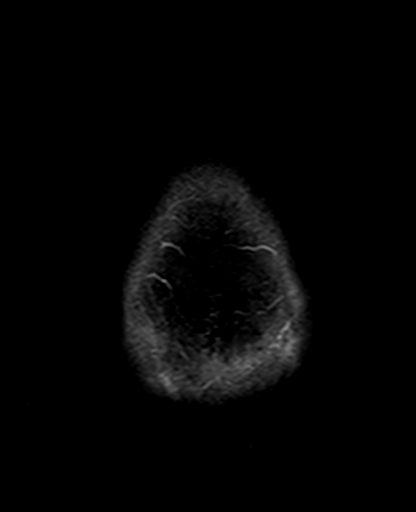

[Series 12: mag_images · axial · 3.0mm · 0.90mm/px · z∈[-144,+3]mm · 4 of 52 slices shown]
[im 1/52]
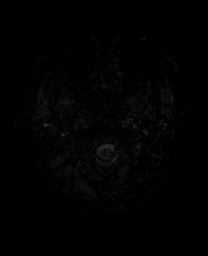
[im 18/52]
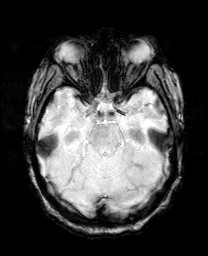
[im 35/52]
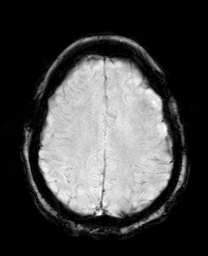
[im 52/52]
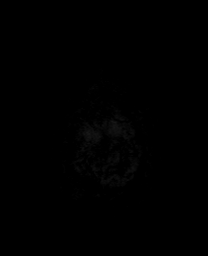

[Series 13: pha_images · axial · 3.0mm · 0.90mm/px · z∈[-142,+3]mm · 3 of 51 slices shown]
[im 1/51]
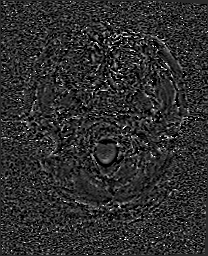
[im 26/51]
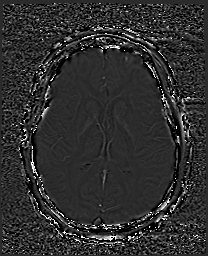
[im 51/51]
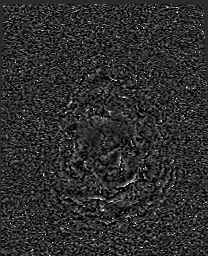

[Series 14: swi_images · axial · 3.0mm · 0.90mm/px · z∈[-144,+3]mm · 4 of 52 slices shown]
[im 1/52]
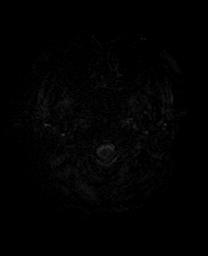
[im 18/52]
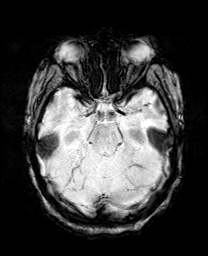
[im 35/52]
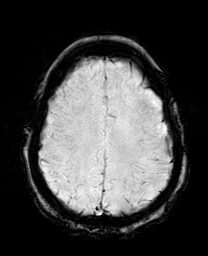
[im 52/52]
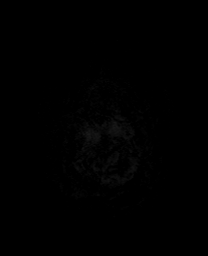

[Series 15: mip_images(sw) · axial · 24.0mm · 0.90mm/px · z∈[-134,-7]mm · 3 of 45 slices shown]
[im 1/45]
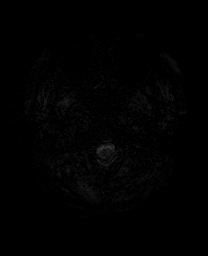
[im 23/45]
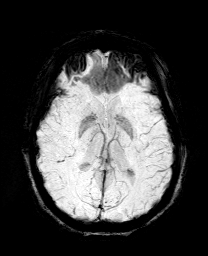
[im 45/45]
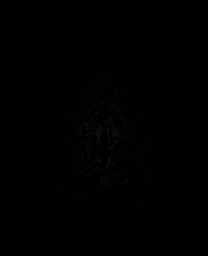

[Series 27: T2 post-contrast · coronal · 5.0mm · 0.72mm/px · 2 of 28 slices shown]
[im 1/28]
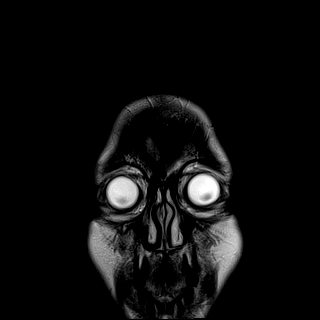
[im 28/28]
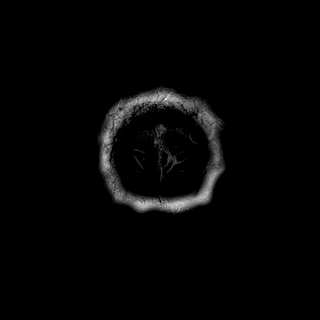

[Series 29: T1 post-contrast · coronal · 5.0mm · 0.34mm/px · 2 of 28 slices shown (1 of 2)]
[im 1/28]
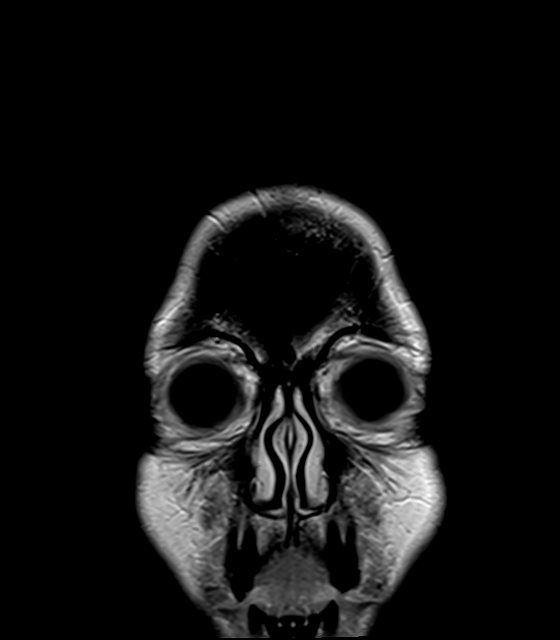
[im 28/28]
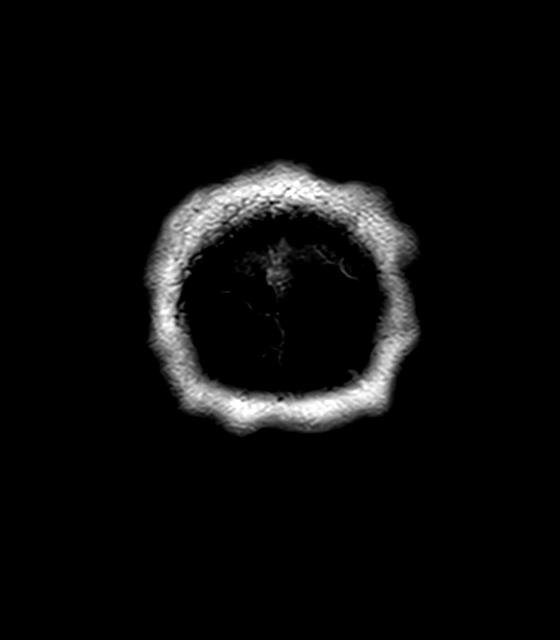

[Series 30: T1 post-contrast · sagittal · 5.0mm · 0.75mm/px · 2 of 23 slices shown (2 of 2)]
[im 1/23]
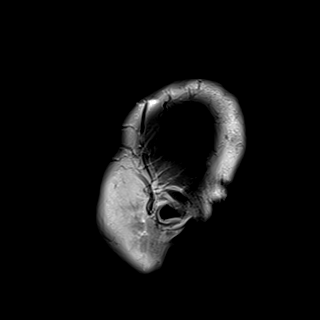
[im 23/23]
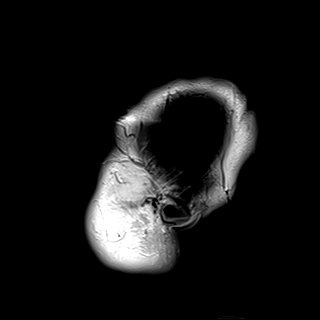

[40 of 48 positions shown; findings below may reference images not displayed]

FINDINGS: MRI HEAD FINDINGS

Brain: No acute infarct, mass effect or extra-axial collection. No
acute or chronic hemorrhage. Normal white matter signal, parenchymal
volume and CSF spaces. The midline structures are normal. There is
no abnormal contrast enhancement.

Vascular: Major flow voids are preserved.

Skull and upper cervical spine: Normal calvarium and skull base.
Visualized upper cervical spine and soft tissues are normal.

Sinuses/Orbits:No paranasal sinus fluid levels or advanced mucosal
thickening. No mastoid or middle ear effusion. Normal orbits.

MRI CERVICAL SPINE FINDINGS

Alignment: Physiologic.

Vertebrae: No fracture, evidence of discitis, or bone lesion.

Cord: Normal signal and morphology. No abnormal contrast
enhancement.

Posterior Fossa, vertebral arteries, paraspinal tissues: Negative.

Disc levels: No disc herniation or stenosis.
IMPRESSION: Normal MRI of the brain and cervical spine.

## 2021-01-22 MED ORDER — OXYCODONE HCL 5 MG PO TABS
5.0000 mg | ORAL_TABLET | Freq: Four times a day (QID) | ORAL | Status: DC | PRN
  Administered 2021-01-22 – 2021-01-24 (×5): 5 mg via ORAL
  Filled 2021-01-22 (×5): qty 1

## 2021-01-22 MED ORDER — LEVETIRACETAM 500 MG PO TABS
1000.0000 mg | ORAL_TABLET | Freq: Two times a day (BID) | ORAL | Status: DC
  Administered 2021-01-22 – 2021-01-24 (×6): 1000 mg via ORAL
  Filled 2021-01-22 (×6): qty 2

## 2021-01-22 MED ORDER — FAMOTIDINE 20 MG PO TABS
40.0000 mg | ORAL_TABLET | Freq: Every day | ORAL | Status: DC
  Administered 2021-01-22 – 2021-01-24 (×3): 40 mg via ORAL
  Filled 2021-01-22 (×4): qty 2

## 2021-01-22 MED ORDER — FENTANYL CITRATE (PF) 100 MCG/2ML IJ SOLN
50.0000 ug | Freq: Once | INTRAMUSCULAR | Status: AC
  Administered 2021-01-22: 50 ug via INTRAVENOUS
  Filled 2021-01-22: qty 2

## 2021-01-22 MED ORDER — ACETAMINOPHEN 325 MG PO TABS: 650.0000 mg | ORAL_TABLET | Freq: Four times a day (QID) | ORAL | Status: DC | PRN

## 2021-01-22 MED ORDER — GADOBUTROL 1 MMOL/ML IV SOLN
6.0000 mL | Freq: Once | INTRAVENOUS | Status: AC | PRN
  Administered 2021-01-22: 6 mL via INTRAVENOUS

## 2021-01-22 MED ORDER — SENNOSIDES-DOCUSATE SODIUM 8.6-50 MG PO TABS
2.0000 | ORAL_TABLET | Freq: Every day | ORAL | Status: DC
  Administered 2021-01-22 – 2021-01-23 (×2): 2 via ORAL
  Filled 2021-01-22 (×2): qty 2

## 2021-01-22 MED ORDER — FAMOTIDINE 20 MG PO TABS: 20.0000 mg | ORAL_TABLET | Freq: Every day | ORAL | Status: DC

## 2021-01-22 MED ORDER — VENLAFAXINE HCL ER 75 MG PO CP24
75.0000 mg | ORAL_CAPSULE | Freq: Every day | ORAL | Status: DC
  Administered 2021-01-22 – 2021-01-24 (×3): 75 mg via ORAL
  Filled 2021-01-22 (×4): qty 1

## 2021-01-22 MED ORDER — FERROUS SULFATE 325 (65 FE) MG PO TABS
325.0000 mg | ORAL_TABLET | Freq: Every day | ORAL | Status: DC
  Administered 2021-01-22 – 2021-01-24 (×3): 325 mg via ORAL
  Filled 2021-01-22 (×3): qty 1

## 2021-01-22 MED ORDER — MORPHINE SULFATE (PF) 2 MG/ML IV SOLN
2.0000 mg | INTRAVENOUS | Status: DC | PRN
  Administered 2021-01-22: 2 mg via INTRAVENOUS
  Filled 2021-01-22 (×2): qty 1

## 2021-01-22 MED ORDER — LACTATED RINGERS IV SOLN: INTRAVENOUS | Status: AC

## 2021-01-22 MED ORDER — LIDOCAINE HCL (PF) 1 % IJ SOLN
5.0000 mL | Freq: Once | INTRAMUSCULAR | Status: AC
  Administered 2021-01-22: 1.5 mL via INTRADERMAL

## 2021-01-22 MED ORDER — METOPROLOL SUCCINATE ER 25 MG PO TB24
12.5000 mg | ORAL_TABLET | Freq: Every day | ORAL | Status: DC
  Administered 2021-01-22 – 2021-01-24 (×3): 12.5 mg via ORAL
  Filled 2021-01-22 (×3): qty 1

## 2021-01-22 NOTE — ED Notes (Signed)
Requisitions for all the CSF tests that were ordered from the LP done in xray were tubed to lab

## 2021-01-22 NOTE — ED Notes (Signed)
Pt lunch tray ordered.

## 2021-01-22 NOTE — ED Notes (Signed)
In xray for LP

## 2021-01-22 NOTE — Evaluation (Signed)
Physical Therapy Evaluation Patient Details Name: Stacy Jackson MRN: [id number] DOB: 1981-01-23 Today's Date: 01/22/2021   History of Present Illness  Pt is a 40 y/o female admitted secondary to sudden onset difficulty ambulating and weakness. MRI negative. Pt is s/p lumbar puncture on 5/5 and awaiting results. PMH includes CIDP in 2005, headaches and complex migraine, diabetes controlled with diet, history of seizures.  Clinical Impression  Pt presenting with problem above and deficits below. Pt requiring min A for transfers and taking side steps at EOB. Noted increased weakness in BUE and BLE (L extremities>R extremities) and tremors noted as well. Pt does report strength has improved since admission. Will need to progress mobility to determine most appropriate d/c recommendations, however, pt hoping to progress to outpatient PT at d/c. Pt reporting she would like to be able to write again as she is L handed. Will continue to follow acutely.      Follow Up Recommendations Other (comment) (TBD pending progression; hoping to progress to OP PT)    Equipment Recommendations  Other (comment) (TBD pending progression)    Recommendations for Other Services       Precautions / Restrictions Precautions Precautions: Fall Restrictions Weight Bearing Restrictions: No      Mobility  Bed Mobility Overal bed mobility: Needs Assistance Bed Mobility: Supine to Sit;Sit to Supine     Supine to sit: Min guard Sit to supine: Min assist   General bed mobility comments: Min A to help BLE back onto stretcher.    Transfers Overall transfer level: Needs assistance Equipment used: 1 person hand held assist Transfers: Sit to/from Stand Sit to Stand: Min assist         General transfer comment: Min A for lift assist and steadying assist.  Ambulation/Gait Ambulation/Gait assistance: Min assist   Assistive device: None       General Gait Details: took side steps at EOB this  session. Min A for steadying as BLE with increased tremors.  Stairs            Wheelchair Mobility    Modified Rankin (Stroke Patients Only)       Balance Overall balance assessment: Needs assistance Sitting-balance support: No upper extremity supported;Feet supported Sitting balance-Leahy Scale: Fair     Standing balance support: No upper extremity supported;During functional activity Standing balance-Leahy Scale: Poor Standing balance comment: Reliant on external support                             Pertinent Vitals/Pain Pain Assessment: 0-10 Pain Score: 7  Pain Location: legs and back Pain Descriptors / Indicators: Aching Pain Intervention(s): Limited activity within patient's tolerance;Monitored during session;Repositioned    Home Living Family/patient expects to be discharged to:: Private residence Living Arrangements: Children (14 y/o daughter) Available Help at Discharge: Family Type of Home: House Home Access: Stairs to enter Entrance Stairs-Rails: Left Entrance Stairs-Number of Steps: 3 Home Layout: One level Home Equipment: Shower seat      Prior Function Level of Independence: Independent               Hand Dominance        Extremity/Trunk Assessment   Upper Extremity Assessment Upper Extremity Assessment: Defer to OT evaluation (weakness throughout LUE>LLE; tremors noted in LUE and decreased coordination during finger to nose test.)    Lower Extremity Assessment Lower Extremity Assessment: RLE deficits/detail;LLE deficits/detail RLE Deficits / Details: Grossly 3+/5 throughout. Increased tremors noted in  LLE. LLE Deficits / Details: Grossly 3+/5 throughout. Increased tremors noted in LLE.    Cervical / Trunk Assessment Cervical / Trunk Assessment: Normal  Communication   Communication: No difficulties  Cognition Arousal/Alertness: Awake/alert Behavior During Therapy: WFL for tasks assessed/performed Overall Cognitive  Status: Within Functional Limits for tasks assessed                                        General Comments General comments (skin integrity, edema, etc.): Pt's mom present during session    Exercises     Assessment/Plan    PT Assessment Patient needs continued PT services  PT Problem List Decreased strength;Decreased balance;Decreased mobility;Decreased activity tolerance       PT Treatment Interventions Gait training;DME instruction;Functional mobility training;Therapeutic activities;Therapeutic exercise;Balance training;Patient/family education    PT Goals (Current goals can be found in the Care Plan section)  Acute Rehab PT Goals Patient Stated Goal: to go home PT Goal Formulation: With patient Time For Goal Achievement: 02/05/21 Potential to Achieve Goals: Good    Frequency Min 3X/week   Barriers to discharge        Co-evaluation               AM-PAC PT "6 Clicks" Mobility  Outcome Measure Help needed turning from your back to your side while in a flat bed without using bedrails?: A Little Help needed moving from lying on your back to sitting on the side of a flat bed without using bedrails?: A Little Help needed moving to and from a bed to a chair (including a wheelchair)?: A Little Help needed standing up from a chair using your arms (e.g., wheelchair or bedside chair)?: A Little Help needed to walk in hospital room?: A Little Help needed climbing 3-5 steps with a railing? : A Lot 6 Click Score: 17    End of Session Equipment Utilized During Treatment: Gait belt Activity Tolerance: Patient tolerated treatment well Patient left: in bed;with call bell/phone within reach;with family/visitor present (on stretcher in ED) Nurse Communication: Mobility status PT Visit Diagnosis: Unsteadiness on feet (R26.81);Muscle weakness (generalized) (M62.81);Difficulty in walking, not elsewhere classified (R26.2)    Time: 1411-1430 PT Time Calculation  (min) (ACUTE ONLY): 19 min   Charges:   PT Evaluation $PT Eval Moderate Complexity: 1 Mod          Reuel Derby, PT, DPT  Acute Rehabilitation Services  Pager: [phone] Office: [phone]   Rudean Hitt 01/22/2021, 4:34 PM

## 2021-01-22 NOTE — ED Notes (Signed)
Patient transported to MRI 

## 2021-01-22 NOTE — ED Notes (Signed)
Breakfast Ordered 

## 2021-01-22 NOTE — Procedures (Signed)
Diagnostic fluoro guided lumbar puncture was performed with local anesthesia.  9 mL of clear CSF was obtained and sent to the lab for analysis  The patient tolerated the procedure well and there were no complications.

## 2021-01-22 NOTE — ED Notes (Signed)
Returned from [facility]

## 2021-01-22 NOTE — ED Notes (Signed)
[facility] Pt is here for an mri. [facility] reports pt reports weakness starting yesterday, primarily on the left side. They report her NIHSS have been 0. Pt has no hx of strokes. They report vitals have been stable

## 2021-01-22 NOTE — H&P (Signed)
History and Physical  Stacy Jackson MWN:[id number] DOB: [birth year] DOA: 01/21/2021  Referring physician: Dr. Christy Gentles, [place] PCP: Mack Hook, MD  Outpatient Specialists: Cardiology, PMR, neurology. Patient coming from: Home.  Chief Complaint: Generalized weakness.  HPI: Stacy Jackson is a 40 y.o. female with medical history significant for CIDP, seizure disorder, iron deficiency anemia, essential hypertension, polyneuropathy, GERD, chronic anxiety/depression, psoriatic arthritis, who initially presented to [facility] ED due to sudden onset generalized weakness associated with difficulty walking.  An LP was attempted in the ED at [facility] unsuccessfully.  Seen by neurology with recommendation for MRI brain with and without contrast and MRI C-spine with and without contrast.  Also recommended to obtain lumbar puncture under fluoroscopy.  No sick contacts, fevers or chills.  Reports chronic epigastric pain with mild nausea and no vomiting.  Takes 60 mg Famotidine prescribed by PCP.  She was transferred to [facility] ED for further evaluation and management.  TRH, hospitalist team, was asked to admit.  ED Course:  Afebrile.  BP 144/106, pulse 98, respiratory rate 19, O2 saturation 96-100% on room air.  Lab studies remarkable for lipase 196.  Hemoglobin 9.9, MCV 73.7.  Review of Systems: Review of systems as noted in the HPI. All other systems reviewed and are negative.   Past Medical History:  Diagnosis Date  . Abdominal pain, chronic, right lower quadrant   . Cervical cancer ([place]) 2015   Unsuccessful LEEP, then TAH  . 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 Complicated migraine 4034   Left arm numbness, states she may have had a "mini-stroke"  . Diabetes mellitus without complication ([place])   . Diverticulosis 02/15/2017  . GERD (gastroesophageal reflux disease)    with hiatal hernia.   fundoplication in 7425  . History of obesity 2010  . Ovarian cyst right   2008  . 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 Seizures ([place])   . Sickle cell anemia ([facility])   . Stroke [facility])    Past Surgical History:  Procedure Laterality Date  . CESAREAN SECTION  2003, 2008   2nd with left oophorectomy  . COLONOSCOPY WITH PROPOFOL N/A 11/22/2016   Procedure: COLONOSCOPY WITH PROPOFOL;  Surgeon: Arta Silence, MD;  Location: [facility] ENDOSCOPY;  Service: Endoscopy;  Laterality: N/A;  . ESOPHAGOGASTRODUODENOSCOPY  01/13/2015   Normal biopsy of gastric mucosa/gastric pouch  . NISSEN FUNDOPLICATION  9563  . OOPHORECTOMY Left 2008   Done at same time of Csection  . ROUX-EN-Y GASTRIC BYPASS  ?with Nissen fundoplication   Unknown year--found in GI records from [facility] as bariatric procedure.  Marland Kitchen VAGINAL HYSTERECTOMY  2015   For cervical cancer unresponsive to LEEP    Social History:  reports that she has never smoked. She has never used smokeless tobacco. She reports that she does not drink alcohol and does not use drugs.   Allergies  Allergen Reactions  . Shrimp [Shellfish Allergy] Anaphylaxis    Family History  Problem Relation Age of Onset  . Renal cancer Mother   . Hypertension Mother   . Ulcerative colitis Father   . Diabetes Father   . Hypertension Father   . Diabetes Brother   . Hypertension Brother   . Asthma Brother   . Sickle cell trait Daughter   . Eczema Son   . Autism spectrum disorder Son   . Asthma Brother   . Eczema Brother   .  Breast cancer Maternal Grandmother        diagnosed late 60s and died mid 68s.      Prior to Admission medications   Medication Sig Start Date End Date Taking? Authorizing Provider  busPIRone (BUSPAR) 5 MG tablet Take 1 tablet (5 mg total) by mouth 3 (three) times daily. 08/29/20   Mack Hook, MD  clobetasol ointment (TEMOVATE) 0.05 % Apply to affected areas twice daily as needed 01/28/20   Mack Hook, MD  COSENTYX SENSOREADY, 300 MG, 150 MG/ML SOAJ Inject into the skin. 10/28/20   [provider]  cyanocobalamin 100 MCG tablet Take 1,000 mcg by mouth daily.    [provider]  dicyclomine (BENTYL) 10 MG capsule 1 cap by mouth every 8 hours as needed 04/29/20   Mack Hook, MD  EPINEPHrine (EPIPEN 2-PAK) 0.3 mg/0.3 mL IJ SOAJ injection Inject 0.3 mg into the muscle as needed for anaphylaxis. 10/29/20   Mack Hook, MD  famotidine (PEPCID) 20 MG tablet 3 tablets by mouth 1 hour before breakfast 04/18/20   Mack Hook, MD  fexofenadine [facility]) 180 MG tablet 1 tab by mouth daily as needed 11/22/17   Mack Hook, MD  gabapentin (NEURONTIN) 400 MG capsule TAKE 1 CAPSULE BY MOUTH THREE TIMES DAILY 04/18/20   Mack Hook, MD  Iron Polysacch Cmplx-B12-FA 150-0.025-1 MG CAPS 1 cap by mouth daily 08/29/20   Mack Hook, MD  levETIRAcetam (KEPPRA) 1000 MG tablet Take 1 tablet by mouth twice daily 12/31/20   Mack Hook, MD  meloxicam (MOBIC) 15 MG tablet TAKE 1 TABLET BY MOUTH DAILY 06/09/18   Mack Hook, MD  methotrexate (RHEUMATREX) 2.5 MG tablet Take 15 mg (6 tabs) weekly. Can take 3 tabs on Sunday and 3 tabs on Monday. 01/15/20   [provider]  metoprolol succinate (TOPROL-XL) 25 MG 24 hr tablet Take 2 tablets (50 mg total) by mouth daily. Take with or immediately following a meal. 01/28/20   Mack Hook, MD  naproxen sodium (ALEVE) 220 MG tablet Take 220 mg by mouth daily as needed (pain).    [provider]  tiZANidine (ZANAFLEX) 4 MG tablet TAKE 1/2 TO 1 (ONE-HALF TO ONE) TABLET BY MOUTH NIGHTLY AS NEEDED MAY  REPEAT  1  TIME 01/08/19   [provider]  venlafaxine XR (EFFEXOR XR) 75 MG 24 hr capsule Take 1 capsule (75 mg total) by mouth daily with breakfast. 10/29/20   Mack Hook, MD    Physical Exam: BP (!) 139/96   Pulse 95   Temp 98.4 F (36.9 C) (Oral)   Resp 19    Ht 4\' 11"  (1.499 m)   Wt 61 kg   LMP 12/14/2012   SpO2 100%   BMI 27.15 kg/m   . General: 40 y.o. year-old female well developed well nourished in no acute distress.  Alert and oriented x3. . Cardiovascular: Regular rate and rhythm with no rubs or gallops.  No thyromegaly or JVD noted.  No lower extremity edema. 2/4 pulses in all 4 extremities. Marland Kitchen Respiratory: Clear to auscultation with no wheezes or rales. Good inspiratory effort. . Abdomen: Soft, epigastric, RU and R mid Q tenderness with palpation.  Nondistended with normal bowel sounds x4 quadrants. . Muskuloskeletal: No cyanosis, clubbing or edema noted bilaterally . Neuro: CN II-XII intact, strength, sensation, reflexes.  Can raise her lower extremities with difficulty but equally. . Skin: No ulcerative lesions noted or rashes . Psychiatry: Judgement and insight appear normal. Mood is appropriate for condition and  setting          Labs on Admission:  Basic Metabolic Panel: Recent Labs  Lab 01/21/21 2111  NA 139  K 4.0  CL 102  CO2 24  GLUCOSE 93  BUN 10  CREATININE 0.57  CALCIUM 8.4*   Liver Function Tests: Recent Labs  Lab 01/21/21 2111  AST 30  ALT 21  ALKPHOS 56  BILITOT 0.3  PROT 6.7  ALBUMIN 3.7   Recent Labs  Lab 01/21/21 2111  LIPASE 196*   No results for input(s): AMMONIA in the last 168 hours. CBC: Recent Labs  Lab 01/21/21 2111  WBC 4.8  NEUTROABS 2.7  HGB 9.9*  HCT 30.3*  MCV 73.7*  PLT 261   Cardiac Enzymes: No results for input(s): CKTOTAL, CKMB, CKMBINDEX, TROPONINI in the last 168 hours.  BNP (last 3 results) Recent Labs    12/16/20 0000  BNP 22.2    ProBNP (last 3 results) No results for input(s): PROBNP in the last 8760 hours.  CBG: No results for input(s): GLUCAP in the last 168 hours.  Radiological Exams on Admission: CT Head Wo Contrast  Result Date: 01/21/2021 CLINICAL DATA:  Weakness. EXAM: CT HEAD WITHOUT CONTRAST TECHNIQUE: Contiguous axial images were obtained  from the base of the skull through the vertex without intravenous contrast. COMPARISON:  None. FINDINGS: Brain: No evidence of acute infarction, hemorrhage, hydrocephalus, extra-axial collection or mass lesion/mass effect. Vascular: No hyperdense vessel or unexpected calcification. Skull: Normal. Negative for fracture or focal lesion. Sinuses/Orbits: No acute finding. Other: None. IMPRESSION: No acute intracranial abnormality. Electronically Signed   By: Virgina Norfolk M.D.   On: 01/21/2021 21:13   CT ABDOMEN PELVIS W CONTRAST  Result Date: 01/21/2021 CLINICAL DATA:  Epigastric pain EXAM: CT ABDOMEN AND PELVIS WITH CONTRAST TECHNIQUE: Multidetector CT imaging of the abdomen and pelvis was performed using the standard protocol following bolus administration of intravenous contrast. CONTRAST:  137mL OMNIPAQUE IOHEXOL 300 MG/ML  SOLN COMPARISON:  None. FINDINGS: Lower chest: Lung bases demonstrate no acute consolidation or effusion. Normal cardiac size. Hepatobiliary: No calcified gallstone. No biliary dilatation. 14 mm enhancing lesion within the posterior right hepatic lobe, series 2, image 21. Pancreas: No ductal dilatation. Questionable mild fluid around the pancreas uncinate process. Spleen: Normal in size without focal abnormality. Adrenals/Urinary Tract: Adrenal glands are unremarkable. Kidneys are normal, without renal calculi, focal lesion, or hydronephrosis. Bladder is unremarkable. Stomach/Bowel: Status post gastric bypass. No dilated small bowel. No acute bowel wall thickening. Vascular/Lymphatic: No significant vascular findings are present. No enlarged abdominal or pelvic lymph nodes. Reproductive: Status post hysterectomy.  No adnexal mass Other: Negative for free air or free fluid. Musculoskeletal: No acute or significant osseous findings. IMPRESSION: 1. Questionable small amount of fluid at the uncinate process of the pancreas, suggest correlation with enzymes to exclude pancreatitis. 2. Status  post gastric bypass without evidence for bowel obstruction. 3. 14 mm enhancing lesion within the posterior right hepatic lobe possibly a hemangioma though consider confirmation with nonemergent/outpatient MRI. Electronically Signed   By: Donavan Foil M.D.   On: 01/21/2021 23:28    EKG: I independently viewed the EKG done and my findings are as followed: Sinus tachycardia rate of 100.  Nonspecific ST-T changes.  QTc 482.  Assessment/Plan Present on Admission: **None**  Active Problems:   Generalized weakness  Generalized weakness, unclear etiology. Sudden onset, no clear evidence of active infective process Seen by neurology with recommendation for fluoroscopy guided lumbar puncture. Afebrile no leukocytosis Work-up thus  far unrevealing CT head nonacute MRI brain with and without contrast, MRI C-spine with and without contrast ordered and pending. PT OT to assess once stable. Fall precautions R/o Guillain-Barr syndrome versus cervical spinal cord pathology versus functional disorder. Appreciate neurology's assistance. Management per neurology  Possible mild pancreatitis Seen on CT scan CT abdomen pelvis with contrast done on 01/21/2021 showed questionable small amount of fluid at the uncinate process of the pancreas, suggest correlation with enzymes to exclude acute pancreatitis. No calcified gallstone no biliary dilatation seen on CT scan. Presented with lipase level 196. Alkaline phosphatase, AST, ALT and T bili normal Repeat lipase level in the morning Continue IV fluid hydration LR 100 cc/h.  Seizure disorder Resume home Keppra Seizure precautions  Chronic microcytic anemia, history of iron deficiency anemia Hemoglobin 9.9 with MCV of 73, appears to be at her baseline. No overt bleeding Monitor H&H Resume iron supplement. No prior history of EGD.   Has history of colonoscopy in 2018 which showed internal hemorrhoids.  Essential hypertension BP is not at goal Resume  home Toprol-XL Monitor vital signs.  Chronic anxiety/depression Resume home Effexor.  GERD Resume home famotidine. At home was taking 60 mg of famotidine daily Restarted famotidine at 40 mg daily.      DVT prophylaxis: SCDs due to anticipated LP   Code Status: Full code  Family Communication: Significant other at bedside.  Disposition Plan: Admit to telemetry medical.  Consults called: Neurology.  Admission status: Observation status.   Status is: Observation    Dispo: The patient is from: Home.              Anticipated d/c is to: Home, once symptomatology has improved.              Patient currently not stable for DC due to persistent symptomatology, possible with pancreatitis.    Difficult to place patient, not applicable.       Kayleen Memos MD [facility] Pager [phone]  If 7PM-7AM, please contact night-coverage [email or web address] Password [email or web address]  01/22/2021, 1:59 AM

## 2021-01-22 NOTE — ED Provider Notes (Signed)
I assumed care of patient after transfer from [place].  Patient is awake and alert but gives very minimal effort with moving extremities.  Discussed with neurology Dr. Rory Percy who recommends medical admission to complete the work-up. Discussed with [facility] hospitalist for admission   Ripley Fraise, MD 01/22/21 [phone]

## 2021-01-22 NOTE — ED Notes (Signed)
Pt still in mri 

## 2021-01-22 NOTE — ED Notes (Signed)
Report called to RN receiving pt in 2W22.

## 2021-01-22 NOTE — Progress Notes (Signed)
PT Cancellation Note  Patient Details Name: Stacy Jackson MRN: [id number] DOB: 17-Aug-1981   Cancelled Treatment:    Reason Eval/Treat Not Completed: Medical issues which prohibited therapy Pt had LP and needs to lay flat for 4 hours. Will follow up as pt medically appropriate and as schedule allows.    Reuel Derby, PT, DPT  Acute Rehabilitation Services  Pager: [phone] Office: [phone]    Rudean Hitt 01/22/2021, 10:14 AM

## 2021-01-22 NOTE — Progress Notes (Signed)
Patient ID: Stacy Jackson, female   DOB: April 11, 1981, 40 y.o.   MRN: [id number] Patient admitted early this morning for generalized weakness of unclear etiology and neurology was consulted.  MRI of brain and cervical spine was unremarkable.  Patient seen and examined at bedside and plan of care discussed with her.  I have reviewed patient's medical records including this morning's H&P, vitals, labs and medications myself.  Follow further neurology recommendations.

## 2021-01-22 NOTE — ED Notes (Signed)
Patient transported to X-ray 

## 2021-01-22 NOTE — Consult Note (Signed)
Neurology Consultation  Reason for Consult: Weakness Referring Physician: Dr. Lennice Sites  CC: Sudden onset of generalized weakness  History is obtained from: Chart review, patient  HPI: Stacy Jackson is a 40 y.o. female past medical history of CIDP in 2005, headaches and complex migraine, diabetes controlled with diet, history of seizures on Keppra-LTM studies at [facility] normal and given diagnosis of presumptive PNES based on his description of events,  with most recent seizure reported in December 2021, psoriasis and psoriatic arthritis on Tremfya, presenting to the emergency [place] with complaints of sudden onset of difficulty walking and generalized weakness.  She reports that she was normal sometime this morning, doing chores with her daughter when she started dragging her left leg some and over a period of a few hours, became so weak that she could not walk.  She also had weakness in both her upper extremities as well. Denies any tingling or numbness but reports left side worse than right.  Reports that her speech is different-she is having a hard time putting words together and her speech is "choppy"  Reports that she had CIDP in 2005 was on treatment-infusions and plasma exchange for a while and then she moved states and did not require any infusions or plasma exchanges.  Reports having history of seizures that started many years ago, was on valproate at 1 point, moved to [place] from [place] in 2015 and stopped valproate.  In 2018 had seizure-like activity in church, saw Dr. Leta Baptist was started on Keppra, referred to [facility] EMU-shaking bilaterally with no loss of consciousness and trembling for 25 to 30 minutes with confusion that lasted for about an hour along with difficulty thinking words.  Her daughter at bedside confirmed that during her episodes of seizures its whole body shaking with her eyes closed and not verbal but  responding to questions and can hear sounds but feels like she is disconnected to things.  Denies preceding sickness illness fevers chills.  Reports some shortness of breath that has improved over the last few hours.  No sick contacts.  Reports abdominal pain-being worked up by the ED-had CT abdomen.  She was seen by telemedicine neurology at [facility] for this weakness, her NIH stroke scale was 14 with scores of 3 for both upper and lower extremities, sensory changes, and one-point for dysarthria.  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 Pasted below is the HPI from the initial consultation note at [facility] in December 2021 "DV:VOHYWV  HPI: [name] is a 40 y.o.LH female self-referred for chief complaint of spells . Reviewed history with patient. She states she was diagnosed in [place] with epilepsy, but no EEG at that time. Started on VPA in 2008, moved to [place] in 2015 and stopped VPA. In 2018 had seizure -like activity in church, [place] saw and started on KEppra, referred to [facility]. She had one sz last year in December, same as prior description (see below). She shakes bilaterally with no LOC, trembles for 25039 min, feels confused for about an hour after, has trouble thinking of words. She was hospitalized at [facility] for this last event, reports having EEG, does not know result. No records.   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 Since then she has seen other neurologist in [place] who has continued to prescribe ASMs (but now they do not take her insurance, so requested FU at [facility] as does not plan to FU with Dr Leta Baptist). Per prior  evaluation H&P 2018: Semiology:  intermittent tremors (R > L with big movements of arm but rest of body pretty stiff), disconnected feeling, slow responsiveness but no loss of consciousness. Can hear (sounds far away) but can't respond. Usually occurs in the evening in the past. Reported to [facility] hospital with abnormal movements but was able to respond and follow commands. Warning: yes, keeps cleaning her glasses (blurry vision). Then feels like she's getting tighter. 3-5 minutes to get safe Duration: 4-6 minutes in past, 30 minutes with last (recurrence in July) Freq: few weeks to months Last: Sept 25 (only 2 since recurrence) lasting 3 minutes but was by herself Post-ictal: word finding difficulty and slurred (still having residual word finding difficulty since July. Numb mouth and tongue) that can last for awhile. Super tired. R hand curls in at intermittent times. Triggers: none. "crazy baby daddy" who entered the picture about 15 years ago. Single mom with 2 children: 90 and 16 yrs old. Job stress. Moved from [place] 2015. Court issues started: 2008" Examination from the same visit confirms facial sensation split in the midline, astasia-abasia on gait testing, symmetric 5/5 strength and 2+ DTRs with flexor plantars bilaterally   ROS: Full ROS was performed and is negative except as noted in the HPI.    Past Medical History:  Diagnosis Date  . Abdominal pain, chronic, right lower quadrant   . Cervical cancer ([place]) 2015   Unsuccessful LEEP, then TAH  . 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 Complicated migraine [id number]   Left arm numbness, states she may have had a "mini-stroke"  . Diabetes mellitus without complication ([place])   . Diverticulosis 02/15/2017  . GERD (gastroesophageal reflux disease)    with hiatal hernia.  fundoplication in 0000000  . History of obesity 2010  . Ovarian cyst right    2008  . 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 Seizures ([place])   . Sickle cell anemia ([facility])   . Stroke [facility])    Family History  Problem Relation Age of Onset  . Renal cancer Mother   . Hypertension Mother   . Ulcerative colitis Father   . Diabetes Father   . Hypertension Father   . Diabetes Brother   . Hypertension Brother   . Asthma Brother   . Sickle cell trait Daughter   . Eczema Son   . Autism spectrum disorder Son   . Asthma Brother   . Eczema Brother   . Breast cancer Maternal Grandmother        diagnosed late 101s and died mid 61s.     Social History:   reports that she has never smoked. She has never used smokeless tobacco. She reports that she does not drink alcohol and does not use drugs.  Medications No current facility-administered medications for this encounter.  Current Outpatient Medications:  .  busPIRone (BUSPAR) 5 MG tablet, Take 1 tablet (5 mg total) by mouth 3 (three) times daily., Disp: 90 tablet, Rfl: 4 .  clobetasol ointment (TEMOVATE) 0.05 %, Apply to affected areas twice daily  as needed, Disp: 60 g, Rfl: 2 .  COSENTYX SENSOREADY, 300 MG, 150 MG/ML SOAJ, Inject into the skin., Disp: , Rfl:  .  cyanocobalamin 100 MCG tablet, Take 1,000 mcg by mouth daily., Disp: , Rfl:  .  dicyclomine (BENTYL) 10 MG capsule, 1 cap by mouth every 8 hours as needed, Disp: 60 capsule, Rfl: 2 .  EPINEPHrine (EPIPEN 2-PAK) 0.3 mg/0.3 mL IJ SOAJ injection, Inject 0.3 mg into the muscle as needed for anaphylaxis., Disp: 1 each, Rfl: 1 .  famotidine (PEPCID) 20 MG tablet, 3 tablets by mouth 1 hour before breakfast, Disp: 90 tablet, Rfl: 11 .  fexofenadine (ALLEGRA) 180 MG tablet, 1 tab by mouth daily as needed, Disp: 30 tablet, Rfl: 11 .  gabapentin (NEURONTIN) 400 MG capsule, TAKE 1 CAPSULE BY MOUTH THREE TIMES DAILY, Disp: 90 capsule, Rfl: 11 .  Iron Polysacch Cmplx-B12-FA 150-0.025-1 MG CAPS, 1 cap by mouth daily, Disp: 30 capsule, Rfl: 11 .   levETIRAcetam (KEPPRA) 1000 MG tablet, Take 1 tablet by mouth twice daily, Disp: 60 tablet, Rfl: 10 .  meloxicam (MOBIC) 15 MG tablet, TAKE 1 TABLET BY MOUTH DAILY, Disp: 30 tablet, Rfl: 4 .  methotrexate (RHEUMATREX) 2.5 MG tablet, Take 15 mg (6 tabs) weekly. Can take 3 tabs on Sunday and 3 tabs on Monday., Disp: , Rfl:  .  metoprolol succinate (TOPROL-XL) 25 MG 24 hr tablet, Take 2 tablets (50 mg total) by mouth daily. Take with or immediately following a meal., Disp: 30 tablet, Rfl: 11 .  naproxen sodium (ALEVE) 220 MG tablet, Take 220 mg by mouth daily as needed (pain)., Disp: , Rfl:  .  tiZANidine (ZANAFLEX) 4 MG tablet, TAKE 1/2 TO 1 (ONE-HALF TO ONE) TABLET BY MOUTH NIGHTLY AS NEEDED MAY  REPEAT  1  TIME, Disp: , Rfl:  .  venlafaxine XR (EFFEXOR XR) 75 MG 24 hr capsule, Take 1 capsule (75 mg total) by mouth daily with breakfast., Disp: 60 capsule, Rfl: 2   Exam: Current vital signs: BP (!) 139/96   Pulse 95   Temp 98.4 F (36.9 C) (Oral)   Resp 19   Ht 4\' 11"  (1.499 m)   Wt 61 kg   LMP 12/14/2012   SpO2 100%   BMI 27.15 kg/m  Vital signs in last 24 hours: Temp:  [98.2 F (36.8 C)-98.4 F (36.9 C)] 98.4 F (36.9 C) (05/05 0050) Pulse Rate:  [95-107] 95 (05/05 0130) Resp:  [12-20] 19 (05/05 0130) BP: (122-139)/(93-104) 139/96 (05/05 0130) SpO2:  [92 %-100 %] 100 % (05/05 0130) Weight:  [61 kg] 61 kg (05/04 2008)  GENERAL: Awake, alert in NAD, wearing sunglasses in a very dark ER room in the middle of the night, when asked why she is wearing sunglasses she reports she has sensitivity to light and if she is not wearing sunglasses inside, it can trigger her seizures. HEENT: - Normocephalic and atraumatic, dry mm, no LN++, no Thyromegally LUNGS - Clear to auscultation bilaterally with no wheezes CV - S1S2 RRR, no m/r/g, equal pulses bilaterally. ABDOMEN - Soft, nontender, nondistended with normoactive BS Ext: warm, well perfused, intact peripheral pulses, no edema  NEURO:   Mental Status: AA&Ox3 Language: speech is stuttering and quality.  Naming, repetition, fluency, and comprehension intact. Cranial Nerves: PERRL. EOMI, visual fields full, no facial asymmetry, facial sensation intact to touch-to vibration reports that the left side of the forehead feels stronger vibration on the right side of the forehead, hearing intact, tongue/uvula/soft palate midline, normal  sternocleidomastoid and trapezius muscle strength. No evidence of tongue atrophy or fibrillations Motor: Both upper extremities are 4/5 with coaching.  There is obvious effort dependent weakness in both upper extremities. Both lower extremities-show 3-4/5 strength with left weaker than right and positive Hoover sign. Tone: is normal and bulk is normal Sensation-diminished to light touch on the left leg comparison to the right. Coordination: Difficult to perform given her slow response and weakness Gait- deferred DTRs: 2+ knee jerks, 2+ biceps, difficult to elicit triceps bilaterally, mute ankle jerks.   NIHSS 1a Level of Conscious.: 0 1b LOC Questions: 0 1c LOC Commands: 0 2 Best Gaze: 0 3 Visual: 0 4 Facial Palsy: 0 5a Motor Arm - left: 1 5b Motor Arm - Right: 1 6a Motor Leg - Left: 2 6b Motor Leg - Right: 2 7 Limb Ataxia: 0 8 Sensory: 1 9 Best Language: 0 10 Dysarthria: 1 11 Extinct. and Inatten.: 0 TOTAL: 8  Labs I have reviewed labs in epic and the results pertinent to this consultation are:   CBC    Component Value Date/Time   WBC 4.8 01/21/2021 2111   RBC 4.11 01/21/2021 2111   HGB 9.9 (L) 01/21/2021 2111   HGB 9.3 (L) 10/29/2020 1118   HCT 30.3 (L) 01/21/2021 2111   HCT 30.2 (L) 10/29/2020 1118   PLT 261 01/21/2021 2111   PLT 252 10/29/2020 1118   MCV 73.7 (L) 01/21/2021 2111   MCV 79 10/29/2020 1118   MCH 24.1 (L) 01/21/2021 2111   MCHC 32.7 01/21/2021 2111   RDW 21.5 (H) 01/21/2021 2111   RDW 18.8 (H) 10/29/2020 1118   LYMPHSABS 1.3 01/21/2021 2111   LYMPHSABS 1.5  10/29/2020 1118   MONOABS 0.5 01/21/2021 2111   EOSABS 0.1 01/21/2021 2111   EOSABS 0.1 10/29/2020 1118   BASOSABS 0.1 01/21/2021 2111   BASOSABS 0.0 10/29/2020 1118    CMP     Component Value Date/Time   NA 139 01/21/2021 2111   NA 137 10/29/2020 1118   K 4.0 01/21/2021 2111   CL 102 01/21/2021 2111   CO2 24 01/21/2021 2111   GLUCOSE 93 01/21/2021 2111   BUN 10 01/21/2021 2111   BUN 10 10/29/2020 1118   CREATININE 0.57 01/21/2021 2111   CALCIUM 8.4 (L) 01/21/2021 2111   PROT 6.7 01/21/2021 2111   PROT 6.2 10/29/2020 1118   ALBUMIN 3.7 01/21/2021 2111   ALBUMIN 3.8 10/29/2020 1118   AST 30 01/21/2021 2111   ALT 21 01/21/2021 2111   ALKPHOS 56 01/21/2021 2111   BILITOT 0.3 01/21/2021 2111   BILITOT 0.3 10/29/2020 1118   GFRNONAA >60 01/21/2021 2111   GFRAA 129 10/29/2020 1118    Lipid Panel     Component Value Date/Time   CHOL 245 (H) 10/29/2020 1118   TRIG 56 10/29/2020 1118   HDL 113 10/29/2020 1118   LDLCALC 123 (H) 10/29/2020 1118     Imaging I have reviewed the images obtained:  CT-scan of the brain no acute stroke.  No acute changes  Assessment:  40 year old with past medical history of CIDP for which she was on infusion/plasma exchange for some time and then came off of it, headaches and complex migraine, diabetes controlled with diet, history of seizures versus PNES on Keppra, presenting for sudden onset of whole body weakness that started with dragging her left leg then involved both her legs and is also involved her arms.  Reports some shortness of breath that has improved. Also complains of  abdominal pain. 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 On my examination, she continues to exhibit some inconsistent findings with sensory exam but I could not elicit her ankle jerks-last exam done at [facility] does describe 2+ reflexes bilaterally but I was unable to elicit ankle jerks or triceps and given  her history of CIDP, I think further work-up is prudent although exam remains inconsistent.  Impression: Generalized ascending weakness-differentials include Guillain-Barr syndrome versus cervical spinal cord pathology versus functional disorder  Recommendations:  MRI brain with and without contrast  MRI C-spine with and without contrast  Spinal tap-bedside LP was attempted in the ER but was unsuccessful.  Would need fluoroscopy guided LP in the morning with cell count, glucose, protein, oligoclonal bands and IgG index.  I would hold off any treatment until we get some test results  Continue home AEDs  Seizure precautions  Plan relayed to the admitting hospitalist Dr. Nevada Crane via secure chat.  -- Amie Portland, MD Neurologist [facility] Pager: [phone]

## 2021-01-22 NOTE — ED Notes (Signed)
I spoke with Stacy Jackson in xray dept and all labs for the CSF fluid were sent per Stacy Jackson

## 2021-01-22 NOTE — Progress Notes (Signed)
OT Cancellation Note  Patient Details Name: Stacy Jackson MRN: [id number] DOB: 08/18/1981   Cancelled Treatment:    Reason Eval/Treat Not Completed: (P) Active bedrest order (after lumbar puncture.Lay flat x 4 hrs.)  Anevay Campanella,HILLARY 01/22/2021, 12:51 PM Maurie Boettcher, OT/L   Acute OT Clinical Specialist Acute Rehabilitation Services Pager [phone] Office [phone]

## 2021-01-23 DIAGNOSIS — Z8051 Family history of malignant neoplasm of kidney: Secondary | ICD-10-CM | POA: Diagnosis not present

## 2021-01-23 DIAGNOSIS — R531 Weakness: Secondary | ICD-10-CM | POA: Diagnosis not present

## 2021-01-23 DIAGNOSIS — Z832 Family history of diseases of the blood and blood-forming organs and certain disorders involving the immune mechanism: Secondary | ICD-10-CM | POA: Diagnosis not present

## 2021-01-23 DIAGNOSIS — K219 Gastro-esophageal reflux disease without esophagitis: Secondary | ICD-10-CM | POA: Diagnosis present

## 2021-01-23 DIAGNOSIS — I10 Essential (primary) hypertension: Secondary | ICD-10-CM | POA: Diagnosis present

## 2021-01-23 DIAGNOSIS — Z20822 Contact with and (suspected) exposure to covid-19: Secondary | ICD-10-CM | POA: Diagnosis present

## 2021-01-23 DIAGNOSIS — L4050 Arthropathic psoriasis, unspecified: Secondary | ICD-10-CM | POA: Diagnosis present

## 2021-01-23 DIAGNOSIS — Z8541 Personal history of malignant neoplasm of cervix uteri: Secondary | ICD-10-CM | POA: Diagnosis not present

## 2021-01-23 DIAGNOSIS — F32A Depression, unspecified: Secondary | ICD-10-CM | POA: Diagnosis present

## 2021-01-23 DIAGNOSIS — Z803 Family history of malignant neoplasm of breast: Secondary | ICD-10-CM | POA: Diagnosis not present

## 2021-01-23 DIAGNOSIS — R5383 Other fatigue: Secondary | ICD-10-CM

## 2021-01-23 DIAGNOSIS — D571 Sickle-cell disease without crisis: Secondary | ICD-10-CM | POA: Diagnosis present

## 2021-01-23 DIAGNOSIS — Z8249 Family history of ischemic heart disease and other diseases of the circulatory system: Secondary | ICD-10-CM | POA: Diagnosis not present

## 2021-01-23 DIAGNOSIS — Z9884 Bariatric surgery status: Secondary | ICD-10-CM | POA: Diagnosis not present

## 2021-01-23 DIAGNOSIS — G40909 Epilepsy, unspecified, not intractable, without status epilepticus: Secondary | ICD-10-CM | POA: Diagnosis present

## 2021-01-23 DIAGNOSIS — Z833 Family history of diabetes mellitus: Secondary | ICD-10-CM | POA: Diagnosis not present

## 2021-01-23 DIAGNOSIS — Z91013 Allergy to seafood: Secondary | ICD-10-CM | POA: Diagnosis not present

## 2021-01-23 DIAGNOSIS — F419 Anxiety disorder, unspecified: Secondary | ICD-10-CM | POA: Diagnosis present

## 2021-01-23 DIAGNOSIS — Z825 Family history of asthma and other chronic lower respiratory diseases: Secondary | ICD-10-CM | POA: Diagnosis not present

## 2021-01-23 DIAGNOSIS — M6281 Muscle weakness (generalized): Secondary | ICD-10-CM | POA: Diagnosis not present

## 2021-01-23 DIAGNOSIS — G8929 Other chronic pain: Secondary | ICD-10-CM | POA: Diagnosis present

## 2021-01-23 DIAGNOSIS — D509 Iron deficiency anemia, unspecified: Secondary | ICD-10-CM | POA: Diagnosis present

## 2021-01-23 LAB — IGG CSF INDEX
Albumin CSF-mCnc: 19 mg/dL (ref 7–29)
Albumin: 3.6 g/dL — ABNORMAL LOW (ref 3.8–4.8)
CSF IgG Index: 0.5 (ref 0.0–0.7)
IgG (Immunoglobin G), Serum: 782 mg/dL (ref 586–1602)
IgG, CSF: 1.9 mg/dL (ref 0.0–6.7)
IgG/Alb Ratio, CSF: 0.1 (ref 0.00–0.25)

## 2021-01-23 LAB — VDRL, CSF: VDRL Quant, CSF: NONREACTIVE

## 2021-01-23 LAB — URINE CULTURE: Culture: NORMAL

## 2021-01-23 NOTE — Progress Notes (Signed)
Physical Therapy Treatment Patient Details Name: Stacy Jackson MRN: [id number] DOB: 07-20-1981 Today's Date: 01/23/2021    History of Present Illness 40 y.o. female presenting with sudden onset generalized weakness, chronic epigastric pain and nausea and difficulty ambulating. MRI (-). CT suggestive of possible mild pancreatitis. Awaiting results of LP from 5/5. PMHx significant for CIDP, sickle cell anemia, seizure disorder, HTN, anxiety/depression and psoriatic arthritis.    PT Comments    Pt admitted with above diagnosis. Pt progressing andwas able to ambulate a short distance today with supervision. Would benefit from [facility] for stability and Outpt PT f/u for balance training.  Pt currently with functional limitations due to balance ande ndurance deficits. Pt will benefit from skilled PT to increase their independence and safety with mobility to allow discharge to the venue listed below.     Follow Up Recommendations  Outpatient PT     Equipment Recommendations   [facility])    Recommendations for Other Services       Precautions / Restrictions Precautions Precautions: Fall Restrictions Weight Bearing Restrictions: No    Mobility  Bed Mobility Overal bed mobility: Needs Assistance Bed Mobility: Supine to Sit     Supine to sit: Independent Sit to supine: Independent        Transfers Overall transfer level: Independent Equipment used: 1 person hand held assist Transfers: Sit to/from Stand Sit to Stand: Independent         General transfer comment: Sit to stand x several trials from various surfaces with I.  Ambulation/Gait Ambulation/Gait assistance: Supervision Gait Distance (Feet): 90 Feet Assistive device: None;1 person hand held assist Gait Pattern/deviations: Step-through pattern;Decreased stride length   Gait velocity interpretation: <1.31 ft/sec, indicative of household ambulator General Gait Details: Slow and steady gait walking laps in room. Pt  tends to like at least 1 UE support and feel that a SBQC would be helpful as pt does not want a RW and feel that a cane is sufficient for support as she did not lose balance today.   Stairs             Wheelchair Mobility    Modified Rankin (Stroke Patients Only)       Balance Overall balance assessment: Needs assistance Sitting-balance support: No upper extremity supported;Feet supported Sitting balance-Leahy Scale: Good Sitting balance - Comments: Able to maintain static/dynamic sitting balance with I.   Standing balance support: No upper extremity supported;During functional activity Standing balance-Leahy Scale: Good Standing balance comment: Patient maintains dynamic standing balance with I but at times wants to have 1 UE support. Able to bend/turn without difficulty.                            Cognition Arousal/Alertness: Awake/alert Behavior During Therapy: WFL for tasks assessed/performed Overall Cognitive Status: Within Functional Limits for tasks assessed                                        Exercises General Exercises - Lower Extremity Ankle Circles/Pumps: AROM;Both;10 reps;Supine Long Arc Quad: AROM;Both;10 reps;Seated    General Comments        Pertinent Vitals/Pain Pain Assessment: No/denies pain    Home Living                      Prior Function  PT Goals (current goals can now be found in the care plan section) Acute Rehab PT Goals Patient Stated Goal: To return home. Progress towards PT goals: Progressing toward goals    Frequency    Min 3X/week      PT Plan Current plan remains appropriate    Co-evaluation              AM-PAC PT "6 Clicks" Mobility   Outcome Measure  Help needed turning from your back to your side while in a flat bed without using bedrails?: None Help needed moving from lying on your back to sitting on the side of a flat bed without using bedrails?:  None Help needed moving to and from a bed to a chair (including a wheelchair)?: None Help needed standing up from a chair using your arms (e.g., wheelchair or bedside chair)?: None Help needed to walk in hospital room?: A Little Help needed climbing 3-5 steps with a railing? : A Little 6 Click Score: 22    End of Session Equipment Utilized During Treatment: Gait belt Activity Tolerance: Patient tolerated treatment well Patient left: in bed;with call bell/phone within reach Nurse Communication: Mobility status PT Visit Diagnosis: Unsteadiness on feet (R26.81);Muscle weakness (generalized) (M62.81);Difficulty in walking, not elsewhere classified (R26.2)     Time: 1209-1224 PT Time Calculation (min) (ACUTE ONLY): 15 min  Charges:  $Gait Training: 8-22 mins                     Destiny Trickey M,PT [place] [phone] (pager)   Alvira Philips 01/23/2021, 1:50 PM

## 2021-01-23 NOTE — Evaluation (Signed)
Occupational Therapy Evaluation Patient Details Name: Stacy Jackson MRN: [id number] DOB: 1981-05-11 Today's Date: 01/23/2021    History of Present Illness 40 y.o. female presenting with sudden onset generalized weakness, chronic epigastric pain and nausea and difficulty ambulating. MRI (-). CT suggestive of possible mild pancreatitis. Awaiting results of LP from 5/5. PMHx significant for CIDP, sickle cell anemia, seizure disorder, HTN, anxiety/depression and psoriatic arthritis.   Clinical Impression   PTA patient was living in a private residence with her 33 y.o. daughter and was I to 70 I with ADLs/IADLs with use of DME including a shower seat. Patient has a number of chronic illnesses that impact energy levels but reports use of energy conservation strategies and DME including a shower seat. Patient has a good understanding of her conditions and how to manage them. Patient in long-sitting eating breakfast upon entry with ability to manage packages and utensils without difficulty. Patient also completed bed mobility, functional mobility, ADL tranfers, 3/3 parts of toileting task, and grooming standing at sink level with I. Patient states that on "bad days" it's difficulty for her to stand for longer than a few minutes at a time 2/2 pain and unsteadiness. Patient may benefit from use of AD on difficulty days. Patient does not require continued acute occupational therapy services with OT to sign off at this time. Please refer to PT note for additional recommendations.     Follow Up Recommendations  No OT follow up    Equipment Recommendations  None recommended by OT    Recommendations for Other Services       Precautions / Restrictions Precautions Precautions: Fall Restrictions Weight Bearing Restrictions: No      Mobility Bed Mobility Overal bed mobility: Needs Assistance Bed Mobility: Supine to Sit     Supine to sit: Independent     General bed mobility comments:  Supine to sit with I.    Transfers Overall transfer level: Independent   Transfers: Sit to/from Stand Sit to Stand: Independent         General transfer comment: Sit to stand x several trials from various surfaces with I.    Balance Overall balance assessment: Needs assistance Sitting-balance support: No upper extremity supported;Feet supported Sitting balance-Leahy Scale: Good Sitting balance - Comments: Able to maintain static/dynamic sitting balance with I.   Standing balance support: No upper extremity supported;During functional activity Standing balance-Leahy Scale: Good Standing balance comment: Patient maintains dynamic standing balance with I. Able to bend/turn without difficulty.                           ADL either performed or assessed with clinical judgement   ADL Overall ADL's : Needs assistance/impaired Eating/Feeding: Independent Eating/Feeding Details (indicate cue type and reason): Patient able to open packages and manipulate utensils without difficulty. No tremors noted this date. Grooming: Independent;Wash/dry hands;Standing Grooming Details (indicate cue type and reason): Patient completed hand washing standing at sink level with I.                 Toilet Transfer: Dentist and Hygiene: Independent Toileting - Clothing Manipulation Details (indicate cue type and reason): Toileting/hygiene/clothing management with I.     Functional mobility during ADLs: Independent General ADL Comments: Patient completed all observed ADLs with I this date. Notes that energy levels fluctuate from day to day.     Vision Baseline Vision/History: Wears glasses Wears Glasses: At all times Patient Visual Report:  No change from baseline Vision Assessment?: No apparent visual deficits     Perception     Praxis      Pertinent Vitals/Pain Pain Assessment: 0-10 Pain Score: 7  Pain Location: Back (from LP) and  stomach Pain Descriptors / Indicators: Aching;Sore Pain Intervention(s): Limited activity within patient's tolerance;Monitored during session;Repositioned     Hand Dominance Left   Extremity/Trunk Assessment Upper Extremity Assessment Upper Extremity Assessment: Overall WFL for tasks assessed (During functional assessment. Please refer below for more details.)   Lower Extremity Assessment RLE Deficits / Details: Grossly 3+/5 throughout. Increased tremors noted in LLE. LLE Deficits / Details: Grossly 3+/5 throughout. Increased tremors noted in LLE.   Cervical / Trunk Assessment Cervical / Trunk Assessment: Normal   Communication Communication Communication: No difficulties   Cognition Arousal/Alertness: Awake/alert Behavior During Therapy: WFL for tasks assessed/performed Overall Cognitive Status: Within Functional Limits for tasks assessed                                     General Comments  Patient reports fluctuating levels of energy given number of chronic illnesses. Patient able to recall 3 energy conservation techniques that she uses to manage ADLs/IADLs at home.    Exercises     Shoulder Instructions      Home Living Family/patient expects to be discharged to:: Private residence Living Arrangements: Children (62 y/o daughter) Available Help at Discharge: Family Type of Home: House Home Access: Stairs to enter Technical brewer of Steps: 3 Entrance Stairs-Rails: Left Home Layout: One level     Bathroom Shower/Tub: Teacher, early years/pre: Standard     Home Equipment: Shower seat          Prior Functioning/Environment Level of Independence: Independent with assistive device(s)        Comments: I with ADLs/IADLs with DME. Patient does not work.        OT Problem List:        OT Treatment/Interventions:      OT Goals(Current goals can be found in the care plan section) Acute Rehab OT Goals Patient Stated Goal: To  return home. OT Goal Formulation: With patient Time For Goal Achievement: 02/06/21 Potential to Achieve Goals: Good  OT Frequency:     Barriers to D/C:            Co-evaluation              AM-PAC OT "6 Clicks" Daily Activity     Outcome Measure Help from another person eating meals?: None Help from another person taking care of personal grooming?: None Help from another person toileting, which includes using toliet, bedpan, or urinal?: None Help from another person bathing (including washing, rinsing, drying)?: A Little Help from another person to put on and taking off regular upper body clothing?: None Help from another person to put on and taking off regular lower body clothing?: None 6 Click Score: 23   End of Session Nurse Communication: Mobility status  Activity Tolerance: Patient tolerated treatment well Patient left: in chair;with call bell/phone within reach  OT Visit Diagnosis: Muscle weakness (generalized) (M62.81)                Time: 6387-5643 OT Time Calculation (min): 13 min Charges:  OT General Charges $OT Visit: 1 Visit OT Evaluation $OT Eval Low Complexity: 1 Low  Deondrick Searls H. OTR/L Supplemental OT, Department of rehab services [phone]  [place]. 01/23/2021, 8:56 AM

## 2021-01-23 NOTE — Progress Notes (Signed)
Neurology Progress Note  Brief HPI: 40 y.o. 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   Subjective: Patient endorses feeling fatigued but feels her weakness and speech have improved since hospitalization. She states that she sometimes has similar prolonged fatigue after a seizure She complains of right > left weakness (initially reported left > right weakness) Complains of blurry "watery" vision with itchy eyes initially on the right and now on the left with some pain in her right gaze described as a "poke". Also endorses that her handwriting is "off" and looks like a child's handwriting.   Exam: Vitals:   01/23/21 0453 01/23/21 0724  BP: (!) 137/98 139/89  Pulse: 81 81  Resp:  18  Temp:  98.8 F (37.1 C)  SpO2: 98% 100%   Gen: In bed, awake, alert, in no acute distress Resp: non-labored breathing, no respiratory distress Abd: soft, non-tender  Neuro: Mental Status: Awake, alert, and oriented to self, age, place, month, year, and situation. She is able to provide a clear and coherent history of present illness. Her speech is intact without dysarthria or aphasia. No neglect is noted. Naming, repetition, comprehension, and fluency are intact.  Cranial Nerves:  Pupils equal, round, and reactive to light bilaterally, visual fields are full, EOMI without ptosis, sensation to face is intact and symmetric to light touch, face is symmetric resting and smiling, hearing is intact to voice bilaterally, shoulder shrug is symmetric, phonation is normal, tongue protrudes midline. Motor: Effort dependent limitation on motor exam throughout. Left upper extremity 4-/5 throughout, right upper extremity 4/5 throughout. Bilateral lower extremities  with 3/5 strength at the hips bilaterally, 4/5 strength at the knee with extension and flexion, and 4/5 strength of the ankle dorsi/plantar flexion.  Tone and bulk are normal.  Sensory: Patient reports sensation to light touch is decreased on the RUE compared to the LUE with decreased sensation to cool temperature to the LUE compared to RUE. BLE sensation to light touch is intact and symmetric, LLE with cooler temperature sensation to the RLE. DTR: 2+ and symmetric biceps, 2+ and symmetric patellae, mute ankles.  Pertinent Labs: CBC    Component Value Date/Time   WBC 3.9 (L) 01/22/2021 2134   RBC 3.72 (L) 01/22/2021 2134   HGB 9.0 (L) 01/22/2021 2134   HGB 9.3 (L) 10/29/2020 1118   HCT 27.9 (L) 01/22/2021 2134   HCT 30.2 (L) 10/29/2020 1118   PLT 229 01/22/2021 2134   PLT 252 10/29/2020 1118   MCV 75.0 (L) 01/22/2021 2134   MCV 79 10/29/2020 1118   MCH 24.2 (L) 01/22/2021 2134   MCHC 32.3 01/22/2021 2134   RDW 20.7 (H) 01/22/2021 2134   RDW 18.8 (H) 10/29/2020 1118   LYMPHSABS 1.3 01/21/2021 2111   LYMPHSABS 1.5 10/29/2020 1118   MONOABS 0.5 01/21/2021 2111   EOSABS 0.1 01/21/2021 2111   EOSABS 0.1 10/29/2020 1118   BASOSABS 0.1 01/21/2021 2111   BASOSABS 0.0 10/29/2020 1118   CMP     Component Value Date/Time   NA 133 (L) 01/22/2021 2134   NA 137 10/29/2020 1118   K 3.9 01/22/2021 2134   CL 100 01/22/2021 2134   CO2 26 01/22/2021 2134   GLUCOSE 147 (H) 01/22/2021 2134   BUN 11 01/22/2021 2134   BUN  10 10/29/2020 1118   CREATININE 0.72 01/22/2021 2134   CALCIUM 8.4 (L) 01/22/2021 2134   PROT 6.3 (L) 01/22/2021 2134   PROT 6.2 10/29/2020 1118   ALBUMIN 3.3 (L) 01/22/2021 2134   ALBUMIN 3.8 10/29/2020 1118   AST 28 01/22/2021 2134   ALT 24 01/22/2021 2134   ALKPHOS 60 01/22/2021 2134   BILITOT 0.8 01/22/2021 2134   BILITOT 0.3 10/29/2020 1118   GFRNONAA >60 01/22/2021 2134   GFRAA 129 10/29/2020 1118   Results for BLAYKE, PINERA (MRN [id number]) as of  01/23/2021 11:29  Ref. Range 01/22/2021 00:35  Appearance, CSF Latest Ref Range: CLEAR  CLEAR  Glucose, CSF Latest Ref Range: 40 - 70 mg/dL 54  RBC Count, CSF Latest Ref Range: 0 /cu mm 3 (H)  WBC, CSF Latest Ref Range: 0 - 5 /cu mm 1  Other Cells, CSF Unknown TOO FEW TO COUNT, SMEAR AVAILABLE FOR REVIEW  Color, CSF Latest Ref Range: COLORLESS  COLORLESS  Supernatant Unknown NOT INDICATED  Total  Protein, CSF Latest Ref Range: 15 - 45 mg/dL 26  Tube # Unknown 1  Results for KEIRAH, KONITZER (MRN [id number]) as of 01/23/2021 11:29  Ref. Range 01/22/2021 09:28  Glucose, CSF Latest Ref Range: 40 - 70 mg/dL 55  Gram Stain Unknown NO WBC SEEN...   Results for orders placed or performed during the hospital encounter of 01/21/21  Resp Panel by RT-PCR (Flu A&B, Covid) Nasopharyngeal Swab     Status: None   Collection Time: 01/21/21 11:30 PM   Specimen: Nasopharyngeal Swab; Nasopharyngeal(NP) swabs in vial transport medium  Result Value Ref Range Status   SARS Coronavirus 2 by RT PCR NEGATIVE NEGATIVE Final    Comment: (NOTE) SARS-CoV-2 target nucleic acids are NOT DETECTED.  The SARS-CoV-2 RNA is generally detectable in upper respiratory specimens during the acute phase of infection. The lowest concentration of SARS-CoV-2 viral copies this assay can detect is 138 copies/mL. A negative result does not preclude SARS-Cov-2 infection and should not be used as the sole basis for treatment or other patient management decisions. A negative result may occur with  improper specimen collection/handling, submission of specimen other than nasopharyngeal swab, presence of viral mutation(s) within the areas targeted by this assay, and inadequate number of viral copies(<138 copies/mL). A negative result must be combined with clinical observations, patient history, and epidemiological information. The expected result is Negative.  Fact Sheet for Patients:   EntrepreneurPulse.com.au  Fact Sheet for Healthcare Providers:  IncredibleEmployment.be  This test is no t yet approved or cleared by the [place] FDA and  has been authorized for detection and/or diagnosis of SARS-CoV-2 by FDA under an Emergency Use Authorization (EUA). This EUA will remain  in effect (meaning this test can be used) for the duration of the COVID-19 declaration under Section 564(b)(1) of the Act, 21 U.S.C.section 360bbb-3(b)(1), unless the authorization is terminated  or revoked sooner.       Influenza A by PCR NEGATIVE NEGATIVE Final   Influenza B by PCR NEGATIVE NEGATIVE Final    Comment: (NOTE) The Xpert Xpress SARS-CoV-2/FLU/RSV plus assay is intended as an aid in the diagnosis of influenza from Nasopharyngeal swab specimens and should not be used as a sole basis for treatment. Nasal washings and aspirates are unacceptable for Xpert Xpress SARS-CoV-2/FLU/RSV testing.  Fact Sheet for Patients: EntrepreneurPulse.com.au  Fact Sheet for Healthcare Providers: IncredibleEmployment.be  This test is not yet approved or cleared by the [place] FDA and has been  authorized for detection and/or diagnosis of SARS-CoV-2 by FDA under an Emergency Use Authorization (EUA). This EUA will remain in effect (meaning this test can be used) for the duration of the COVID-19 declaration under Section 564(b)(1) of the Act, 21 U.S.C. section 360bbb-3(b)(1), unless the authorization is terminated or revoked.  Performed at [facility], [address] [place], [place] [address]   Urine culture     Status: None   Collection Time: 01/22/21  1:17 AM   Specimen: Urine, Random  Result Value Ref Range Status   Specimen Description URINE, RANDOM  Final   Special Requests NONE  Final   Culture   Final    Consistent with normal respiratory flora. Performed at [facility], [place]  [address] [place], [place] [address]    Report Status 01/23/2021 FINAL  Final  CSF culture w Gram Stain     Status: None (Preliminary result)   Collection Time: 01/22/21  9:28 AM   Specimen: PATH Cytology CSF; Cerebrospinal Fluid  Result Value Ref Range Status   Specimen Description CSF  Final   Special Requests CSF  Final   Gram Stain NO WBC SEEN NO ORGANISMS SEEN CYTOSPIN SMEAR   Final   Culture   Final    NO GROWTH < 24 HOURS Performed at [facility], [place] [address], [place], [place] [address]    Report Status PENDING  Incomplete  Culture, fungus without smear     Status: None (Preliminary result)   Collection Time: 01/22/21  9:28 AM   Specimen: PATH Cytology CSF; Cerebrospinal Fluid  Result Value Ref Range Status   Specimen Description CSF  Final   Special Requests NONE  Final   Culture   Final    NO FUNGUS ISOLATED AFTER 1 DAY Performed at [facility], [place] [address], [place], [place] [address]    Report Status PENDING  Incomplete   Imaging Reviewed:  CT Head: No acute intracranial abnormality.  MRI Brain and cervical spine:  Normal MRI of the brain and cervical spine.  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 - Examination continues to exhibit some inconsistent findings with sensory exam but persistent examiner inability to elicit her ankle jerks. - Given her history of CIDP, LP was indicated although exam remained inconsistent. - MRI brain and cervical spine without evidence of demyelinating lesion.  - Initial CSF studies with normal glucose, 1 WBC,  protein 26, clear and colorless. CSF IgG index is normal at 0.5. VDRL negative. Pending culture with gram stain (negative x 24 hours), fungal culture, HSV PCR.  Impression:  - Generalized ascending weakness-differentials include Guillain-Barr syndrome versus cervical spinal cord pathology versus functional disorder.  - Her CSF results are not consistent with CIDP recurrence - Overall impression is that the current presentation is most likely to be functional  Recommendations: - Follow up on CSF cultures - Daily PT and OT - Continue home AEDs - Inpatient seizure precautions - Close outpatient Neurology follow up - Neurohospitalist service will sign off. Please call if there are additional questions.  Anibal Henderson, AGACNP-BC [facility] [phone]  Electronically signed: Dr. Kerney Elbe

## 2021-01-23 NOTE — Progress Notes (Signed)
Patient ID: Stacy Jackson, female   DOB: 1980-12-11, 40 y.o.   MRN: [id number]  PROGRESS NOTE    Stacy Jackson  WUJ:[id number] DOB: 1981/04/02 DOA: 01/21/2021 PCP: Mack Hook, MD   Brief Narrative:  40 y.o. female with medical history significant for CIDP, seizure disorder, iron deficiency anemia, essential hypertension, polyneuropathy, GERD, chronic anxiety/depression, psoriatic arthritis presented with sudden onset of generalized weakness associated with difficulty walking.  On presentation, LP was attempted in the ED at [facility] unsuccessfully.  Neurology was consulted and she was transferred to [facility] for further management.  Assessment & Plan:   Generalized weakness, unclear etiology -Sudden onset, no clear evidence of active infectious process -Neurology following.  Status post fluoroscopy guided LP on 01/22/2021.  MRI of brain and cervical spine were normal. -Follow further neurology recommendations.  PT/OT evaluation  Possible mild pancreatitis seen on CT scan -Abdominal exam is benign with normal LFTs. -Tolerating diet.  Doubt that patient is any clinical signs of pancreatitis  Seizure disorder-continue home Keppra.  Seizure precautions  Chronic microcytic anemia, history of iron deficiency anemia -Hemoglobin stable.  Continue iron supplementation  Essential hypertension -Continue Toprol-XL  Chronic anxiety/depression -Continue Effexor  GERD -Continue famotidine   DVT prophylaxis: SCDs Code Status: Full Family Communication:  Disposition Plan: Status is: Observation  The patient will require care spanning > 2 midnights and should be moved to inpatient because: Inpatient level of care appropriate due to severity of illness  Dispo: The patient is from: Home              Anticipated d/c is to: Home              Patient currently is not medically stable to d/c.   Difficult to place patient No    Consultants:  Neurology  Procedures: Fluoroscopy-guided LP on 01/22/2021  Antimicrobials: None   Subjective: Patient seen and examined at bedside.  Still feels generally weak but weakness is improving.  No overnight fever, vomiting or shortness of breath reported  Objective: Vitals:   01/22/21 2059 01/23/21 0452 01/23/21 0453 01/23/21 0724  BP: (!) 142/111 (!) 142/101 (!) 137/98 139/89  Pulse: 82 90 81 81  Resp: 18 18  18   Temp: 98.5 F (36.9 C) 99 F (37.2 C)  98.8 F (37.1 C)  TempSrc:    Oral  SpO2: 100% 99% 98% 100%  Weight:      Height:        Intake/Output Summary (Last 24 hours) at 01/23/2021 0800 Last data filed at 01/22/2021 2100 Gross per 24 hour  Intake 240 ml  Output --  Net 240 ml   Filed Weights   01/21/21 2008 01/22/21 2059  Weight: 61 kg 61.5 kg    Examination:  General exam: Appears calm and comfortable  Respiratory system: Bilateral decreased breath sounds at bases Cardiovascular system: S1 & S2 heard, Rate controlled Gastrointestinal system: Abdomen is nondistended, soft and nontender. Normal bowel sounds heard. Extremities: No cyanosis, clubbing, edema  Central nervous system: Alert and oriented. No focal neurological deficits. Moving extremities Skin: No rashes, lesions or ulcers Psychiatry: Flat affect    Data Reviewed: I have personally reviewed following labs and imaging studies  CBC: Recent Labs  Lab 01/21/21 2111 01/22/21 2134  WBC 4.8 3.9*  NEUTROABS 2.7  --   HGB 9.9* 9.0*  HCT 30.3* 27.9*  MCV 73.7* 75.0*  PLT 261 956   Basic Metabolic Panel: Recent Labs  Lab 01/21/21 2111 01/22/21 2134  NA 139 133*  K 4.0 3.9  CL 102 100  CO2 24 26  GLUCOSE 93 147*  BUN 10 11  CREATININE 0.57 0.72  CALCIUM 8.4* 8.4*  MG  --  2.1  PHOS  --  2.9   GFR: Estimated Creatinine Clearance: 75.3 mL/min (by C-G formula based on SCr of 0.72 mg/dL). Liver Function Tests: Recent Labs  Lab 01/21/21 2111 01/22/21 2134  AST 30 28  ALT 21 24  ALKPHOS  56 60  BILITOT 0.3 0.8  PROT 6.7 6.3*  ALBUMIN 3.7 3.3*   Recent Labs  Lab 01/21/21 2111 01/22/21 2134  LIPASE 196* 190*   No results for input(s): AMMONIA in the last 168 hours. Coagulation Profile: No results for input(s): INR, PROTIME in the last 168 hours. Cardiac Enzymes: No results for input(s): CKTOTAL, CKMB, CKMBINDEX, TROPONINI in the last 168 hours. BNP (last 3 results) No results for input(s): PROBNP in the last 8760 hours. HbA1C: No results for input(s): HGBA1C in the last 72 hours. CBG: No results for input(s): GLUCAP in the last 168 hours. Lipid Profile: No results for input(s): CHOL, HDL, LDLCALC, TRIG, CHOLHDL, LDLDIRECT in the last 72 hours. Thyroid Function Tests: No results for input(s): TSH, T4TOTAL, FREET4, T3FREE, THYROIDAB in the last 72 hours. Anemia Panel: No results for input(s): VITAMINB12, FOLATE, FERRITIN, TIBC, IRON, RETICCTPCT in the last 72 hours. Sepsis Labs: No results for input(s): PROCALCITON, LATICACIDVEN in the last 168 hours.  Recent Results (from the past 240 hour(s))  Resp Panel by RT-PCR (Flu A&B, Covid) Nasopharyngeal Swab     Status: None   Collection Time: 01/21/21 11:30 PM   Specimen: Nasopharyngeal Swab; Nasopharyngeal(NP) swabs in vial transport medium  Result Value Ref Range Status   SARS Coronavirus 2 by RT PCR NEGATIVE NEGATIVE Final    Comment: (NOTE) SARS-CoV-2 target nucleic acids are NOT DETECTED.  The SARS-CoV-2 RNA is generally detectable in upper respiratory specimens during the acute phase of infection. The lowest concentration of SARS-CoV-2 viral copies this assay can detect is 138 copies/mL. A negative result does not preclude SARS-Cov-2 infection and should not be used as the sole basis for treatment or other patient management decisions. A negative result may occur with  improper specimen collection/handling, submission of specimen other than nasopharyngeal swab, presence of viral mutation(s) within  the areas targeted by this assay, and inadequate number of viral copies(<138 copies/mL). A negative result must be combined with clinical observations, patient history, and epidemiological information. The expected result is Negative.  Fact Sheet for Patients:  EntrepreneurPulse.com.au  Fact Sheet for Healthcare Providers:  IncredibleEmployment.be  This test is no t yet approved or cleared by the [place] FDA and  has been authorized for detection and/or diagnosis of SARS-CoV-2 by FDA under an Emergency Use Authorization (EUA). This EUA will remain  in effect (meaning this test can be used) for the duration of the COVID-19 declaration under Section 564(b)(1) of the Act, 21 U.S.C.section 360bbb-3(b)(1), unless the authorization is terminated  or revoked sooner.       Influenza A by PCR NEGATIVE NEGATIVE Final   Influenza B by PCR NEGATIVE NEGATIVE Final    Comment: (NOTE) The Xpert Xpress SARS-CoV-2/FLU/RSV plus assay is intended as an aid in the diagnosis of influenza from Nasopharyngeal swab specimens and should not be used as a sole basis for treatment. Nasal washings and aspirates are unacceptable for Xpert Xpress SARS-CoV-2/FLU/RSV testing.  Fact Sheet for Patients: EntrepreneurPulse.com.au  Fact Sheet for Healthcare Providers: IncredibleEmployment.be  This test is not yet approved or cleared by the [place] and has been authorized for detection and/or diagnosis of SARS-CoV-2 by FDA under an Emergency Use Authorization (EUA). This EUA will remain in effect (meaning this test can be used) for the duration of the COVID-19 declaration under Section 564(b)(1) of the Act, 21 U.S.C. section 360bbb-3(b)(1), unless the authorization is terminated or revoked.  Performed at [facility], [address] [place], [place] [address]   CSF culture w Gram Stain     Status: None  (Preliminary result)   Collection Time: 01/22/21  9:28 AM   Specimen: PATH Cytology CSF; Cerebrospinal Fluid  Result Value Ref Range Status   Specimen Description CSF  Final   Special Requests CSF  Final   Gram Stain   Final    NO WBC SEEN NO ORGANISMS SEEN CYTOSPIN SMEAR Performed at [facility], [place] [address], [place], [place] [address]    Culture PENDING  Incomplete   Report Status PENDING  Incomplete         Radiology Studies: CT Head Wo Contrast  Result Date: 01/21/2021 CLINICAL DATA:  Weakness. EXAM: CT HEAD WITHOUT CONTRAST TECHNIQUE: Contiguous axial images were obtained from the base of the skull through the vertex without intravenous contrast. COMPARISON:  None. FINDINGS: Brain: No evidence of acute infarction, hemorrhage, hydrocephalus, extra-axial collection or mass lesion/mass effect. Vascular: No hyperdense vessel or unexpected calcification. Skull: Normal. Negative for fracture or focal lesion. Sinuses/Orbits: No acute finding. Other: None. IMPRESSION: No acute intracranial abnormality. Electronically Signed   By: Virgina Norfolk M.D.   On: 01/21/2021 21:13   MR Brain W and Wo Contrast  Result Date: 01/22/2021 CLINICAL DATA:  Upper and lower extremity weakness EXAM: MRI HEAD WITHOUT AND WITH CONTRAST MRI CERVICAL SPINE WITHOUT AND WITH CONTRAST TECHNIQUE: Multiplanar, multiecho pulse sequences of the brain and surrounding structures, and cervical spine, to include the craniocervical junction and cervicothoracic junction, were obtained without and with intravenous contrast. CONTRAST:  68mL GADAVIST GADOBUTROL 1 MMOL/ML IV SOLN COMPARISON:  None. FINDINGS: MRI HEAD FINDINGS Brain: No acute infarct, mass effect or extra-axial collection. No acute or chronic hemorrhage. Normal white matter signal, parenchymal volume and CSF spaces. The midline structures are normal. There is no abnormal contrast enhancement. Vascular: Major flow voids are preserved. Skull and upper  cervical spine: Normal calvarium and skull base. Visualized upper cervical spine and soft tissues are normal. Sinuses/Orbits:No paranasal sinus fluid levels or advanced mucosal thickening. No mastoid or middle ear effusion. Normal orbits. MRI CERVICAL SPINE FINDINGS Alignment: Physiologic. Vertebrae: No fracture, evidence of discitis, or bone lesion. Cord: Normal signal and morphology. No abnormal contrast enhancement. Posterior Fossa, vertebral arteries, paraspinal tissues: Negative. Disc levels: No disc herniation or stenosis. IMPRESSION: Normal MRI of the brain and cervical spine. Electronically Signed   By: Ulyses Jarred M.D.   On: 01/22/2021 03:39   MR Cervical Spine W or Wo Contrast  Result Date: 01/22/2021 CLINICAL DATA:  Upper and lower extremity weakness EXAM: MRI HEAD WITHOUT AND WITH CONTRAST MRI CERVICAL SPINE WITHOUT AND WITH CONTRAST TECHNIQUE: Multiplanar, multiecho pulse sequences of the brain and surrounding structures, and cervical spine, to include the craniocervical junction and cervicothoracic junction, were obtained without and with intravenous contrast. CONTRAST:  80mL GADAVIST GADOBUTROL 1 MMOL/ML IV SOLN COMPARISON:  None. FINDINGS: MRI HEAD FINDINGS Brain: No acute infarct, mass effect or extra-axial collection. No acute or chronic hemorrhage. Normal white matter signal, parenchymal volume and  CSF spaces. The midline structures are normal. There is no abnormal contrast enhancement. Vascular: Major flow voids are preserved. Skull and upper cervical spine: Normal calvarium and skull base. Visualized upper cervical spine and soft tissues are normal. Sinuses/Orbits:No paranasal sinus fluid levels or advanced mucosal thickening. No mastoid or middle ear effusion. Normal orbits. MRI CERVICAL SPINE FINDINGS Alignment: Physiologic. Vertebrae: No fracture, evidence of discitis, or bone lesion. Cord: Normal signal and morphology. No abnormal contrast enhancement. Posterior Fossa, vertebral  arteries, paraspinal tissues: Negative. Disc levels: No disc herniation or stenosis. IMPRESSION: Normal MRI of the brain and cervical spine. Electronically Signed   By: Ulyses Jarred M.D.   On: 01/22/2021 03:39   CT ABDOMEN PELVIS W CONTRAST  Result Date: 01/21/2021 CLINICAL DATA:  Epigastric pain EXAM: CT ABDOMEN AND PELVIS WITH CONTRAST TECHNIQUE: Multidetector CT imaging of the abdomen and pelvis was performed using the standard protocol following bolus administration of intravenous contrast. CONTRAST:  156mL OMNIPAQUE IOHEXOL 300 MG/ML  SOLN COMPARISON:  None. FINDINGS: Lower chest: Lung bases demonstrate no acute consolidation or effusion. Normal cardiac size. Hepatobiliary: No calcified gallstone. No biliary dilatation. 14 mm enhancing lesion within the posterior right hepatic lobe, series 2, image 21. Pancreas: No ductal dilatation. Questionable mild fluid around the pancreas uncinate process. Spleen: Normal in size without focal abnormality. Adrenals/Urinary Tract: Adrenal glands are unremarkable. Kidneys are normal, without renal calculi, focal lesion, or hydronephrosis. Bladder is unremarkable. Stomach/Bowel: Status post gastric bypass. No dilated small bowel. No acute bowel wall thickening. Vascular/Lymphatic: No significant vascular findings are present. No enlarged abdominal or pelvic lymph nodes. Reproductive: Status post hysterectomy.  No adnexal mass Other: Negative for free air or free fluid. Musculoskeletal: No acute or significant osseous findings. IMPRESSION: 1. Questionable small amount of fluid at the uncinate process of the pancreas, suggest correlation with enzymes to exclude pancreatitis. 2. Status post gastric bypass without evidence for bowel obstruction. 3. 14 mm enhancing lesion within the posterior right hepatic lobe possibly a hemangioma though consider confirmation with nonemergent/outpatient MRI. Electronically Signed   By: Donavan Foil M.D.   On: 01/21/2021 23:28   DG FL  GUIDED LUMBAR PUNCTURE  Result Date: 01/22/2021 CLINICAL DATA:  Weakness since yesterday EXAM: DIAGNOSTIC LUMBAR PUNCTURE UNDER FLUOROSCOPIC GUIDANCE COMPARISON:  CT abdomen pelvis 01/21/2021 FLUOROSCOPY TIME:  Fluoroscopy Time:  6 seconds Radiation Exposure Index (if provided by the fluoroscopic device): 0.6 mGy Number of Acquired Spot Images: 1 PROCEDURE: Informed consent was obtained from the patient prior to the procedure, including potential complications of headache, allergy, and pain. With the patient prone, the lower back was prepped with Betadine. 1% Lidocaine was used for local anesthesia. Lumbar puncture was performed at the L4-L5 level using a 22 gauge needle with return of clear CSF. Opening pressure was not obtained. 9 ml of CSF were obtained for laboratory studies. The patient tolerated the procedure well and there were no apparent complications. IMPRESSION: Successful diagnostic lumbar puncture with 9 mL of clear CSF obtained and sent to the laboratory for analysis. Electronically Signed   By: Maurine Simmering   On: 01/22/2021 09:46        Scheduled Meds: . famotidine  40 mg Oral Daily  . ferrous sulfate  325 mg Oral Q breakfast  . levETIRAcetam  1,000 mg Oral BID  . metoprolol succinate  12.5 mg Oral Daily  . senna-docusate  2 tablet Oral QHS  . venlafaxine XR  75 mg Oral Q breakfast   Continuous Infusions:  Aline August, MD [facility] 01/23/2021, 8:00 AM

## 2021-01-24 NOTE — Care Management (Signed)
Spoke w patient at bedside.  Referral made to [facility]. [facility] is out of stock for any kind of cane. Discussed with patient she may need to purchase at pharmacy or [place].

## 2021-01-24 NOTE — Discharge Summary (Signed)
Physician Discharge Summary  Stacy Jackson [id number] DOB: 1981-09-18 DOA: 01/21/2021  PCP: Mack Hook, MD  Admit date: 01/21/2021 Discharge date: 01/24/2021  Admitted From: Home Disposition: Home  Recommendations for Outpatient Follow-up:  1. Follow up with PCP in 1 week with repeat CBC/BMP 2. Outpatient follow-up with neurology and psychiatry 3. Follow up in ED if symptoms worsen or new appear   Home Health: No Equipment/Devices: None  Discharge Condition: Stable CODE STATUS: Full Diet recommendation: Regular  Brief/Interim Summary: 40 y.o.femalewith medical history significant forCIDP, seizure disorder,iron deficiency anemia, essential hypertension, polyneuropathy, GERD,chronic anxiety/depression,psoriatic arthritis presented with sudden onset of generalized weakness associated with difficulty walking.  On presentation, LP was attempted in the ED at [facility] unsuccessfully.  Neurology was consulted and she was transferred to [facility] for further management.  Patient underwent fluoroscopy guided LP on 01/22/2021.  Neurology evaluated the patient.  CSF work-up has been negative so far.  Neurology has cleared the patient for discharge.  Patient has tolerated physical therapy and her weakness is improving.  PT recommends outpatient PT follow-up.  She will be discharged home with outpatient follow-up with neurology and psychiatry.  Discharge Diagnoses:   Generalized weakness, unclear etiology -Sudden onset, no clear evidence of active infectious process -Neurology following.  Status post fluoroscopy guided LP on 01/22/2021.  MRI of brain and cervical spine were normal. -CSF work-up has been negative so far.  Neurology has cleared the patient for discharge and thinks that her symptoms might be functional as well.  Patient has tolerated physical therapy and her weakness is improving.  PT recommends outpatient PT follow-up.  She will be discharged home with  outpatient follow-up with neurology and psychiatry.  Possible mild pancreatitis seen on CT scan -Abdominal exam is benign with normal LFTs. -Tolerating diet.  Doubt that patient is any clinical signs of pancreatitis  Seizure disorder-continue home Keppra.    Outpatient follow-up with neurology  Chronic microcytic anemia, history of iron deficiency anemia -Hemoglobin stable.  Continue iron supplementation  Essential hypertension -Continue Toprol-XL  Chronic anxiety/depression -Continue Effexor  GERD -Continue famotidine   Discharge Instructions  Discharge Instructions    Ambulatory referral to Neurology   Complete by: As directed    An appointment is requested in approximately: few weeks for hospital followup   Ambulatory referral to Physical Therapy   Complete by: As directed    Diet general   Complete by: As directed    Increase activity slowly   Complete by: As directed      Allergies as of 01/24/2021      Reactions   Shrimp [shellfish Allergy] Anaphylaxis      Medication List    STOP taking these medications   meloxicam 15 MG tablet Commonly known as: MOBIC     TAKE these medications   busPIRone 5 MG tablet Commonly known as: BUSPAR Take 1 tablet (5 mg total) by mouth 3 (three) times daily.   clobetasol ointment 0.05 % Commonly known as: TEMOVATE Apply to affected areas twice daily as needed   cyanocobalamin 100 MCG tablet Take 1,000 mcg by mouth daily.   cyclobenzaprine 10 MG tablet Commonly known as: FLEXERIL Take 10 mg by mouth at bedtime as needed for muscle spasms (alternates with tizanidine).   dicyclomine 10 MG capsule Commonly known as: BENTYL 1 cap by mouth every 8 hours as needed What changed:   how much to take  how to take this  when to take this  reasons to take this  additional instructions   EPINEPHrine 0.3 mg/0.3 mL Soaj injection Commonly known as: EpiPen 2-Pak Inject 0.3 mg into the muscle as needed for  anaphylaxis.   famotidine 20 MG tablet Commonly known as: PEPCID 3 tablets by mouth 1 hour before breakfast What changed:   how much to take  how to take this  when to take this  additional instructions   fexofenadine 180 MG tablet Commonly known as: ALLEGRA 1 tab by mouth daily as needed What changed:   how much to take  how to take this  when to take this  reasons to take this  additional instructions   gabapentin 400 MG capsule Commonly known as: NEURONTIN TAKE 1 CAPSULE BY MOUTH THREE TIMES DAILY What changed:   how much to take  how to take this  when to take this  additional instructions   Iron Polysacch Cmplx-B12-FA 150-0.025-1 MG Caps 1 cap by mouth daily What changed:   how much to take  how to take this  when to take this  additional instructions   levETIRAcetam 1000 MG tablet Commonly known as: KEPPRA Take 1 tablet by mouth twice daily   methotrexate 2.5 MG tablet Commonly known as: RHEUMATREX Take 7.5 mg by mouth once a week. Sundays or Mondays   metoprolol succinate 25 MG 24 hr tablet Commonly known as: TOPROL-XL Take 2 tablets (50 mg total) by mouth daily. Take with or immediately following a meal.   naproxen sodium 220 MG tablet Commonly known as: ALEVE Take 220 mg by mouth daily as needed (pain).   tiZANidine 4 MG tablet Commonly known as: ZANAFLEX Take 2-4 mg by mouth at bedtime as needed for muscle spasms (alternates with cyclobenzaprine).   Tremfya 100 MG/ML Sosy Generic drug: Guselkumab Inject 1 mL into the skin every 8 (eight) weeks.   venlafaxine XR 75 MG 24 hr capsule Commonly known as: Effexor XR Take 1 capsule (75 mg total) by mouth daily with breakfast.       Follow-up Information    Mack Hook, MD. Schedule an appointment as soon as possible for a visit in 1 week(s).   Specialty: Internal Medicine Contact information: [place] [address] [phone]         psychiatrist. Schedule an appointment as soon as possible for a visit in 1 week(s).              Allergies  Allergen Reactions  . Shrimp [Shellfish Allergy] Anaphylaxis    Consultations:  Neurology   Procedures/Studies: CT Head Wo Contrast  Result Date: 01/21/2021 CLINICAL DATA:  Weakness. EXAM: CT HEAD WITHOUT CONTRAST TECHNIQUE: Contiguous axial images were obtained from the base of the skull through the vertex without intravenous contrast. COMPARISON:  None. FINDINGS: Brain: No evidence of acute infarction, hemorrhage, hydrocephalus, extra-axial collection or mass lesion/mass effect. Vascular: No hyperdense vessel or unexpected calcification. Skull: Normal. Negative for fracture or focal lesion. Sinuses/Orbits: No acute finding. Other: None. IMPRESSION: No acute intracranial abnormality. Electronically Signed   By: Virgina Norfolk M.D.   On: 01/21/2021 21:13   MR Brain W and Wo Contrast  Result Date: 01/22/2021 CLINICAL DATA:  Upper and lower extremity weakness EXAM: MRI HEAD WITHOUT AND WITH CONTRAST MRI CERVICAL SPINE WITHOUT AND WITH CONTRAST TECHNIQUE: Multiplanar, multiecho pulse sequences of the brain and surrounding structures, and cervical spine, to include the craniocervical junction and cervicothoracic junction, were obtained without and with intravenous contrast. CONTRAST:  36mL GADAVIST GADOBUTROL 1 MMOL/ML IV SOLN COMPARISON:  None. FINDINGS: MRI HEAD FINDINGS Brain: No acute infarct, mass effect or extra-axial collection. No acute or chronic hemorrhage. Normal white matter signal, parenchymal volume and CSF spaces. The midline structures are normal. There is no abnormal contrast enhancement. Vascular: Major flow voids are preserved. Skull and upper cervical spine: Normal calvarium and skull base. Visualized upper cervical spine and soft tissues are normal. Sinuses/Orbits:No paranasal sinus fluid levels or advanced mucosal thickening. No mastoid or middle ear effusion. Normal  orbits. MRI CERVICAL SPINE FINDINGS Alignment: Physiologic. Vertebrae: No fracture, evidence of discitis, or bone lesion. Cord: Normal signal and morphology. No abnormal contrast enhancement. Posterior Fossa, vertebral arteries, paraspinal tissues: Negative. Disc levels: No disc herniation or stenosis. IMPRESSION: Normal MRI of the brain and cervical spine. Electronically Signed   By: Ulyses Jarred M.D.   On: 01/22/2021 03:39   MR Cervical Spine W or Wo Contrast  Result Date: 01/22/2021 CLINICAL DATA:  Upper and lower extremity weakness EXAM: MRI HEAD WITHOUT AND WITH CONTRAST MRI CERVICAL SPINE WITHOUT AND WITH CONTRAST TECHNIQUE: Multiplanar, multiecho pulse sequences of the brain and surrounding structures, and cervical spine, to include the craniocervical junction and cervicothoracic junction, were obtained without and with intravenous contrast. CONTRAST:  33mL GADAVIST GADOBUTROL 1 MMOL/ML IV SOLN COMPARISON:  None. FINDINGS: MRI HEAD FINDINGS Brain: No acute infarct, mass effect or extra-axial collection. No acute or chronic hemorrhage. Normal white matter signal, parenchymal volume and CSF spaces. The midline structures are normal. There is no abnormal contrast enhancement. Vascular: Major flow voids are preserved. Skull and upper cervical spine: Normal calvarium and skull base. Visualized upper cervical spine and soft tissues are normal. Sinuses/Orbits:No paranasal sinus fluid levels or advanced mucosal thickening. No mastoid or middle ear effusion. Normal orbits. MRI CERVICAL SPINE FINDINGS Alignment: Physiologic. Vertebrae: No fracture, evidence of discitis, or bone lesion. Cord: Normal signal and morphology. No abnormal contrast enhancement. Posterior Fossa, vertebral arteries, paraspinal tissues: Negative. Disc levels: No disc herniation or stenosis. IMPRESSION: Normal MRI of the brain and cervical spine. Electronically Signed   By: Ulyses Jarred M.D.   On: 01/22/2021 03:39   CT ABDOMEN PELVIS W  CONTRAST  Result Date: 01/21/2021 CLINICAL DATA:  Epigastric pain EXAM: CT ABDOMEN AND PELVIS WITH CONTRAST TECHNIQUE: Multidetector CT imaging of the abdomen and pelvis was performed using the standard protocol following bolus administration of intravenous contrast. CONTRAST:  186mL OMNIPAQUE IOHEXOL 300 MG/ML  SOLN COMPARISON:  None. FINDINGS: Lower chest: Lung bases demonstrate no acute consolidation or effusion. Normal cardiac size. Hepatobiliary: No calcified gallstone. No biliary dilatation. 14 mm enhancing lesion within the posterior right hepatic lobe, series 2, image 21. Pancreas: No ductal dilatation. Questionable mild fluid around the pancreas uncinate process. Spleen: Normal in size without focal abnormality. Adrenals/Urinary Tract: Adrenal glands are unremarkable. Kidneys are normal, without renal calculi, focal lesion, or hydronephrosis. Bladder is unremarkable. Stomach/Bowel: Status post gastric bypass. No dilated small bowel. No acute bowel wall thickening. Vascular/Lymphatic: No significant vascular findings are present. No enlarged abdominal or pelvic lymph nodes. Reproductive: Status post hysterectomy.  No adnexal mass Other: Negative for free air or free fluid. Musculoskeletal: No acute or significant osseous findings. IMPRESSION: 1. Questionable small amount of fluid at the uncinate process of the pancreas, suggest correlation with enzymes to exclude pancreatitis. 2. Status post gastric bypass without evidence for bowel obstruction. 3. 14 mm enhancing lesion within the posterior right hepatic lobe possibly a hemangioma though consider confirmation with nonemergent/outpatient MRI. Electronically Signed   By: Donavan Foil  M.D.   On: 01/21/2021 23:28   ECHOCARDIOGRAM COMPLETE  Result Date: 01/15/2021    ECHOCARDIOGRAM REPORT   Patient Name:   [facility] Date of Exam: 01/15/2021 Medical Rec #:  [id number]               Height:       59.0 in Accession #:    [id number]               Weight:       135.0 lb Date of Birth:  [birth year]              BSA:          1.561 m Patient Age:    41 years                BP:           104/76 mmHg Patient Gender: F                       HR:           79 bpm. Exam Location:  [facility] Procedure: 2D Echo, 3D Echo, Cardiac Doppler and Color Doppler Indications:    R06.00 Dyspnea  History:        Patient has no prior history of Echocardiogram examinations.                 Stroke, Signs/Symptoms:Dyspnea, Chest Pain, Shortness of Breath                 and Dizziness/Lightheadedness; Risk Factors:Diabetes. COVID                 (August 2020, with residual SOB, Palpitations, and Chest Pain).  Sonographer:    Deliah Boston RDCS Referring Phys: [place]  1. Left ventricular ejection fraction, by estimation, is 60 to 65%. The left ventricle has normal function. The left ventricle has no regional wall motion abnormalities. Left ventricular diastolic parameters were normal. The average left ventricular global longitudinal strain is 22.3 %. The global longitudinal strain is normal.  2. Right ventricular systolic function is normal. The right ventricular size is normal. There is normal pulmonary artery systolic pressure.  3. Left atrial size was mildly dilated.  4. The mitral valve is normal in structure. No evidence of mitral valve regurgitation. No evidence of mitral stenosis.  5. The aortic valve is grossly normal. Aortic valve regurgitation is not visualized. No aortic stenosis is present.  6. The inferior vena cava is normal in size with greater than 50% respiratory variability, suggesting right atrial pressure of 3 mmHg. Comparison(s): No prior Echocardiogram. Conclusion(s)/Recommendation(s): Normal biventricular function without evidence of hemodynamically significant valvular heart disease. FINDINGS  Left Ventricle: Left ventricular ejection fraction, by estimation, is 60 to 65%. The left ventricle has normal function. The left ventricle  has no regional wall motion abnormalities. The average left ventricular global longitudinal strain is 22.3 %. The  global longitudinal strain is normal. 3D left ventricular ejection fraction analysis performed but not reported based on interpreter judgement due to suboptimal quality. The left ventricular internal cavity size was normal in size. There is no left ventricular hypertrophy. Left ventricular diastolic parameters were normal. Right Ventricle: The right ventricular size is normal. No increase in right ventricular wall thickness. Right ventricular systolic function is normal. There is normal pulmonary artery systolic pressure. The tricuspid regurgitant velocity is 2.26 m/s, and  with an assumed right atrial pressure of  3 mmHg, the estimated right ventricular systolic pressure is 0000000 mmHg. Left Atrium: Left atrial size was mildly dilated. Right Atrium: Right atrial size was normal in size. Pericardium: There is no evidence of pericardial effusion. Mitral Valve: The mitral valve is normal in structure. No evidence of mitral valve regurgitation. No evidence of mitral valve stenosis. Tricuspid Valve: The tricuspid valve is normal in structure. Tricuspid valve regurgitation is trivial. No evidence of tricuspid stenosis. Aortic Valve: The aortic valve is grossly normal. Aortic valve regurgitation is not visualized. No aortic stenosis is present. Pulmonic Valve: The pulmonic valve was not well visualized. Pulmonic valve regurgitation is not visualized. No evidence of pulmonic stenosis. Aorta: The aortic root, ascending aorta, aortic arch and descending aorta are all structurally normal, with no evidence of dilitation or obstruction. Venous: The inferior vena cava is normal in size with greater than 50% respiratory variability, suggesting right atrial pressure of 3 mmHg. IAS/Shunts: The atrial septum is grossly normal.  LEFT VENTRICLE PLAX 2D LVIDd:         3.75 cm  Diastology LVIDs:         2.90 cm  LV e' medial:     9.03 cm/s LV PW:         0.80 cm  LV E/e' medial:  9.0 LV IVS:        0.70 cm  LV e' lateral:   14.30 cm/s LVOT diam:     2.30 cm  LV E/e' lateral: 5.7 LV SV:         89 LV SV Index:   57       2D Longitudinal Strain LVOT Area:     4.15 cm 2D Strain GLS (A2C):   21.7 %                         2D Strain GLS (A3C):   19.6 %                         2D Strain GLS (A4C):   25.7 %                         2D Strain GLS Avg:     22.3 %                          3D Volume EF:                         3D EF:        73 %                         LV EDV:       121 ml                         LV ESV:       32 ml                         LV SV:        88 ml RIGHT VENTRICLE RV S prime:     16.40 cm/s TAPSE (M-mode): 2.0 cm LEFT ATRIUM             Index       RIGHT ATRIUM  Index LA diam:        3.00 cm 1.92 cm/m  RA Area:     13.00 cm LA Vol (A2C):   47.2 ml 30.24 ml/m RA Volume:   30.00 ml  19.22 ml/m LA Vol (A4C):   33.1 ml 21.21 ml/m LA Biplane Vol: 40.6 ml 26.02 ml/m  AORTIC VALVE LVOT Vmax:   113.00 cm/s LVOT Vmean:  67.900 cm/s LVOT VTI:    0.215 m  AORTA Ao Root diam: 3.10 cm Ao Asc diam:  3.00 cm MITRAL VALVE               TRICUSPID VALVE MV Area (PHT): cm         TR Peak grad:   20.4 mmHg MV Decel Time: 254 msec    TR Vmax:        226.00 cm/s MV E velocity: 81.40 cm/s MV A velocity: 63.80 cm/s  SHUNTS MV E/A ratio:  1.28        Systemic VTI:  0.22 m                            Systemic Diam: 2.30 cm Buford Dresser MD Electronically signed by Buford Dresser MD Signature Date/Time: 01/15/2021/5:11:47 PM    Final    DG FL GUIDED LUMBAR PUNCTURE  Result Date: 01/22/2021 CLINICAL DATA:  Weakness since yesterday EXAM: DIAGNOSTIC LUMBAR PUNCTURE UNDER FLUOROSCOPIC GUIDANCE COMPARISON:  CT abdomen pelvis 01/21/2021 FLUOROSCOPY TIME:  Fluoroscopy Time:  6 seconds Radiation Exposure Index (if provided by the fluoroscopic device): 0.6 mGy Number of Acquired Spot Images: 1 PROCEDURE: Informed consent was  obtained from the patient prior to the procedure, including potential complications of headache, allergy, and pain. With the patient prone, the lower back was prepped with Betadine. 1% Lidocaine was used for local anesthesia. Lumbar puncture was performed at the L4-L5 level using a 22 gauge needle with return of clear CSF. Opening pressure was not obtained. 9 ml of CSF were obtained for laboratory studies. The patient tolerated the procedure well and there were no apparent complications. IMPRESSION: Successful diagnostic lumbar puncture with 9 mL of clear CSF obtained and sent to the laboratory for analysis. Electronically Signed   By: Maurine Simmering   On: 01/22/2021 09:46       Subjective: Patient seen and examined at bedside.  Feels that her weakness is better.  Still slightly weak in her lower extremities as per her.  No overnight fever or vomiting reported.  Discharge Exam: Vitals:   01/23/21 2115 01/24/21 0626  BP: (!) 122/99 (!) 129/97  Pulse: 86 74  Resp: 18 18  Temp: 98.3 F (36.8 C) 97.9 F (36.6 C)  SpO2: 100% 100%    General: Pt is alert, awake, not in acute distress Cardiovascular: rate controlled, S1/S2 + Respiratory: bilateral decreased breath sounds at bases Abdominal: Soft, NT, ND, bowel sounds + Extremities: no edema, no cyanosis    The results of significant diagnostics from this hospitalization (including imaging, microbiology, ancillary and laboratory) are listed below for reference.     Microbiology: Recent Results (from the past 240 hour(s))  Resp Panel by RT-PCR (Flu A&B, Covid) Nasopharyngeal Swab     Status: None   Collection Time: 01/21/21 11:30 PM   Specimen: Nasopharyngeal Swab; Nasopharyngeal(NP) swabs in vial transport medium  Result Value Ref Range Status   SARS Coronavirus 2 by RT PCR NEGATIVE NEGATIVE Final    Comment: (NOTE) SARS-CoV-2 target nucleic  acids are NOT DETECTED.  The SARS-CoV-2 RNA is generally detectable in upper  respiratory specimens during the acute phase of infection. The lowest concentration of SARS-CoV-2 viral copies this assay can detect is 138 copies/mL. A negative result does not preclude SARS-Cov-2 infection and should not be used as the sole basis for treatment or other patient management decisions. A negative result may occur with  improper specimen collection/handling, submission of specimen other than nasopharyngeal swab, presence of viral mutation(s) within the areas targeted by this assay, and inadequate number of viral copies(<138 copies/mL). A negative result must be combined with clinical observations, patient history, and epidemiological information. The expected result is Negative.  Fact Sheet for Patients:  BloggerCourse.com  Fact Sheet for Healthcare Providers:  SeriousBroker.it  This test is no t yet approved or cleared by the [place] FDA and  has been authorized for detection and/or diagnosis of SARS-CoV-2 by FDA under an Emergency Use Authorization (EUA). This EUA will remain  in effect (meaning this test can be used) for the duration of the COVID-19 declaration under Section 564(b)(1) of the Act, 21 U.S.C.section 360bbb-3(b)(1), unless the authorization is terminated  or revoked sooner.       Influenza A by PCR NEGATIVE NEGATIVE Final   Influenza B by PCR NEGATIVE NEGATIVE Final    Comment: (NOTE) The Xpert Xpress SARS-CoV-2/FLU/RSV plus assay is intended as an aid in the diagnosis of influenza from Nasopharyngeal swab specimens and should not be used as a sole basis for treatment. Nasal washings and aspirates are unacceptable for Xpert Xpress SARS-CoV-2/FLU/RSV testing.  Fact Sheet for Patients: BloggerCourse.com  Fact Sheet for Healthcare Providers: SeriousBroker.it  This test is not yet approved or cleared by the [place] FDA and has been  authorized for detection and/or diagnosis of SARS-CoV-2 by FDA under an Emergency Use Authorization (EUA). This EUA will remain in effect (meaning this test can be used) for the duration of the COVID-19 declaration under Section 564(b)(1) of the Act, 21 U.S.C. section 360bbb-3(b)(1), unless the authorization is terminated or revoked.  Performed at [facility], [address] [place], [place] [address]   Urine culture     Status: None   Collection Time: 01/22/21  1:17 AM   Specimen: Urine, Random  Result Value Ref Range Status   Specimen Description URINE, RANDOM  Final   Special Requests NONE  Final   Culture   Final    Consistent with normal respiratory flora. Performed at [facility], [address] [place], [place] [address]    Report Status 01/23/2021 FINAL  Final  CSF culture w Gram Stain     Status: None (Preliminary result)   Collection Time: 01/22/21  9:28 AM   Specimen: PATH Cytology CSF; Cerebrospinal Fluid  Result Value Ref Range Status   Specimen Description CSF  Final   Special Requests CSF  Final   Gram Stain NO WBC SEEN NO ORGANISMS SEEN CYTOSPIN SMEAR   Final   Culture   Final    NO GROWTH 2 DAYS Performed at [facility], [address], [place], [place] [address]    Report Status PENDING  Incomplete  Culture, fungus without smear     Status: None (Preliminary result)   Collection Time: 01/22/21  9:28 AM   Specimen: PATH Cytology CSF; Cerebrospinal Fluid  Result Value Ref Range Status   Specimen Description CSF  Final   Special Requests NONE  Final   Culture  Final    NO FUNGUS ISOLATED AFTER 1 DAY Performed at [facility], [place] [address], [place], [place] [address]    Report Status PENDING  Incomplete     Labs: BNP (last 3 results) Recent Labs    12/16/20 0000  BNP 36.1   Basic Metabolic Panel: Recent Labs  Lab 01/21/21 2111 01/22/21 2134  NA 139 133*  K 4.0 3.9  CL 102 100  CO2 24 26  GLUCOSE 93  147*  BUN 10 11  CREATININE 0.57 0.72  CALCIUM 8.4* 8.4*  MG  --  2.1  PHOS  --  2.9   Liver Function Tests: Recent Labs  Lab 01/21/21 2111 01/22/21 0035 01/22/21 2134  AST 30  --  28  ALT 21  --  24  ALKPHOS 56  --  60  BILITOT 0.3  --  0.8  PROT 6.7  --  6.3*  ALBUMIN 3.7 3.6* 3.3*   Recent Labs  Lab 01/21/21 2111 01/22/21 2134  LIPASE 196* 190*   No results for input(s): AMMONIA in the last 168 hours. CBC: Recent Labs  Lab 01/21/21 2111 01/22/21 2134  WBC 4.8 3.9*  NEUTROABS 2.7  --   HGB 9.9* 9.0*  HCT 30.3* 27.9*  MCV 73.7* 75.0*  PLT 261 229   Cardiac Enzymes: No results for input(s): CKTOTAL, CKMB, CKMBINDEX, TROPONINI in the last 168 hours. BNP: Invalid input(s): POCBNP CBG: No results for input(s): GLUCAP in the last 168 hours. D-Dimer No results for input(s): DDIMER in the last 72 hours. Hgb A1c No results for input(s): HGBA1C in the last 72 hours. Lipid Profile No results for input(s): CHOL, HDL, LDLCALC, TRIG, CHOLHDL, LDLDIRECT in the last 72 hours. Thyroid function studies No results for input(s): TSH, T4TOTAL, T3FREE, THYROIDAB in the last 72 hours.  Invalid input(s): FREET3 Anemia work up No results for input(s): VITAMINB12, FOLATE, FERRITIN, TIBC, IRON, RETICCTPCT in the last 72 hours. Urinalysis    Component Value Date/Time   COLORURINE YELLOW 01/22/2021 0117   APPEARANCEUR CLEAR 01/22/2021 0117   LABSPEC >1.046 (H) 01/22/2021 0117   PHURINE 6.0 01/22/2021 0117   GLUCOSEU NEGATIVE 01/22/2021 0117   HGBUR NEGATIVE 01/22/2021 0117   BILIRUBINUR NEGATIVE 01/22/2021 0117   BILIRUBINUR neg 04/30/2019 1313   KETONESUR NEGATIVE 01/22/2021 0117   PROTEINUR NEGATIVE 01/22/2021 0117   UROBILINOGEN 0.2 04/30/2019 1313   NITRITE NEGATIVE 01/22/2021 0117   LEUKOCYTESUR NEGATIVE 01/22/2021 0117   Sepsis Labs Invalid input(s): PROCALCITONIN,  WBC,  LACTICIDVEN Microbiology Recent Results (from the past 240 hour(s))  Resp Panel by RT-PCR  (Flu A&B, Covid) Nasopharyngeal Swab     Status: None   Collection Time: 01/21/21 11:30 PM   Specimen: Nasopharyngeal Swab; Nasopharyngeal(NP) swabs in vial transport medium  Result Value Ref Range Status   SARS Coronavirus 2 by RT PCR NEGATIVE NEGATIVE Final    Comment: (NOTE) SARS-CoV-2 target nucleic acids are NOT DETECTED.  The SARS-CoV-2 RNA is generally detectable in upper respiratory specimens during the acute phase of infection. The lowest concentration of SARS-CoV-2 viral copies this assay can detect is 138 copies/mL. A negative result does not preclude SARS-Cov-2 infection and should not be used as the sole basis for treatment or other patient management decisions. A negative result may occur with  improper specimen collection/handling, submission of specimen other than nasopharyngeal swab, presence of viral mutation(s) within the areas targeted by this assay, and inadequate number of viral copies(<138 copies/mL). A negative result must be combined with  clinical observations, patient history, and epidemiological information. The expected result is Negative.  Fact Sheet for Patients:  EntrepreneurPulse.com.au  Fact Sheet for Healthcare Providers:  IncredibleEmployment.be  This test is no t yet approved or cleared by the [place] FDA and  has been authorized for detection and/or diagnosis of SARS-CoV-2 by FDA under an Emergency Use Authorization (EUA). This EUA will remain  in effect (meaning this test can be used) for the duration of the COVID-19 declaration under Section 564(b)(1) of the Act, 21 U.S.C.section 360bbb-3(b)(1), unless the authorization is terminated  or revoked sooner.       Influenza A by PCR NEGATIVE NEGATIVE Final   Influenza B by PCR NEGATIVE NEGATIVE Final    Comment: (NOTE) The Xpert Xpress SARS-CoV-2/FLU/RSV plus assay is intended as an aid in the diagnosis of influenza from Nasopharyngeal swab specimens  and should not be used as a sole basis for treatment. Nasal washings and aspirates are unacceptable for Xpert Xpress SARS-CoV-2/FLU/RSV testing.  Fact Sheet for Patients: EntrepreneurPulse.com.au  Fact Sheet for Healthcare Providers: IncredibleEmployment.be  This test is not yet approved or cleared by the [place] FDA and has been authorized for detection and/or diagnosis of SARS-CoV-2 by FDA under an Emergency Use Authorization (EUA). This EUA will remain in effect (meaning this test can be used) for the duration of the COVID-19 declaration under Section 564(b)(1) of the Act, 21 U.S.C. section 360bbb-3(b)(1), unless the authorization is terminated or revoked.  Performed at [facility], [address] [place], [place] [address]   Urine culture     Status: None   Collection Time: 01/22/21  1:17 AM   Specimen: Urine, Random  Result Value Ref Range Status   Specimen Description URINE, RANDOM  Final   Special Requests NONE  Final   Culture   Final    Consistent with normal respiratory flora. Performed at [facility], [place] [address] [place], [place] [address]    Report Status 01/23/2021 FINAL  Final  CSF culture w Gram Stain     Status: None (Preliminary result)   Collection Time: 01/22/21  9:28 AM   Specimen: PATH Cytology CSF; Cerebrospinal Fluid  Result Value Ref Range Status   Specimen Description CSF  Final   Special Requests CSF  Final   Gram Stain NO WBC SEEN NO ORGANISMS SEEN CYTOSPIN SMEAR   Final   Culture   Final    NO GROWTH 2 DAYS Performed at [facility], [place] [address] [place], [place] [address]    Report Status PENDING  Incomplete  Culture, fungus without smear     Status: None (Preliminary result)   Collection Time: 01/22/21  9:28 AM   Specimen: PATH Cytology CSF; Cerebrospinal Fluid  Result Value Ref Range Status   Specimen Description CSF  Final   Special Requests NONE  Final    Culture   Final    NO FUNGUS ISOLATED AFTER 1 DAY Performed at [facility], [place] [address] [place],  [address]    Report Status PENDING  Incomplete     Time coordinating discharge: 35 minutes  SIGNED:   Aline August, MD  [facility] 01/24/2021, 10:30 AM

## 2021-01-25 LAB — CSF CULTURE W GRAM STAIN
Culture: NO GROWTH
Gram Stain: NONE SEEN

## 2021-01-25 LAB — HSV(HERPES SMPLX VRS)ABS-I+II(IGG)-CSF: HSV Type I/II Ab, IgG CSF: 0.86 IV (ref <=0.89)

## 2021-01-26 LAB — OLIGOCLONAL BANDS, CSF + SERM

## 2021-01-27 LAB — HSV 1/2 PCR, CSF
HSV-1 DNA: NEGATIVE
HSV-2 DNA: NEGATIVE

## 2021-02-01 NOTE — Progress Notes (Deleted)
Cardiology Office Note   Date:  02/01/2021   ID:  Stacy Jackson, DOB 1980-12-04, MRN [id number]  PCP:  Stacy Hook, MD  Cardiologist:   None Referring:  Stacy Hook, MD  No chief complaint on file.     History of Present Illness: Stacy Jackson is a 40 y.o. female who was referred by Stacy Hook, MD fo evaluation of palpitations.   An echo was unremarkable.  ***  ***  The patient has not had any prior cardiac history.  She states she has not felt well since August 2020 when she had COVID.  She states she was short of breath and very weak with that.  Since then she feels like she cannot take a deep breath.  She gets short of breath with activities.  Her chest hurts when she is trying to do stuff.  It is a sharp discomfort.  She is not describing cough fevers or chills.  She sleeps on 3 pillows but is not describing classic PND or orthopnea.  She does not have any leg swelling.  She feels her heart racing.  This happens when she is having chest discomfort more spontaneously.  Her water alert her that her rate might be greater than 120.  It lasts for 20 to 60 seconds.  It happens at rest.  It comes and goes spontaneously.  She was started on metoprolol but she does not know and thinks that has helped.  She is not had any syncope.  She has never had this kind of discomfort before.  She does not have any prior cardiac testing.  Past Medical History:  Diagnosis Date  . Abdominal pain, chronic, right lower quadrant   . Cervical cancer ([facility]) 2015   Unsuccessful LEEP, then TAH  . 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 Complicated migraine 8242   Left arm numbness, states she may have had a "mini-stroke"  . Diabetes mellitus without complication ([place])   . Diverticulosis 02/15/2017  . GERD (gastroesophageal reflux disease)    with hiatal hernia.   fundoplication in 3536  . History of obesity 2010  . Ovarian cyst right   2008  . 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 Seizures (Stacy Jackson)   . Sickle cell anemia ([facility])   . Stroke [facility])     Past Surgical History:  Procedure Laterality Date  . CESAREAN SECTION  2003, 2008   2nd with left oophorectomy  . COLONOSCOPY WITH PROPOFOL N/A 11/22/2016   Procedure: COLONOSCOPY WITH PROPOFOL;  Surgeon: Arta Silence, MD;  Location: [facility] ENDOSCOPY;  Service: Endoscopy;  Laterality: N/A;  . ESOPHAGOGASTRODUODENOSCOPY  01/13/2015   Normal biopsy of gastric mucosa/gastric pouch  . NISSEN FUNDOPLICATION  1443  . OOPHORECTOMY Left 2008   Done at same time of Csection  . 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  Stacy Jackson VAGINAL HYSTERECTOMY  2015   For cervical cancer unresponsive to LEEP     Current Outpatient Medications  Medication Sig Dispense Refill  . busPIRone (BUSPAR) 5 MG tablet Take 1 tablet (5 mg total) by mouth 3 (three) times daily. 90 tablet 4  . clobetasol ointment (TEMOVATE) 0.05 % Apply to affected areas twice daily as needed 60 g 2  . cyanocobalamin 100 MCG tablet Take 1,000 mcg by mouth daily.    Stacy Jackson  cyclobenzaprine (FLEXERIL) 10 MG tablet Take 10 mg by mouth at bedtime as needed for muscle spasms (alternates with tizanidine).    Stacy Jackson dicyclomine (BENTYL) 10 MG capsule 1 cap by mouth every 8 hours as needed (Patient taking differently: Take 10 mg by mouth every 8 (eight) hours as needed for spasms.) 60 capsule 2  . EPINEPHrine (EPIPEN 2-PAK) 0.3 mg/0.3 mL IJ SOAJ injection Inject 0.3 mg into the muscle as needed for anaphylaxis. 1 each 1  . famotidine (PEPCID) 20 MG tablet 3 tablets by mouth 1 hour before breakfast (Patient taking differently: Take 60 mg by mouth daily before breakfast.) 90 tablet 11  . fexofenadine (ALLEGRA) 180 MG tablet 1 tab by mouth daily as needed (Patient  taking differently: Take 180 mg by mouth as needed for allergies.) 30 tablet 11  . gabapentin (NEURONTIN) 400 MG capsule TAKE 1 CAPSULE BY MOUTH THREE TIMES DAILY (Patient taking differently: Take 400 mg by mouth 3 (three) times daily.) 90 capsule 11  . Iron Polysacch Cmplx-B12-FA 150-0.025-1 MG CAPS 1 cap by mouth daily (Patient taking differently: Take 1 capsule by mouth daily.) 30 capsule 11  . levETIRAcetam (KEPPRA) 1000 MG tablet Take 1 tablet by mouth twice daily (Patient taking differently: Take 1,000 mg by mouth 2 (two) times daily.) 60 tablet 10  . methotrexate (RHEUMATREX) 2.5 MG tablet Take 7.5 mg by mouth once a week. Sundays or Mondays    . metoprolol succinate (TOPROL-XL) 25 MG 24 hr tablet Take 2 tablets (50 mg total) by mouth daily. Take with or immediately following a meal. 30 tablet 11  . naproxen sodium (ALEVE) 220 MG tablet Take 220 mg by mouth daily as needed (pain).    Stacy Jackson tiZANidine (ZANAFLEX) 4 MG tablet Take 2-4 mg by mouth at bedtime as needed for muscle spasms (alternates with cyclobenzaprine).    Stacy Jackson TREMFYA 100 MG/ML SOSY Inject 1 mL into the skin every 8 (eight) weeks.    Stacy Jackson venlafaxine XR (EFFEXOR XR) 75 MG 24 hr capsule Take 1 capsule (75 mg total) by mouth daily with breakfast. 60 capsule 2   No current facility-administered medications for this visit.    Allergies:   Shrimp [shellfish allergy]    ROS:  Please see the history of present illness.   Otherwise, review of systems are positive for ***.   All other systems are reviewed and negative.    PHYSICAL EXAM: VS:  LMP 12/14/2012  , BMI There is no height or weight on file to calculate BMI. GENERAL:  Well appearing NECK:  No jugular venous distention, waveform within normal limits, carotid upstroke brisk and symmetric, no bruits, no thyromegaly LUNGS:  Clear to auscultation bilaterally CHEST:  Unremarkable HEART:  PMI not displaced or sustained,S1 and S2 within normal limits, no S3, no S4, no clicks, no rubs,  *** murmurs ABD:  Flat, positive bowel sounds normal in frequency in pitch, no bruits, no rebound, no guarding, no midline pulsatile mass, no hepatomegaly, no splenomegaly EXT:  2 plus pulses throughout, no edema, no cyanosis no clubbing     ***GENERAL:  Well appearing HEENT:  Pupils equal round and reactive, fundi not visualized, oral mucosa unremarkable NECK:  No jugular venous distention, waveform within normal limits, carotid upstroke brisk and symmetric, no bruits, no thyromegaly LYMPHATICS:  No cervical, inguinal adenopathy LUNGS:  Clear to auscultation bilaterally BACK:  No CVA tenderness CHEST:  Unremarkable HEART:  PMI not displaced or sustained,S1 and S2 within normal limits, no S3, no S4, no  clicks, no rubs, no murmurs ABD:  Flat, positive bowel sounds normal in frequency in pitch, no bruits, no rebound, no guarding, no midline pulsatile mass, no hepatomegaly, no splenomegaly EXT:  2 plus pulses throughout, no edema, no cyanosis no clubbing SKIN:  No rashes no nodules NEURO:  Cranial nerves II through XII grossly intact, motor grossly intact throughout PSYCH:  Cognitively intact, oriented to person place and time    EKG:  EKG is *** ordered today. The ekg ordered today demonstrates sinus rhythm, rate ***, axis within normal limits, intervals within normal limits, no acute ST-T wave changes.  Poor anterior R wave progression possibly lead placement but could not exclude old anteroseptal MI.   Recent Labs: 10/29/2020: TSH 0.538 12/16/2020: BNP 22.2 01/22/2021: ALT 24; BUN 11; Creatinine, Ser 0.72; Hemoglobin 9.0; Magnesium 2.1; Platelets 229; Potassium 3.9; Sodium 133    Lipid Panel    Component Value Date/Time   CHOL 245 (H) 10/29/2020 1118   TRIG 56 10/29/2020 1118   HDL 113 10/29/2020 1118   LDLCALC 123 (H) 10/29/2020 1118      Wt Readings from Last 3 Encounters:  01/22/21 135 lb 9.3 oz (61.5 kg)  12/16/20 135 lb (61.2 kg)  10/29/20 144 lb 8 oz (65.5 kg)       Other studies Reviewed: Additional studies/ records that were reviewed today include: Labs. Review of the above records demonstrates:  Please see elsewhere in the note.     ASSESSMENT AND PLAN:  PALPITATIONS :  ***   The patient has tachypalpitations.  She will need a 2-week event monitor.  I do see that she has normal electrolytes and thyroid.  SOB:    BNP was normal .  ***  I will check a BNP level.  She will also need an echocardiogram.  CHEST PAIN:  Echo was unremarkable.  ***   I do not strongly suspect pericarditis myocarditis or obstructive coronary disease.  I will start with the testing as above and have a low threshold however for further testing such as a POET (Plain Old Exercise Treadmill)   Current medicines are reviewed at length with the patient today.  The patient does not have concerns regarding medicines.  The following changes have been made:  ***  Labs/ tests ordered today include:  ***  No orders of the defined types were placed in this encounter.    Disposition:   FU with me in ***   Signed, Minus Breeding, MD  02/01/2021 4:05 PM    [facility]

## 2021-02-03 ENCOUNTER — Telehealth: Payer: Self-pay

## 2021-02-03 ENCOUNTER — Ambulatory Visit: Payer: Medicaid Other | Admitting: Cardiology

## 2021-02-03 DIAGNOSIS — R072 Precordial pain: Secondary | ICD-10-CM

## 2021-02-03 DIAGNOSIS — R0602 Shortness of breath: Secondary | ICD-10-CM

## 2021-02-03 DIAGNOSIS — R002 Palpitations: Secondary | ICD-10-CM

## 2021-02-03 NOTE — Telephone Encounter (Signed)
Contacted patient in regards to missed appointment with Dr.Hochrein at 3:00 PM today.  Patient states that someone called her and told her she did not have an appointment but it was on 05/27 at 3:00 PM. I advised with patient that Dr.Hochrein did not have an afternoon schedule for this day, and asked if it was Cardiology that called. Patient states she believed it was. I did advise patient she could come on in for the appointment, but patient states she is unable to come now. I did advise I would cancel the appointment and have them call to get her rescheduled.  Patient verbalized understanding.

## 2021-02-04 ENCOUNTER — Other Ambulatory Visit: Payer: Self-pay

## 2021-02-04 ENCOUNTER — Encounter: Payer: Self-pay | Admitting: Physical Therapy

## 2021-02-04 ENCOUNTER — Ambulatory Visit: Payer: Medicaid Other | Attending: Internal Medicine | Admitting: Physical Therapy

## 2021-02-04 DIAGNOSIS — M6281 Muscle weakness (generalized): Secondary | ICD-10-CM | POA: Diagnosis present

## 2021-02-04 DIAGNOSIS — Z7409 Other reduced mobility: Secondary | ICD-10-CM | POA: Diagnosis not present

## 2021-02-04 DIAGNOSIS — M545 Low back pain, unspecified: Secondary | ICD-10-CM | POA: Diagnosis present

## 2021-02-04 DIAGNOSIS — M546 Pain in thoracic spine: Secondary | ICD-10-CM | POA: Insufficient documentation

## 2021-02-04 NOTE — Patient Instructions (Signed)
Access Code: [email or web address] URL: [email or web address] Date: 02/04/2021 Prepared by: Amador Cunas  Exercises Seated Upper Trapezius Stretch - 1 x daily - 7 x weekly - 2 sets - 2 reps - 15-20 sec hold Standing Shoulder Row with Anchored Resistance - 1 x daily - 7 x weekly - 2 sets - 10 reps Shoulder Extension with Resistance - 1 x daily - 7 x weekly - 2 sets - 10 reps Sit to Stand with Hands on Knees - 1 x daily - 7 x weekly - 2 sets - 5 reps

## 2021-02-04 NOTE — Therapy (Signed)
[place] [place], [place], [address] Phone: [phone]   Fax:  [phone]  Physical Therapy Evaluation  Patient Details  Name: Stacy Jackson MRN: [id number] Date of Birth: 1981-02-20 Referring Provider (PT): Starla Link   Encounter Date: 02/04/2021   PT End of Session - 02/04/21 1303    Visit Number 1    Date for PT Re-Evaluation 04/29/21    Authorization Type 27 total visits per year    PT Start Time 1108    PT Stop Time 1143    PT Time Calculation (min) 35 min    Activity Tolerance Patient limited by fatigue;Patient limited by pain    Behavior During Therapy [facility] for tasks assessed/performed           Past Medical History:  Diagnosis Date  . Abdominal pain, chronic, right lower quadrant   . Cervical cancer ([place]) 2015   Unsuccessful LEEP, then TAH  . 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 Complicated migraine 5956   Left arm numbness, states she may have had a "mini-stroke"  . Diabetes mellitus without complication ([place])   . Diverticulosis 02/15/2017  . GERD (gastroesophageal reflux disease)    with hiatal hernia.  fundoplication in 3875  . History of obesity 2010  . Ovarian cyst right   2008  . 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 Seizures ([place])   . Sickle cell anemia (HCC)   . Stroke [facility])     Past Surgical History:  Procedure Laterality Date  . CESAREAN SECTION  2003, 2008   2nd with left oophorectomy  . COLONOSCOPY WITH PROPOFOL N/A 11/22/2016   Procedure: COLONOSCOPY WITH PROPOFOL;  Surgeon: Arta Silence, MD;  Location: [facility] ENDOSCOPY;  Service: Endoscopy;  Laterality: N/A;  . ESOPHAGOGASTRODUODENOSCOPY  01/13/2015   Normal biopsy of gastric mucosa/gastric pouch  . NISSEN FUNDOPLICATION  6433  . OOPHORECTOMY Left 2008   Done at same time of Csection  .  ROUX-EN-Y GASTRIC BYPASS  ?with Nissen fundoplication   Unknown year--found in GI records from [facility] as bariatric procedure.  Marland Kitchen VAGINAL HYSTERECTOMY  2015   For cervical cancer unresponsive to LEEP    There were no vitals filed for this visit.    Subjective Assessment - 02/04/21 1111    Subjective Pt reports that she had recent hospitalization 5/4-5/7 for sudden onset weakness and difficulty walking. Pt states she was wearing a heart monitor and woke up from a nap to the heart monitor going off and she had difficulty getting up and moving. Went to ED from there. Full workup at hospital including CT of cervical/brain/abdomen, lumbar puncture, and labwork; inconclusive as to cause, CT scans [facility]. MD thought potential pancreatitis. Reports some residual N/T in hands and tip of tongue. Normal CN screen. States she has appt with PCP 02/05/21 and plans to discuss these symptoms. Pt reports that since hospitalization she has felt very weak and had trouble getting around house to do normal activities. States her mid thoracic/LBP has returned and is worsened with prolonged standing/walking. Pt reports she is using a SPC to get around now but has never had to use a cane before. States cannot stand/walk for more than 10 minutes at a time.    Pertinent History CIDP, sickle cell anemia, seizure disorder, HTN, anxiety/depression and psoriatic arthritis    How long can you stand  comfortably? ~10 min    How long can you walk comfortably? ~10 min    Diagnostic tests CT head/abdomen/cervical    Patient Stated Goals get back to walking without cane, walk a little longer, stand for prolonged periods    Currently in Pain? Yes    Pain Score 7     Pain Location Back    Pain Orientation Mid;Lower    Pain Descriptors / Indicators Aching    Pain Type Acute pain    Pain Onset More than a month ago    Pain Frequency Constant    Aggravating Factors  prolonged standing, walking    Pain Relieving Factors rest               [facility] PT Assessment - 02/04/21 0001      Assessment   Medical Diagnosis debility and thoracic/LBP    Referring Provider (PT) Starla Link    Onset Date/Surgical Date 01/21/21    Hand Dominance Right    Next MD Visit 02/05/2021    Prior Therapy PT for back pain      Precautions   Precautions None      Restrictions   Weight Bearing Restrictions No      Balance Screen   Has the patient fallen in the past 6 months Yes    How many times? 2   fell out of the bed while sleeping twice   Has the patient had a decrease in activity level because of a fear of falling?  No    Is the patient reluctant to leave their home because of a fear of falling?  No      Home Environment   Additional Comments pt reports has been avoiding stairs      Prior Function   Level of Independence Independent    Vocation Unemployed    Leisure walking, wants to be able to get out with friends/shopping again      Sensation   Light Touch Appears Intact    Additional Comments reported limited sensation RUE compared to LUE      Functional Tests   Functional tests Sit to Stand      Sit to Stand   Comments UEs on thighs, leaning to LLE, reports of mid/low back pain, difficulty with eccentric control      ROM / Strength   AROM / PROM / Strength AROM;Strength      AROM   Overall AROM Comments lumbar AROM 75% limited + pain      Strength   Overall Strength Comments BLE 4-/5 upon MMT; demos functional LE weakness and quick to fatigue      Flexibility   Soft Tissue Assessment /Muscle Length yes    Hamstrings tight    Piriformis tight      Palpation   Palpation comment tender to palpation mid thoracic paraspinals      Ambulation/Gait   Gait Comments antalgic gait; did not bring cane in, needed PT HH x1 assist after 5x STS to get from tx room to checkout counter. requested walker to get out to car. quick to fatigue      Standardized Balance Assessment   Standardized Balance Assessment Five Times Sit  to Stand    Five times sit to stand comments  58 sec with UEs on thighs and increased mid back pain                      Objective measurements completed on examination: See above  findings.               PT Education - 02/04/21 1302    Education Details Pt educated on POC and HEP    Person(s) Educated Patient    Methods Explanation;Demonstration;Handout    Comprehension Verbalized understanding;Returned demonstration            PT Short Term Goals - 02/04/21 1353      PT SHORT TERM GOAL #1   Title Pt will be independent with HEP    Time 2    Period Weeks    Status New    Target Date 02/18/21             PT Long Term Goals - 02/04/21 1353      PT LONG TERM GOAL #1   Title Pt will report ability to walk >30 min with no increase LBP    Time 8    Period Weeks    Status New    Target Date 04/01/21      PT LONG TERM GOAL #2   Title Pt will report 50% reduction in thoracic/LBP    Time 8    Period Weeks    Status New    Target Date 04/01/21      PT LONG TERM GOAL #3   Title Pt will demo 5x STS <20 sec without UEs    Time 8    Period Weeks    Status New    Target Date 04/01/21      PT LONG TERM GOAL #4   Title Pt will demo lumbar AROM <25% limited with no increase in thoracic/LBP    Time 8    Period Weeks    Status New    Target Date 04/01/21                  Plan - 02/04/21 1307    Clinical Impression Statement Pt presents to clinic with reports of weakness, deconditioning, and recurring mid thoracic/LBP. Pt was hospitalized 5/4-5/7 of this year for sudden onset weakness and difficulty walking. CT of brain/cervical neg for acute abnormalities, CT of abdomen suggestive of potential pancreatitis. Pt is reporting increased numbness in B hands and tip of tongue, CN screen WFL. Pt does have hx of chronic inflammatory demylinating polyneuropathy (CIDP) and B12 deficiency.Has f/u with PCP tomorrow, 5/18, encouraged pt to follow up with  MD regarding these issues with pt VU and agreement. Pt demos decreased/painful/guarded lumbar AROM, decreased LE strength on MMT, functional LE weakness, fall risk per 5x STS time, and endurance deficits. Pt would benefit from skilled PT to decrease pain, increase ROM, and improve functional strength/endurance in order to return to PLOF.    Personal Factors and Comorbidities Comorbidity 3+    Comorbidities CIDP, sickle cell anemia, seizure disorder, HTN, anxiety/depression and psoriatic arthritis    Examination-Activity Limitations Bend;Locomotion Level;Stand;Lift    Examination-Participation Restrictions Community Activity;Interpersonal Relationship    Stability/Clinical Decision Making Evolving/Moderate complexity    Clinical Decision Making Moderate    Rehab Potential Good    PT Frequency 2x / week   transitioning to 1x/week after 3-4 weeks   PT Duration 8 weeks    PT Treatment/Interventions ADLs/Self Care Home Management;Electrical Stimulation;Iontophoresis 4mg /ml Dexamethasone;Moist Heat;Neuromuscular re-education;Balance training;Therapeutic exercise;Therapeutic activities;Functional mobility training;Gait training;Patient/family education;Manual techniques;Dry needling;Passive range of motion    PT Next Visit Plan review/progress HEP, gentle progression to TE, functional LE strengthening, endurance ex's, gait training, lumbar/thoracic flexibility and stability ex's, manual/modalities as indicated  PT Home Exercise Plan see pt instructions    Consulted and Agree with Plan of Care Patient           Patient will benefit from skilled therapeutic intervention in order to improve the following deficits and impairments:  Abnormal gait,Decreased range of motion,Difficulty walking,Decreased endurance,Increased muscle spasms,Decreased activity tolerance,Pain,Decreased balance,Hypomobility,Impaired flexibility,Decreased mobility,Decreased strength  Visit Diagnosis: Decreased functional mobility  and endurance  Muscle weakness (generalized)  Acute bilateral low back pain without sciatica  Pain in thoracic spine     Problem List Patient Active Problem List   Diagnosis Date Noted  . Weakness 01/23/2021  . Generalized weakness 01/22/2021  . SOB (shortness of breath) 01/15/2021  . Precordial chest pain 01/15/2021  . Hypertension 04/20/2020  . Tachycardia 04/20/2020  . Psoriasis 04/20/2020  . Palpitations 12/05/2019  . Slurred speech 12/05/2019  . Weakness on left side of face 12/05/2019  . Costochondritis 12/05/2019  . Skin rash 09/02/2019  . Abdominal pain, chronic, right lower quadrant   . Seizure disorder ([place])   . Seizure ([place]) 07/11/2018  . Herpes 09/19/2017  . Diverticulosis 02/15/2017  . Internal hemorrhoids 02/15/2017  . Hematochezia 11/16/2016  . History of diabetes mellitus, type II 08/17/2016  . GERD (gastroesophageal reflux disease) 08/17/2016  . Anemia 08/17/2016  . Chronic pelvic pain in female 06/24/2016   Amador Cunas, PT, DPT Donald Prose Ulanda Tackett 02/04/2021, 2:01 PM  [place] [place], [place], [address] Phone: [phone]   Fax:  [phone]  Name: Stacy Jackson MRN: [id number] Date of Birth: 18-Feb-1981

## 2021-02-05 ENCOUNTER — Ambulatory Visit (INDEPENDENT_AMBULATORY_CARE_PROVIDER_SITE_OTHER): Payer: Medicaid Other | Admitting: Internal Medicine

## 2021-02-05 ENCOUNTER — Encounter: Payer: Self-pay | Admitting: Internal Medicine

## 2021-02-05 VITALS — BP 118/88 | HR 128 | Resp 20 | Ht 59.0 in | Wt 127.5 lb

## 2021-02-05 DIAGNOSIS — R748 Abnormal levels of other serum enzymes: Secondary | ICD-10-CM

## 2021-02-05 DIAGNOSIS — E876 Hypokalemia: Secondary | ICD-10-CM

## 2021-02-05 DIAGNOSIS — R29898 Other symptoms and signs involving the musculoskeletal system: Secondary | ICD-10-CM | POA: Diagnosis not present

## 2021-02-05 DIAGNOSIS — R2681 Unsteadiness on feet: Secondary | ICD-10-CM | POA: Diagnosis not present

## 2021-02-05 DIAGNOSIS — B009 Herpesviral infection, unspecified: Secondary | ICD-10-CM

## 2021-02-05 DIAGNOSIS — E871 Hypo-osmolality and hyponatremia: Secondary | ICD-10-CM

## 2021-02-05 DIAGNOSIS — D649 Anemia, unspecified: Secondary | ICD-10-CM | POA: Diagnosis not present

## 2021-02-05 DIAGNOSIS — R739 Hyperglycemia, unspecified: Secondary | ICD-10-CM

## 2021-02-05 DIAGNOSIS — R569 Unspecified convulsions: Secondary | ICD-10-CM

## 2021-02-05 DIAGNOSIS — L98499 Non-pressure chronic ulcer of skin of other sites with unspecified severity: Secondary | ICD-10-CM

## 2021-02-05 MED ORDER — ACYCLOVIR 400 MG PO TABS: ORAL_TABLET | ORAL | 0 refills | Status: DC

## 2021-02-05 MED ORDER — CYCLOBENZAPRINE HCL 10 MG PO TABS: ORAL_TABLET | ORAL | 4 refills | Status: AC

## 2021-02-05 NOTE — Progress Notes (Signed)
Subjective:    Patient ID: Stacy Jackson, female   DOB: Jan 31, 1981, 40 y.o.   MRN: [id number]   HPI   Her aunt, [place] ?Joaquim Lai is with her today. Follow up of hospitalization from 01/21/2021 to 01/24/2021  1.  Left leg weakness that progressed to inability to move anything from "the neck down":  Out shopping on day of admission when left leg started dragging and felt weak.  Just felt off.  Went home to lie down and tried to take a nap.  Couldn't sleep, but couldn't move at all.   Patient was undergoing Cardiac Event monitoring through Cardiology, Dr. Percival Spanish Heart monitor folks called while trying to sleep around 3-4 O'clock and said she should be calling 911--that she had "passed out".  Not clear she lost consiousness and daughter answered the phone.   States she has not heard from Dr. Rosezella Florida office since and was not seen by cardiology during hospitalization.   States her daughter had to keep calling to her to wake up to take the phone call.   CT of head on admission normal and MR with and without contrast of head and neck was normal LP performed 5/5 with normal protein and glucose, IgG and WBC as well. Bacterial and fungal cultures of CSF ultimately negative.  Work up did not support Academic librarian syndrome or recurrence of CIDP, the latter for which she was treated in 2005-2007.  Patient also with previous diagnosis of seizures vs non seizure episodes.  Please see my notes from 04/11/2017, 04/29/20, and 08/29/20  when she brought this up after new episodes while in [place].  Visit on 7/23/21was the first time she mentioned these and stated diagnosed with seizures in 2008. Episodes starting in 2018 were felt not to be seizure activity by ED personnel, but patient followed up by [facility], who continued her on [place]. Evaluated by Dr. Leta Baptist for a local neurologist here 05/2017.  Underwent continuous video monitoring EEG with provocating procedures while off meds  at [facility] without abnormality.  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   Recommended to taper off Keppra.  She continued to obtain from [employer]. Communicated this work up with her Careers adviser and concern for continuing Keppra, but patient did not follow up with him subsequently, stating her insurance no longer covered.   Refused to go back to Dr. Leta Baptist and sent to Dr. Matilde Bash at [facility] end of 2021. MR of brain with slightly age advanced parenchymal volume loss and EEG normal. MR done to rule out vasculitis with relative recent diagnosis of Psoriatic arthritis. Patient is unaware of what her follow up was to be with Dr. Joesph July.  Patient's perception of this work up was that she could be having "mini-strokes"  Symptoms regarding this today:   Still a bit shaky and legs just don't feel has strong.  She also states she was told to obtain a 4 pronged cane.    2.  Findings on CT of possible fluid around uncinate process of pancreas and elevated Lipase of 196 on admission.  Repeat Lipase the following day, 01/22/21:  190.  Patient states she was not having any more abdominal discomfort than her usual and was in usual right sided area..     3.  Palpitations with tachycardia and dyspnea:  This was a new complaint in 10/2019.  Labs and ECG did not show an etiology.  Unresponsive to beta blockade.  Sent to  Cardiology, Dr. Percival Spanish, for event monitor.  Echo, ECG and lab normal I did call Cardiology after patient's visit and asked about event monitor on 01/21/2021 when admitted and received call regarding abnormal rhythm on event monitor.  Apparently, the monitor read out possible V tach, but also apparently had lead loss with this episode.  Plan is for [facility] and quicker follow up  4.  Reported history of COVID, 04/2019:  This is again history I have not been able to confirm:  States she was tested at a [facility] site and was positive and has  not felt well since.  However, she had a negative test 6/10 /2020 at a [facility] site and 04/25/2019 at [place] was also negative.    5.  End of visit:  Brings up has a new vesicular lesion on left posterior shoulder area for which I have treated her with Acyclovir in past as have never been able to view a current lesion.  She is no longer using repressive Acyclovir dosing.  Current Meds  Medication Sig  . busPIRone (BUSPAR) 5 MG tablet Take 1 tablet (5 mg total) by mouth 3 (three) times daily.  . clobetasol ointment (TEMOVATE) 0.05 % Apply to affected areas twice daily as needed  . cyanocobalamin 100 MCG tablet Take 1,000 mcg by mouth daily.  Marland Kitchen dicyclomine (BENTYL) 10 MG capsule 1 cap by mouth every 8 hours as needed (Patient taking differently: Take 10 mg by mouth every 8 (eight) hours as needed for spasms.)  . EPINEPHrine (EPIPEN 2-PAK) 0.3 mg/0.3 mL IJ SOAJ injection Inject 0.3 mg into the muscle as needed for anaphylaxis.  . famotidine (PEPCID) 20 MG tablet 3 tablets by mouth 1 hour before breakfast (Patient taking differently: Take 60 mg by mouth daily before breakfast.)  . fexofenadine (ALLEGRA) 180 MG tablet 1 tab by mouth daily as needed (Patient taking differently: Take 180 mg by mouth as needed for allergies.)  . gabapentin (NEURONTIN) 400 MG capsule TAKE 1 CAPSULE BY MOUTH THREE TIMES DAILY (Patient taking differently: Take 400 mg by mouth 3 (three) times daily.)  . Iron Polysacch Cmplx-B12-FA 150-0.025-1 MG CAPS 1 cap by mouth daily (Patient taking differently: Take 1 capsule by mouth daily.)  . levETIRAcetam (KEPPRA) 1000 MG tablet Take 1 tablet by mouth twice daily (Patient taking differently: Take 1,000 mg by mouth 2 (two) times daily.)  . methotrexate (RHEUMATREX) 2.5 MG tablet Take 7.5 mg by mouth once a week. Sundays or Mondays  . metoprolol succinate (TOPROL-XL) 25 MG 24 hr tablet Take 2 tablets (50 mg total) by mouth daily. Take with or immediately following a meal.  .  naproxen sodium (ALEVE) 220 MG tablet Take 220 mg by mouth daily as needed (pain).  Marland Kitchen tiZANidine (ZANAFLEX) 4 MG tablet Take 2-4 mg by mouth at bedtime as needed for muscle spasms (alternates with cyclobenzaprine).  Marland Kitchen TREMFYA 100 MG/ML SOSY Inject 1 mL into the skin every 8 (eight) weeks.  Marland Kitchen venlafaxine XR (EFFEXOR XR) 75 MG 24 hr capsule Take 1 capsule (75 mg total) by mouth daily with breakfast.  . [DISCONTINUED] cyclobenzaprine (FLEXERIL) 10 MG tablet Take 10 mg by mouth at bedtime as needed for muscle spasms (alternates with tizanidine).   Allergies  Allergen Reactions  . Shrimp [Shellfish Allergy] Anaphylaxis     Review of Systems    Objective:   BP 118/88 (BP Location: Left Arm, Patient Position: Sitting, Cuff Size: Normal)   Pulse (!) 128   Resp 20  Ht 4\' 11"  (1.499 m)   Wt 127 lb 8 oz (57.8 kg)   LMP 12/14/2012   BMI 25.75 kg/m   Physical Exam  NAD HEENT:  PERRL, EOMI, TMs pearly gray, throat without injection. Neck:  Supple, No adenopathy.   Chest:  CTA CV:  RRR, a bit tachycardic.  No murmur or rub.  Radial and DP pulses normal and equal Abd:  S, mild right mid and lower abdominal tenderness.  Perhaps a bit epigastric tenderness.  No rebound or peritoneal signs. LE:  No edema Neuro:  A & O x 3, CN II-XII grossly intact.  Motor 5/5 DTRs 2+/4 throughout save for ankle reflexes--unable to elicit. Gait is slow and unsteady, but not wide based. Left posterior shoulder:  Nickel sized cluster of small vesicles on red base.  Overlying tattoo   Assessment & Plan  1.  Neurologic symptoms in a patient with concern for other functional symptoms/non epileptic episodes and inconsistent follow up:  She has shared mental trauma issues in past.  Have not been able to get her to follow through with counseling at our clinic or otherwise in past.   She has also had previous episodes of neurologic symptoms for which she has inexplicably not sought medical attention. Feel  Neuropsyhology to assess functional vs organic etiology with this and direct needed treatment. Her aunt is supportive of this. I did share with her the concern regarding her symptoms and evaluation in recent years not supporting organic diagnosis.  She does, however, have history of definitive organic health issues. I will also contact Dr. Joesph July, Neuro at [facility] to clarify her work up assessment and followup plans CBC, CMP repeat today.  2.  Possible abnormal rhythm/vtach on event monitor:  Again, spoke with Rip Harbour at Dr. Rosezella Florida office.  Technical issues with lead loss muddy this picture and patient did not have issues during hospitalization.  Lexiscan myoview and follow up planned.  3.  Question of pancreatitis:  Repeat Lipase today  4.  Hyperglycemia:  Noted in labs on admission.  Has been controlled since weight loss with gastric bypass.  A1C  5.  Vesicular lesion, left shoulder:  Swab taken from unroofed lesion for herpes culture and ID.  Acyclovir.

## 2021-02-06 ENCOUNTER — Telehealth: Payer: Self-pay | Admitting: *Deleted

## 2021-02-06 DIAGNOSIS — I472 Ventricular tachycardia: Secondary | ICD-10-CM

## 2021-02-06 DIAGNOSIS — I4729 Other ventricular tachycardia: Secondary | ICD-10-CM

## 2021-02-06 DIAGNOSIS — R002 Palpitations: Secondary | ICD-10-CM

## 2021-02-06 NOTE — Telephone Encounter (Signed)
Received a call from Dr Amil Amen regarding patient and recent hospitalization  Patient had an episode on 5/4 after shopping where she had leg weakness  Per Dr Amil Amen patient received call from [facility] regarding monitor results and was advised to go to ED Did find critical monitor results from 01/21/2021 8:21 pm CT   Monitor results read as sinus rhythm, Ventricular Tachycardia (8 sec) with lead loss  Reviewed by Dr Percival Spanish and he suggested Leane Call and then move up appointment afterwards Advised patient and message to scheduling to arrange [facility] with Dr Amil Amen and advised patient to have lexiscan myoview and she is aware

## 2021-02-09 ENCOUNTER — Encounter: Payer: Self-pay | Admitting: Psychology

## 2021-02-09 ENCOUNTER — Telehealth: Payer: Self-pay | Admitting: Internal Medicine

## 2021-02-09 NOTE — Telephone Encounter (Signed)
Butch Penny from [facility] called in regards of the referral you sent for the patient. She asked if you can please clarify if you need Interventional Pain Management or Neuropsychologist, if the referral was for the second option, a diagnosis needs to be send as well.   Dr. Amil Amen was available and talked to [facility]. [phone].

## 2021-02-10 LAB — COMPREHENSIVE METABOLIC PANEL
ALT: 33 IU/L — ABNORMAL HIGH (ref 0–32)
AST: 48 IU/L — ABNORMAL HIGH (ref 0–40)
Albumin/Globulin Ratio: 1.7 (ref 1.2–2.2)
Albumin: 4.5 g/dL (ref 3.8–4.8)
Alkaline Phosphatase: 82 IU/L (ref 44–121)
BUN/Creatinine Ratio: 20 (ref 9–23)
BUN: 12 mg/dL (ref 6–20)
Bilirubin Total: 0.4 mg/dL (ref 0.0–1.2)
CO2: 18 mmol/L — ABNORMAL LOW (ref 20–29)
Calcium: 9.1 mg/dL (ref 8.7–10.2)
Chloride: 97 mmol/L (ref 96–106)
Creatinine, Ser: 0.59 mg/dL (ref 0.57–1.00)
Globulin, Total: 2.7 g/dL (ref 1.5–4.5)
Glucose: 61 mg/dL — ABNORMAL LOW (ref 65–99)
Potassium: 5.7 mmol/L — ABNORMAL HIGH (ref 3.5–5.2)
Sodium: 140 mmol/L (ref 134–144)
Total Protein: 7.2 g/dL (ref 6.0–8.5)
eGFR: 117 mL/min/{1.73_m2} (ref >59)

## 2021-02-10 LAB — CBC WITH DIFFERENTIAL/PLATELET
Basophils Absolute: 0.1 10*3/uL (ref 0.0–0.2)
Basos: 1 %
EOS (ABSOLUTE): 0 10*3/uL (ref 0.0–0.4)
Eos: 0 %
Hematocrit: 32.5 % — ABNORMAL LOW (ref 34.0–46.6)
Hemoglobin: 10.3 g/dL — ABNORMAL LOW (ref 11.1–15.9)
Immature Grans (Abs): 0 10*3/uL (ref 0.0–0.1)
Immature Granulocytes: 0 %
Lymphocytes Absolute: 1.6 10*3/uL (ref 0.7–3.1)
Lymphs: 31 %
MCH: 24.1 pg — ABNORMAL LOW (ref 26.6–33.0)
MCHC: 31.7 g/dL (ref 31.5–35.7)
MCV: 76 fL — ABNORMAL LOW (ref 79–97)
Monocytes Absolute: 0.4 10*3/uL (ref 0.1–0.9)
Monocytes: 7 %
Neutrophils Absolute: 3.2 10*3/uL (ref 1.4–7.0)
Neutrophils: 61 %
Platelets: 443 10*3/uL (ref 150–450)
RBC: 4.27 x10E6/uL (ref 3.77–5.28)
RDW: 19.9 % — ABNORMAL HIGH (ref 11.7–15.4)
WBC: 5.3 10*3/uL (ref 3.4–10.8)

## 2021-02-10 LAB — HERPES CULTURE, RAPID

## 2021-02-10 LAB — HGB A1C W/O EAG: Hgb A1c MFr Bld: 5.2 % (ref 4.8–5.6)

## 2021-02-10 LAB — LIPASE: Lipase: 74 U/L — ABNORMAL HIGH (ref 14–72)

## 2021-02-12 ENCOUNTER — Encounter: Payer: Self-pay | Admitting: Internal Medicine

## 2021-02-12 DIAGNOSIS — R29898 Other symptoms and signs involving the musculoskeletal system: Secondary | ICD-10-CM | POA: Insufficient documentation

## 2021-02-12 LAB — CULTURE, FUNGUS WITHOUT SMEAR

## 2021-02-17 ENCOUNTER — Telehealth (HOSPITAL_COMMUNITY): Payer: Self-pay | Admitting: *Deleted

## 2021-02-17 DIAGNOSIS — E041 Nontoxic single thyroid nodule: Secondary | ICD-10-CM | POA: Insufficient documentation

## 2021-02-17 DIAGNOSIS — F419 Anxiety disorder, unspecified: Secondary | ICD-10-CM | POA: Insufficient documentation

## 2021-02-17 DIAGNOSIS — E7800 Pure hypercholesterolemia, unspecified: Secondary | ICD-10-CM | POA: Insufficient documentation

## 2021-02-17 DIAGNOSIS — F32A Depression, unspecified: Secondary | ICD-10-CM | POA: Insufficient documentation

## 2021-02-17 MED ORDER — ACYCLOVIR 400 MG PO TABS: ORAL_TABLET | ORAL | 11 refills | Status: DC

## 2021-02-17 NOTE — Telephone Encounter (Signed)
Close encounter 

## 2021-02-17 NOTE — Addendum Note (Signed)
Addended by: Marcelino Duster on: 02/17/2021 03:37 PM   Modules accepted: Orders

## 2021-02-18 ENCOUNTER — Ambulatory Visit (HOSPITAL_COMMUNITY)
Admission: RE | Admit: 2021-02-18 | Discharge: 2021-02-18 | Disposition: A | Discharge status: Home / Self Care | Payer: Medicaid Other | Source: Ambulatory Visit | Attending: Cardiovascular Disease | Admitting: Cardiovascular Disease

## 2021-02-18 ENCOUNTER — Other Ambulatory Visit: Payer: Self-pay

## 2021-02-18 DIAGNOSIS — R002 Palpitations: Secondary | ICD-10-CM | POA: Insufficient documentation

## 2021-02-18 LAB — MYOCARDIAL PERFUSION IMAGING
LV dias vol: 91 mL (ref 46–106)
LV sys vol: 39 mL
Peak HR: 130 {beats}/min
Rest HR: 97 {beats}/min
SDS: 2
SRS: 0
SSS: 2
TID: 0.98

## 2021-02-18 MED ORDER — TECHNETIUM TC 99M TETROFOSMIN IV KIT
9.8000 | PACK | Freq: Once | INTRAVENOUS | Status: AC | PRN
  Administered 2021-02-18: 9.8 via INTRAVENOUS
  Filled 2021-02-18: qty 10

## 2021-02-18 MED ORDER — ADENOSINE (DIAGNOSTIC) 3 MG/ML IV SOLN
0.5600 mg/kg | Freq: Once | INTRAVENOUS | Status: AC
  Administered 2021-02-18: 32.4 mg via INTRAVENOUS

## 2021-02-18 MED ORDER — TECHNETIUM TC 99M TETROFOSMIN IV KIT
30.7000 | PACK | Freq: Once | INTRAVENOUS | Status: AC | PRN
  Administered 2021-02-18: 30.7 via INTRAVENOUS
  Filled 2021-02-18: qty 31

## 2021-02-23 ENCOUNTER — Ambulatory Visit: Payer: Medicaid Other | Attending: Internal Medicine | Admitting: Physical Therapy

## 2021-02-23 ENCOUNTER — Telehealth: Payer: Self-pay | Admitting: Internal Medicine

## 2021-02-23 DIAGNOSIS — M6281 Muscle weakness (generalized): Secondary | ICD-10-CM | POA: Insufficient documentation

## 2021-02-23 DIAGNOSIS — M545 Low back pain, unspecified: Secondary | ICD-10-CM | POA: Insufficient documentation

## 2021-02-23 DIAGNOSIS — Z7409 Other reduced mobility: Secondary | ICD-10-CM | POA: Insufficient documentation

## 2021-02-23 NOTE — Telephone Encounter (Signed)
Patient called to request a refill of metoprolol succinate (TOPROL-XL) 25 MG 24 hr tablet. Patient uses pharmacy at [facility].

## 2021-02-24 ENCOUNTER — Encounter: Payer: Self-pay | Admitting: Diagnostic Neuroimaging

## 2021-02-24 ENCOUNTER — Telehealth: Payer: Self-pay | Admitting: *Deleted

## 2021-02-24 ENCOUNTER — Institutional Professional Consult (permissible substitution): Payer: Self-pay | Admitting: Diagnostic Neuroimaging

## 2021-02-24 NOTE — Telephone Encounter (Signed)
Patient was no show for new patient appointment today. 

## 2021-02-25 MED ORDER — METOPROLOL SUCCINATE ER 25 MG PO TB24: 50.0000 mg | ORAL_TABLET | Freq: Every day | ORAL | 11 refills | Status: DC

## 2021-02-26 ENCOUNTER — Ambulatory Visit: Payer: Medicaid Other | Admitting: Physical Therapy

## 2021-02-27 ENCOUNTER — Other Ambulatory Visit: Payer: Self-pay

## 2021-02-27 ENCOUNTER — Encounter: Payer: Self-pay | Admitting: Internal Medicine

## 2021-02-27 ENCOUNTER — Ambulatory Visit (INDEPENDENT_AMBULATORY_CARE_PROVIDER_SITE_OTHER): Payer: Medicaid Other | Admitting: Internal Medicine

## 2021-02-27 VITALS — BP 120/90 | HR 100 | Resp 12 | Ht 59.0 in | Wt 131.0 lb

## 2021-02-27 DIAGNOSIS — F419 Anxiety disorder, unspecified: Secondary | ICD-10-CM

## 2021-02-27 DIAGNOSIS — F1010 Alcohol abuse, uncomplicated: Secondary | ICD-10-CM

## 2021-02-27 DIAGNOSIS — R748 Abnormal levels of other serum enzymes: Secondary | ICD-10-CM | POA: Diagnosis not present

## 2021-02-27 DIAGNOSIS — F32A Depression, unspecified: Secondary | ICD-10-CM

## 2021-02-27 DIAGNOSIS — R7989 Other specified abnormal findings of blood chemistry: Secondary | ICD-10-CM

## 2021-02-27 DIAGNOSIS — E875 Hyperkalemia: Secondary | ICD-10-CM

## 2021-02-27 DIAGNOSIS — R5383 Other fatigue: Secondary | ICD-10-CM

## 2021-02-27 DIAGNOSIS — R002 Palpitations: Secondary | ICD-10-CM
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
## 2021-02-28 LAB — COMPREHENSIVE METABOLIC PANEL
ALT: 17 IU/L (ref 0–32)
AST: 22 IU/L (ref 0–40)
Albumin/Globulin Ratio: 2 (ref 1.2–2.2)
Albumin: 4.3 g/dL (ref 3.8–4.8)
Alkaline Phosphatase: 64 IU/L (ref 44–121)
BUN/Creatinine Ratio: 19 (ref 9–23)
BUN: 12 mg/dL (ref 6–20)
Bilirubin Total: 0.3 mg/dL (ref 0.0–1.2)
CO2: 24 mmol/L (ref 20–29)
Calcium: 8.4 mg/dL — ABNORMAL LOW (ref 8.7–10.2)
Chloride: 104 mmol/L (ref 96–106)
Creatinine, Ser: 0.63 mg/dL (ref 0.57–1.00)
Globulin, Total: 2.1 g/dL (ref 1.5–4.5)
Glucose: 86 mg/dL (ref 65–99)
Potassium: 3.8 mmol/L (ref 3.5–5.2)
Sodium: 145 mmol/L — ABNORMAL HIGH (ref 134–144)
Total Protein: 6.4 g/dL (ref 6.0–8.5)
eGFR: 116 mL/min/{1.73_m2} (ref >59)

## 2021-02-28 LAB — TSH: TSH: 0.612 u[IU]/mL (ref 0.450–4.500)

## 2021-02-28 LAB — LIPASE: Lipase: 75 U/L — ABNORMAL HIGH (ref 14–72)

## 2021-02-28 LAB — T4, FREE: Free T4: 0.75 ng/dL — ABNORMAL LOW (ref 0.82–1.77)

## 2021-03-01 ENCOUNTER — Encounter: Payer: Self-pay | Admitting: Internal Medicine

## 2021-03-01 NOTE — Progress Notes (Deleted)
Cardiology Office Note   Date:  03/01/2021   ID:  Stacy Jackson, DOB Jan 14, 1981, MRN [id number]  PCP:  Mack Hook, MD  Cardiologist:   None Referring:  Mack Hook, MD  No chief complaint on file.     History of Present Illness: Stacy Jackson is a 40 y.o. female who was referred by Mack Hook, MD fo evaluation of palpitations.   An echo was unremarkable.  ***  The patient has not had any prior cardiac history.  She states she has not felt well since August 2020 when she had COVID.  She states she was short of breath and very weak with that.  Since then she feels like she cannot take a deep breath.  She gets short of breath with activities.  Her chest hurts when she is trying to do stuff.  It is a sharp discomfort.  She is not describing cough fevers or chills.  She sleeps on 3 pillows but is not describing classic PND or orthopnea.  She does not have any leg swelling.  She feels her heart racing.  This happens when she is having chest discomfort more spontaneously.  Her water alert her that her rate might be greater than 120.  It lasts for 20 to 60 seconds.  It happens at rest.  It comes and goes spontaneously.  She was started on metoprolol but she does not know and thinks that has helped.  She is not had any syncope.  She has never had this kind of discomfort before.  She does not have any prior cardiac testing.  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   Seizures ([place])    Sickle cell anemia ([place])    Stroke [facility])     Past Surgical History:  Procedure Laterality Date   CESAREAN SECTION  2003, 2008   2nd with left oophorectomy   COLONOSCOPY WITH PROPOFOL N/A 11/22/2016   Procedure: COLONOSCOPY WITH PROPOFOL;  Surgeon: Arta Silence, MD;  Location: [facility];  Service: Endoscopy;  Laterality: N/A;   ESOPHAGOGASTRODUODENOSCOPY  01/13/2015   Normal biopsy of gastric mucosa/gastric pouch   NISSEN FUNDOPLICATION  3382   OOPHORECTOMY Left 2008   Done at same time of Csection   ROUX-EN-Y GASTRIC BYPASS  ?with Nissen fundoplication   Unknown year--found in GI records from [facility] as bariatric procedure.   VAGINAL HYSTERECTOMY  2015   For cervical cancer unresponsive to LEEP     Current Outpatient Medications  Medication Sig Dispense Refill   acyclovir (ZOVIRAX) 400 MG tablet 1 tab by mouth twice daily 60 tablet 11   busPIRone (BUSPAR) 5 MG tablet Take 1 tablet (5 mg total) by mouth 3 (three) times daily. 90 tablet 4   clobetasol ointment (TEMOVATE) 0.05 % Apply to affected areas twice daily as needed 60 g 2  cyanocobalamin 100 MCG tablet Take 1,000 mcg by mouth daily.     cyclobenzaprine (FLEXERIL) 10 MG tablet 1/2 to 1 tab by mouth at bedtime as needed 30 tablet 4   dicyclomine (BENTYL) 10 MG capsule 1 cap by mouth every 8 hours as needed (Patient taking differently: Take 10 mg by mouth every 8 (eight) hours as needed for spasms.) 60 capsule 2   EPINEPHrine (EPIPEN 2-PAK) 0.3 mg/0.3 mL IJ SOAJ injection Inject 0.3 mg into the muscle as needed for anaphylaxis. 1 each 1   famotidine (PEPCID) 20 MG tablet 3 tablets by mouth 1 hour before breakfast (Patient taking differently: Take 60 mg by mouth daily before breakfast.) 90 tablet 11   fexofenadine (ALLEGRA) 180 MG tablet 1 tab by mouth daily as needed  (Patient taking differently: Take 180 mg by mouth as needed for allergies.) 30 tablet 11   gabapentin (NEURONTIN) 400 MG capsule TAKE 1 CAPSULE BY MOUTH THREE TIMES DAILY (Patient taking differently: Take 400 mg by mouth 3 (three) times daily.) 90 capsule 11   Iron Polysacch Cmplx-B12-FA 150-0.025-1 MG CAPS 1 cap by mouth daily (Patient taking differently: Take 1 capsule by mouth daily.) 30 capsule 11   levETIRAcetam (KEPPRA) 1000 MG tablet Take 1 tablet by mouth twice daily (Patient taking differently: Take 1,000 mg by mouth 2 (two) times daily.) 60 tablet 10   methotrexate (RHEUMATREX) 2.5 MG tablet Take 7.5 mg by mouth once a week. Sundays or Mondays     metoprolol succinate (TOPROL-XL) 25 MG 24 hr tablet Take 2 tablets (50 mg total) by mouth daily. Take with or immediately following a meal. 30 tablet 11   naproxen sodium (ALEVE) 220 MG tablet Take 220 mg by mouth daily as needed (pain).     TREMFYA 100 MG/ML SOSY Inject 1 mL into the skin every 8 (eight) weeks.     venlafaxine XR (EFFEXOR XR) 75 MG 24 hr capsule Take 1 capsule (75 mg total) by mouth daily with breakfast. 60 capsule 2   No current facility-administered medications for this visit.    Allergies:   Shrimp [shellfish allergy]    ROS:  Please see the history of present illness.   Otherwise, review of systems are positive for ***.   All other systems are reviewed and negative.    PHYSICAL EXAM: VS:  LMP 12/14/2012  , BMI There is no height or weight on file to calculate BMI. GENERAL:  Well appearing NECK:  No jugular venous distention, waveform within normal limits, carotid upstroke brisk and symmetric, no bruits, no thyromegaly LUNGS:  Clear to auscultation bilaterally CHEST:  Unremarkable HEART:  PMI not displaced or sustained,S1 and S2 within normal limits, no S3, no S4, no clicks, no rubs, *** murmurs ABD:  Flat, positive bowel sounds normal in frequency in pitch, no bruits, no rebound, no guarding, no midline pulsatile  mass, no hepatomegaly, no splenomegaly EXT:  2 plus pulses throughout, no edema, no cyanosis no clubbing     Well appearing HEENT:  Pupils equal round and reactive, fundi not visualized, oral mucosa unremarkable NECK:  No jugular venous distention, waveform within normal limits, carotid upstroke brisk and symmetric, no bruits, no thyromegaly LYMPHATICS:  No cervical, inguinal adenopathy LUNGS:  Clear to auscultation bilaterally BACK:  No CVA tenderness CHEST:  Unremarkable HEART:  PMI not displaced or sustained,S1 and S2 within normal limits, no S3, no S4, no clicks, no rubs, no murmurs ABD:  Flat, positive bowel sounds normal in frequency in pitch,  no bruits, no rebound, no guarding, no midline pulsatile mass, no hepatomegaly, no splenomegaly EXT:  2 plus pulses throughout, no edema, no cyanosis no clubbing SKIN:  No rashes no nodules NEURO:  Cranial nerves II through XII grossly intact, motor grossly intact throughout PSYCH:  Cognitively intact, oriented to person place and time    EKG:  EKG is *** ordered today. The ekg ordered today demonstrates sinus rhythm, rate ***, axis within normal limits, intervals within normal limits, no acute ST-T wave changes.  Poor anterior R wave progression possibly lead placement but could not exclude old anteroseptal MI.   Recent Labs: 12/16/2020: BNP 22.2 01/22/2021: Magnesium 2.1 02/05/2021: Hemoglobin 10.3; Platelets 443 02/27/2021: ALT 17; BUN 12; Creatinine, Ser 0.63; Potassium 3.8; Sodium 145; TSH 0.612    Lipid Panel    Component Value Date/Time   CHOL 245 (H) 10/29/2020 1118   TRIG 56 10/29/2020 1118   HDL 113 10/29/2020 1118   LDLCALC 123 (H) 10/29/2020 1118      Wt Readings from Last 3 Encounters:  02/27/21 131 lb (59.4 kg)  02/18/21 127 lb (57.6 kg)  02/05/21 127 lb 8 oz (57.8 kg)      Other studies Reviewed: Additional studies/ records that were reviewed today include: Labs. Review of the above records demonstrates:   Please see elsewhere in the note.     ASSESSMENT AND PLAN:  PALPITATIONS :  ***   The patient has tachypalpitations.  She will need a 2-week event monitor.  I do see that she has normal electrolytes and thyroid.  SOB:    BNP was normal .  ***  I will check a BNP level.  She will also need an echocardiogram.  CHEST PAIN:  Echo was unremarkable.  ***   I do not strongly suspect pericarditis myocarditis or obstructive coronary disease.  I will start with the testing as above and have a low threshold however for further testing such as a POET (Plain Old Exercise Treadmill)   Current medicines are reviewed at length with the patient today.  The patient does not have concerns regarding medicines.  The following changes have been made:  ***  Labs/ tests ordered today include:  ***  No orders of the defined types were placed in this encounter.    Disposition:   FU with me in ***   Signed, Minus Breeding, MD  03/01/2021 4:09 PM    [facility]

## 2021-03-02 ENCOUNTER — Telehealth: Payer: Self-pay

## 2021-03-02 ENCOUNTER — Ambulatory Visit: Payer: Medicaid Other | Admitting: Cardiology

## 2021-03-02 NOTE — Telephone Encounter (Signed)
Called patient, she missed her appointment at 12:00 today with Dr.Hochrein. She states that she did not know she had an appointment, and would miss the one for today.  I did message schedulers and advise them to call and get her rescheduled.  Patient verbalized understanding, thankful for calling to check on her.

## 2021-03-03 ENCOUNTER — Ambulatory Visit: Payer: Medicaid Other | Admitting: Physical Therapy

## 2021-03-03 ENCOUNTER — Other Ambulatory Visit: Payer: Self-pay

## 2021-03-03 DIAGNOSIS — M545 Low back pain, unspecified: Secondary | ICD-10-CM | POA: Diagnosis present

## 2021-03-03 DIAGNOSIS — Z7409 Other reduced mobility: Secondary | ICD-10-CM | POA: Diagnosis not present

## 2021-03-03 DIAGNOSIS — M6281 Muscle weakness (generalized): Secondary | ICD-10-CM | POA: Diagnosis present

## 2021-03-03 NOTE — Therapy (Signed)
[address] [place], [place], [address] Phone: [phone]   Fax:  [phone]  Physical Therapy Treatment  Patient Details  Name: Stacy Jackson MRN: [id number] Date of Birth: [birth year] Referring Provider (PT): Starla Link   Encounter Date: 03/03/2021   PT End of Session - 03/03/21 1135     Visit Number 2    Date for PT Re-Evaluation 04/29/21    Authorization Type 27 total visits per year    PT Start Time 1100    PT Stop Time 1140    PT Time Calculation (min) 40 min             Past Medical History:  Diagnosis Date   Abdominal pain, chronic, right lower quadrant    Cervical cancer ([place]) 2015   Unsuccessful LEEP, then TAH   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   Seizures [facility]     Past Surgical History:  Procedure Laterality Date   CESAREAN SECTION  2003, 2008   2nd with left oophorectomy   COLONOSCOPY WITH PROPOFOL N/A 11/22/2016   Procedure: COLONOSCOPY WITH PROPOFOL;  Surgeon: Arta Silence, MD;  [facility];  Service: Endoscopy;  Laterality: N/A;   ESOPHAGOGASTRODUODENOSCOPY  01/13/2015   Normal biopsy of gastric mucosa/gastric pouch   NISSEN FUNDOPLICATION  4132   OOPHORECTOMY Left 2008   Done at same time of Csection   ROUX-EN-Y GASTRIC BYPASS  ?with Nissen fundoplication   Unknown year--found in GI records [facility] as bariatric procedure.   VAGINAL HYSTERECTOMY  2015   For cervical cancer  unresponsive to LEEP    There were no vitals filed for this visit.   Subjective Assessment - 03/03/21 1104     Subjective "its been rough last couple days increased back and stomach spasms- I haven't slept or ate anything" pt verb doing some ex at home from HEP    Currently in Pain? Yes    Pain Score 10-Worst pain ever                               [facility] Adult PT Treatment/Exercise - 03/03/21 0001       Exercises   Exercises Neck;Shoulder;Lumbar;Knee/Hip      Neck Exercises: Machines for Strengthening   UBE (Upper Arm Bike) L 1 1.5 min fwd/1.5 min back      Lumbar Exercises: Aerobic   Nustep L 3 5 min      Lumbar Exercises: Machines for Strengthening   Cybex Lumbar Extension black tband 2 sets 10   trunk flex 2 sets 10   Other Lumbar Machine Exercise row and lats 2 sets 10 15#      Lumbar Exercises: Supine   Ab Set 10 reps;3 seconds   with ball   Clam 15 reps    Clam Limitations green tband    Bent Knee Raise 15 reps    Bent Knee Raise Limitations green tband  Bridge Non-compliant;10 reps;3 seconds   feet on ball plus KTC and obl   Straight Leg Raise 10 reps   left noteably harder than RT   Other Supine Lumbar Exercises ball btwn knee squeeze 15x                      PT Short Term Goals - 03/03/21 1120       PT SHORT TERM GOAL #1   Title Pt will be independent with HEP    Status Achieved               PT Long Term Goals - 02/04/21 1353       PT LONG TERM GOAL #1   Title Pt will report ability to walk >30 min with no increase LBP    Time 8    Period Weeks    Status New    Target Date 04/01/21      PT LONG TERM GOAL #2   Title Pt will report 50% reduction in thoracic/LBP    Time 8    Period Weeks    Status New    Target Date 04/01/21      PT LONG TERM GOAL #3   Title Pt will demo 5x STS <20 sec without UEs    Time 8    Period Weeks    Status New    Target Date 04/01/21      PT LONG TERM GOAL #4   Title Pt  will demo lumbar AROM <25% limited with no increase in thoracic/LBP    Time 8    Period Weeks    Status New    Target Date 04/01/21                   Plan - 03/03/21 1135     Clinical Impression Statement STG met. Pt tolerated initial progression of ther ex well. Cuing needed. Weakness and fatigue noted. Pain went from a 12/10 to a 9/10 with ex. Pt has not been here in 3 plus weeks form eval with multi change in appts but no reason given.    PT Treatment/Interventions ADLs/Self Care Home Management;Electrical Stimulation;Iontophoresis 72m/ml Dexamethasone;Moist Heat;Neuromuscular re-education;Balance training;Therapeutic exercise;Therapeutic activities;Functional mobility training;Gait training;Patient/family education;Manual techniques;Dry needling;Passive range of motion    PT Next Visit Plan review/progress HEP, gentle progression to TE, functional LE strengthening, endurance ex's, gait training, lumbar/thoracic flexibility and stability ex's, manual/modalities as indicated             Patient will benefit from skilled therapeutic intervention in order to improve the following deficits and impairments:  Abnormal gait, Decreased range of motion, Difficulty walking, Decreased endurance, Increased muscle spasms, Decreased activity tolerance, Pain, Decreased balance, Hypomobility, Impaired flexibility, Decreased mobility, Decreased strength  Visit Diagnosis: Decreased functional mobility and endurance  Muscle weakness (generalized)  Acute bilateral low back pain without sciatica     Problem List Patient Active Problem List   Diagnosis Date Noted   Anxiety and depression 02/17/2021   Nodular thyroid disease 02/17/2021   Hypercholesterolemia 02/17/2021   Weakness of both lower extremities 02/12/2021   Other fatigue 01/23/2021   Generalized weakness 01/22/2021   SOB (shortness of breath) 01/15/2021   Precordial chest pain 01/15/2021   Hypertension 04/20/2020    Tachycardia 04/20/2020   Psoriasis 04/20/2020   Palpitations 12/05/2019   Slurred speech 12/05/2019   Weakness on left side of face 12/05/2019   Costochondritis 12/05/2019   Skin rash 09/02/2019   Abdominal pain,  chronic, right lower quadrant    Seizure disorder [facility])    Seizure ([place]) 07/11/2018   Herpes 09/19/2017   Diverticulosis 02/15/2017   Internal hemorrhoids 02/15/2017   Hematochezia 11/16/2016   History of diabetes mellitus, type II 08/17/2016   GERD (gastroesophageal reflux disease) 08/17/2016   Anemia 08/17/2016   Chronic pelvic pain in female 06/24/2016    PAYSEUR,ANGIE PTA  03/03/2021, 11:39 AM  [address] [place], [place], [address] Phone: [phone]   Fax:  [phone]  Name: Mya Suell MRN: [id number] Date of Birth: 1981/05/21

## 2021-03-04 ENCOUNTER — Telehealth: Payer: Self-pay | Admitting: Clinical

## 2021-03-04 NOTE — Telephone Encounter (Signed)
LCSW contacted patient as a referral from PCP for counseling. LCSW asked if this is something of interest. Patient would like to be scheduled virtual due to distance and gas price. Scheduled appointment for 03/11/21 2pm virtual. LCSW informed of cancellation and no show policy applies and will be receiving a call from office staff or self on Tuesday as LCSW will be out Monday.

## 2021-03-05 ENCOUNTER — Ambulatory Visit: Payer: Medicaid Other | Admitting: Physical Therapy

## 2021-03-06 ENCOUNTER — Telehealth: Payer: Self-pay

## 2021-03-06 NOTE — Telephone Encounter (Signed)
Medical supply provider is asking for a response to their fax regarding Pt. order of a new glucometer

## 2021-03-09 NOTE — Telephone Encounter (Signed)
Notified the Pt that they do not meet criteria for a glucometer. Pt expressed that she has diabetes and wants to understand why she does not meet criteria. Also expressed that the insurance company is requiring her to have one. Pt also stated that the fax sent to the doctor includes an order for a blood pressure device which she also needs.

## 2021-03-10 ENCOUNTER — Other Ambulatory Visit: Payer: Self-pay

## 2021-03-10 ENCOUNTER — Ambulatory Visit: Payer: Medicaid Other | Admitting: Physical Therapy

## 2021-03-10 DIAGNOSIS — Z7409 Other reduced mobility: Secondary | ICD-10-CM

## 2021-03-10 DIAGNOSIS — M545 Low back pain, unspecified: Secondary | ICD-10-CM

## 2021-03-10 DIAGNOSIS — M6281 Muscle weakness (generalized): Secondary | ICD-10-CM

## 2021-03-10 NOTE — Therapy (Signed)
[place]. [place], [place], [address] Phone: [phone]   Fax:  [phone]  Physical Therapy Treatment  Patient Details  Name: Stacy Jackson MRN: [id number] Date of Birth: 03-10-1981 Referring Provider (PT): Starla Link   Encounter Date: 03/10/2021   PT End of Session - 03/10/21 1139     Visit Number 3    Date for PT Re-Evaluation 04/29/21    Authorization Type 27 total visits per year    PT Start Time 1100    PT Stop Time 1141    PT Time Calculation (min) 41 min             Past Medical History:  Diagnosis Date   Abdominal pain, chronic, right lower quadrant    Cervical cancer ([place]) 2015   Unsuccessful LEEP, then TAH   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   Seizures [facility])     Past Surgical History:  Procedure Laterality Date   CESAREAN SECTION  2003, 2008   2nd with left oophorectomy   COLONOSCOPY WITH PROPOFOL N/A 11/22/2016   Procedure: COLONOSCOPY WITH PROPOFOL;  Surgeon: Arta Silence, MD;  Location: [facility];  Service: Endoscopy;  Laterality: N/A;   ESOPHAGOGASTRODUODENOSCOPY  01/13/2015   Normal biopsy of gastric mucosa/gastric pouch   NISSEN FUNDOPLICATION  1194   OOPHORECTOMY Left 2008   Done at same time of Csection   ROUX-EN-Y GASTRIC BYPASS  ?with Nissen fundoplication   Unknown year--found in GI records from [facility] as bariatric procedure.   VAGINAL HYSTERECTOMY  2015   For cervical cancer  unresponsive to LEEP    There were no vitals filed for this visit.   Subjective Assessment - 03/10/21 1103     Subjective doing okay. sore in back and legs    Currently in Pain? Yes    Pain Score 6                                [facility] Adult PT Treatment/Exercise - 03/10/21 0001       Neck Exercises: Machines for Strengthening   UBE (Upper Arm Bike) L 2 2  min fwd/ 42min back      Lumbar Exercises: Aerobic   Recumbent Bike L 4 6 min      Lumbar Exercises: Machines for Strengthening   Cybex Lumbar Extension black tband 2 sets 10   plus trunk flex   Other Lumbar Machine Exercise row and lats 2 sets 10 20#      Knee/Hip Exercises: Stretches   Active Hamstring Stretch Both;2 reps;10 seconds    Hip Flexor Stretch Both;2 reps;10 seconds    Piriformis Stretch Both;2 reps;10 seconds    Gastroc Stretch Both;2 reps;10 seconds      Knee/Hip Exercises: Machines for Strengthening   Cybex Knee Extension 5# 10 x    Cybex Knee Flexion 15# 10 x      Knee/Hip Exercises: Standing  Lateral Step Up Both;10 reps;Hand Hold: 2;Step Height: 6"   opp leg abd   Forward Step Up Both;10 reps;Hand Hold: 2;Step Height: 6"   oppleg ext                     PT Short Term Goals - 03/03/21 1120       PT SHORT TERM GOAL #1   Title Pt will be independent with HEP    Status Achieved               PT Long Term Goals - 02/04/21 1353       PT LONG TERM GOAL #1   Title Pt will report ability to walk >30 min with no increase LBP    Time 8    Period Weeks    Status New    Target Date 04/01/21      PT LONG TERM GOAL #2   Title Pt will report 50% reduction in thoracic/LBP    Time 8    Period Weeks    Status New    Target Date 04/01/21      PT LONG TERM GOAL #3   Title Pt will demo 5x STS <20 sec without UEs    Time 8    Period Weeks    Status New    Target Date 04/01/21      PT LONG TERM GOAL #4   Title Pt will demo lumbar AROM <25% limited with no  increase in thoracic/LBP    Time 8    Period Weeks    Status New    Target Date 04/01/21                   Plan - 03/10/21 1140     Clinical Impression Statement progressed strengthening and endurance with cuing needed for tech and corrct muscle activation- visable LE fatigue with ex as noted by shaking. Inceased pain after ther ex with some relief at end of session active stretching    PT Treatment/Interventions ADLs/Self Care Home Management;Electrical Stimulation;Iontophoresis 4mg /ml Dexamethasone;Moist Heat;Neuromuscular re-education;Balance training;Therapeutic exercise;Therapeutic activities;Functional mobility training;Gait training;Patient/family education;Manual techniques;Dry needling;Passive range of motion    PT Next Visit Plan assess goals and increase HEP             Patient will benefit from skilled therapeutic intervention in order to improve the following deficits and impairments:  Abnormal gait, Decreased range of motion, Difficulty walking, Decreased endurance, Increased muscle spasms, Decreased activity tolerance, Pain, Decreased balance, Hypomobility, Impaired flexibility, Decreased mobility, Decreased strength  Visit Diagnosis: Decreased functional mobility and endurance  Muscle weakness (generalized)  Acute bilateral low back pain without sciatica     Problem List Patient Active Problem List   Diagnosis Date Noted   Anxiety and depression 02/17/2021   Nodular thyroid disease 02/17/2021   Hypercholesterolemia 02/17/2021   Weakness of both lower extremities 02/12/2021   Other fatigue 01/23/2021   Generalized weakness 01/22/2021   SOB (shortness of breath) 01/15/2021   Precordial chest pain 01/15/2021   Hypertension 04/20/2020   Tachycardia 04/20/2020   Psoriasis 04/20/2020   Palpitations 12/05/2019   Slurred speech 12/05/2019   Weakness on left side of face 12/05/2019   Costochondritis 12/05/2019   Skin rash 09/02/2019   Abdominal pain,  chronic, right lower quadrant    Seizure disorder [facility])    Seizure ([place]) 07/11/2018   Herpes 09/19/2017   Diverticulosis 02/15/2017   Internal hemorrhoids 02/15/2017   Hematochezia 11/16/2016   History  of diabetes mellitus, type II 08/17/2016   GERD (gastroesophageal reflux disease) 08/17/2016   Anemia 08/17/2016   Chronic pelvic pain in female 06/24/2016    Bettyanne Dittman,ANGIE PTA 03/10/2021, 11:42 AM  [place]. [place], [place], [address] Phone: [phone]   Fax:  [phone]  Name: Stacy Jackson MRN: [id number] Date of Birth: 1981-01-05

## 2021-03-11 ENCOUNTER — Telehealth: Payer: Medicaid Other | Admitting: Clinical

## 2021-03-11 ENCOUNTER — Encounter: Payer: Self-pay | Admitting: Internal Medicine

## 2021-03-11 DIAGNOSIS — I4729 Other ventricular tachycardia: Secondary | ICD-10-CM | POA: Insufficient documentation

## 2021-03-11 HISTORY — DX: Other ventricular tachycardia: I47.29

## 2021-03-11 NOTE — Progress Notes (Signed)
LCSW sent link at 2:15pm due to unable to log in and check in due to Epic difficulties, waited until 2:30pm to call client and left VM to call back clinic/or google voice if wanted to continue with session or reschedule. It should be noted patient did confirm session the day prior with LCSW. On phone call on 06.21 LCSW did inform if she wanted to keep appointment even with LCSW leaving 07/15, patient had expressed was okay with this. LCSW documented attempt outreach in [place] as well.   It should be noted this is not a therapy note due to unable to begin clinical assessment.

## 2021-03-12 ENCOUNTER — Ambulatory Visit: Payer: Medicaid Other | Admitting: Physical Therapy

## 2021-03-15 NOTE — Progress Notes (Signed)
Cardiology Office Note   Date:  03/16/2021   ID:  Chenelle, Benning 1980-11-24, MRN [id number]  PCP:  Mack Hook, MD  Cardiologist:   None Referring:  Mack Hook, MD  Chief Complaint  Patient presents with   Palpitations       History of Present Illness: Stacy Jackson is a 40 y.o. female who was referred by Mack Hook, MD fo evaluation of palpitations.   An echo was unremarkable.  Since that time she has felt her palpitations.  She feels her heart running away from her.  It catches in her chest.  She did wear a monitor and had lots of symptoms including syncope lightheadedness and chest pain.  Most of this happened was sinus tachycardia.  She says she is still having poor exercise tolerance.  He is not able to do any running without getting a "catch" in her chest.  She has a vague discomfort.  She is not having any PND or orthopnea.  She has not had any further syncope.   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   Seizures ([place])    Ventricular tachycardia, non-sustained ([place]) 03/11/2021    Past Surgical History:  Procedure Laterality Date   CESAREAN SECTION  2003, 2008   2nd with left oophorectomy   COLONOSCOPY WITH PROPOFOL N/A 11/22/2016   Procedure: COLONOSCOPY WITH PROPOFOL;  Surgeon: Arta Silence, MD;   [facility];  Service: Endoscopy;  Laterality: N/A;   ESOPHAGOGASTRODUODENOSCOPY  01/13/2015   Normal biopsy of gastric mucosa/gastric pouch   NISSEN FUNDOPLICATION  8413   OOPHORECTOMY Left 2008   Done at same time of Csection   ROUX-EN-Y GASTRIC BYPASS  ?with Nissen fundoplication   Unknown year--found in GI records from [facility] as bariatric procedure.   VAGINAL HYSTERECTOMY  2015   For cervical cancer unresponsive to LEEP     Current Outpatient Medications  Medication Sig Dispense Refill   busPIRone (BUSPAR) 5 MG tablet Take 1 tablet (5 mg total) by mouth 3 (three) times daily. 90 tablet 4   clobetasol ointment (TEMOVATE) 0.05 % Apply to affected areas twice daily as needed 60 g 2   cyanocobalamin 100 MCG tablet Take 1,000 mcg by mouth daily.     cyclobenzaprine (FLEXERIL) 10 MG tablet 1/2 to 1 tab by mouth at bedtime as needed 30 tablet 4   dicyclomine (BENTYL) 10 MG capsule 1 cap by mouth every 8 hours as needed 60 capsule 2   EPINEPHrine (EPIPEN 2-PAK) 0.3 mg/0.3 mL IJ SOAJ injection Inject 0.3 mg into the muscle as needed for anaphylaxis. 1 each 1   famotidine (PEPCID) 20 MG tablet 3 tablets by mouth 1 hour before breakfast 90 tablet 11   fexofenadine (ALLEGRA) 180 MG tablet 1 tab by mouth daily as needed 30 tablet 11   gabapentin (  NEURONTIN) 400 MG capsule TAKE 1 CAPSULE BY MOUTH THREE TIMES DAILY 90 capsule 11   Iron Polysacch Cmplx-B12-FA 150-0.025-1 MG CAPS 1 cap by mouth daily 30 capsule 11   levETIRAcetam (KEPPRA) 1000 MG tablet Take 1 tablet by mouth twice daily 60 tablet 10   methotrexate (RHEUMATREX) 2.5 MG tablet Take 7.5 mg by mouth once a week. Sundays or Mondays     naproxen sodium (ALEVE) 220 MG tablet Take 220 mg by mouth daily as needed (pain).     TREMFYA 100 MG/ML SOSY Inject 1 mL into the skin every 8 (eight) weeks.     venlafaxine XR (EFFEXOR XR) 75 MG 24 hr capsule Take 1 capsule (75 mg total) by mouth daily with breakfast. 60 capsule 2    metoprolol succinate (TOPROL-XL) 50 MG 24 hr tablet Take 1 tablet (50 mg total) by mouth 2 (two) times daily. Take with or immediately following a meal. 180 tablet 3   No current facility-administered medications for this visit.    Allergies:   Shrimp [shellfish allergy]    ROS:  Please see the history of present illness.   Otherwise, review of systems are positive for none.   All other systems are reviewed and negative.    PHYSICAL EXAM: VS:  BP (!) 122/96   Pulse 92   Ht 4\' 11"  (1.499 m)   Wt 125 lb 6.4 oz (56.9 kg)   LMP 12/14/2012   SpO2 98%   BMI 25.33 kg/m  , BMI Body mass index is 25.33 kg/m. GENERAL:  Well appearing NECK:  No jugular venous distention, waveform within normal limits, carotid upstroke brisk and symmetric, no bruits, positive thyromegaly LUNGS:  Clear to auscultation bilaterally CHEST:  Unremarkable HEART:  PMI not displaced or sustained,S1 and S2 within normal limits, no S3, no S4, no clicks, no rubs, no murmurs ABD:  Flat, positive bowel sounds normal in frequency in pitch, no bruits, no rebound, no guarding, no midline pulsatile mass, no hepatomegaly, no splenomegaly EXT:  2 plus pulses throughout, no edema, no cyanosis no clubbing   EKG:  EKG is not ordered today.   Recent Labs: 12/16/2020: BNP 22.2 01/22/2021: Magnesium 2.1 02/05/2021: Hemoglobin 10.3; Platelets 443 02/27/2021: ALT 17; BUN 12; Creatinine, Ser 0.63; Potassium 3.8; Sodium 145; TSH 0.612    Lipid Panel    Component Value Date/Time   CHOL 245 (H) 10/29/2020 1118   TRIG 56 10/29/2020 1118   HDL 113 10/29/2020 1118   LDLCALC 123 (H) 10/29/2020 1118      Wt Readings from Last 3 Encounters:  03/16/21 125 lb 6.4 oz (56.9 kg)  02/27/21 131 lb (59.4 kg)  02/18/21 127 lb (57.6 kg)      Other studies Reviewed: Additional studies/ records that were reviewed today include: Labs. Review of the above records demonstrates:  Please see elsewhere in the note.     ASSESSMENT AND  PLAN:  PALPITATIONS : I am going to bring her back for a POET (Plain Old Exercise Treadmill) as some of her symptoms are reproducible with exercise.  She also has some nonsustained ventricular tachycardia but with structurally normal heart on echo.  I am going to symptomatically manage her with increase metoprolol to XL 50 mg twice daily.    SOB:    BNP was normal .  Normal.  No further work-up other than above.  CHEST PAIN:  Echo was unremarkable.  This will be evaluated as above.  I have a low suspicion for obstructive coronary  disease however.   Current medicines are reviewed at length with the patient today.  The patient does not have concerns regarding medicines.  The following changes have been made: As above  Labs/ tests ordered today include:    Orders Placed This Encounter  Procedures   Exercise Tolerance Test      Disposition:   FU with APP in 3 months   Signed, Minus Breeding, MD  03/16/2021 11:48 AM    [place]

## 2021-03-16 ENCOUNTER — Encounter: Payer: Self-pay | Admitting: Cardiology

## 2021-03-16 ENCOUNTER — Other Ambulatory Visit: Payer: Self-pay

## 2021-03-16 ENCOUNTER — Other Ambulatory Visit: Payer: Medicaid Other | Admitting: Internal Medicine

## 2021-03-16 ENCOUNTER — Ambulatory Visit (INDEPENDENT_AMBULATORY_CARE_PROVIDER_SITE_OTHER): Payer: Medicaid Other | Admitting: Cardiology

## 2021-03-16 VITALS — BP 122/96 | HR 92 | Ht 59.0 in | Wt 125.4 lb

## 2021-03-16 DIAGNOSIS — R002 Palpitations: Secondary | ICD-10-CM | POA: Diagnosis not present

## 2021-03-16 DIAGNOSIS — R0602 Shortness of breath: Secondary | ICD-10-CM

## 2021-03-16 DIAGNOSIS — R072 Precordial pain: Secondary | ICD-10-CM | POA: Diagnosis not present

## 2021-03-16 MED ORDER — METOPROLOL SUCCINATE ER 50 MG PO TB24: 50.0000 mg | ORAL_TABLET | Freq: Two times a day (BID) | ORAL | 3 refills | Status: AC

## 2021-03-16 NOTE — Patient Instructions (Addendum)
Medication Instructions:   INCREASE METOPROLOL TO 50 MG TWICE DAILY= 2 OF THE 25 MG TABLETS TWICE DAILY  *If you need a refill on your cardiac medications before your next appointment, please call your pharmacy*  Testing/Procedures:  Your physician has requested that you have an exercise tolerance test. For further information please visit [email or web address]. Please also follow instruction sheet, as given. DO NOT TAKE METOPROLOL THE NIGHT BEFORE OR THE DAY OF THE PROCEDURE.   Follow-Up: At [facility], you and your health needs are our priority.  As part of our continuing mission to provide you with exceptional heart care, we have created designated Provider Care Teams.  These Care Teams include your primary Cardiologist (physician) and Advanced Practice Providers (APPs -  Physician Assistants and Nurse Practitioners) who all work together to provide you with the care you need, when you need it.  We recommend signing up for the patient portal called "MyChart".  Sign up information is provided on this After Visit Summary.  MyChart is used to connect with patients for Virtual Visits (Telemedicine).  Patients are able to view lab/test results, encounter notes, upcoming appointments, etc.  Non-urgent messages can be sent to your provider as well.   To learn more about what you can do with MyChart, go to [email or web address].    Your next appointment:   3 month(s)  The format for your next appointment:   In Person  Provider:   You will see one of the following Advanced Practice Providers on your designated Care Team:   Rosaria Ferries, PA-C Jory Sims, DNP, ANP

## 2021-03-16 NOTE — Telephone Encounter (Signed)
Dr Amil Amen spoke with the pt and explain reasoning for not meeting criteria for glucometer

## 2021-03-17 ENCOUNTER — Other Ambulatory Visit: Payer: Self-pay | Admitting: *Deleted

## 2021-03-17 DIAGNOSIS — R072 Precordial pain: Secondary | ICD-10-CM

## 2021-03-19 ENCOUNTER — Telehealth (HOSPITAL_COMMUNITY): Payer: Self-pay | Admitting: *Deleted

## 2021-03-19 NOTE — Telephone Encounter (Signed)
Close encounter 

## 2021-03-20 ENCOUNTER — Other Ambulatory Visit: Payer: Self-pay

## 2021-03-20 ENCOUNTER — Ambulatory Visit (HOSPITAL_COMMUNITY)
Admission: RE | Admit: 2021-03-20 | Discharge: 2021-03-20 | Disposition: A | Discharge status: Home / Self Care | Payer: Medicaid Other | Source: Ambulatory Visit | Attending: Cardiovascular Disease | Admitting: Cardiovascular Disease

## 2021-03-20 DIAGNOSIS — R0602 Shortness of breath: Secondary | ICD-10-CM | POA: Diagnosis not present

## 2021-03-20 DIAGNOSIS — R072 Precordial pain: Secondary | ICD-10-CM | POA: Insufficient documentation

## 2021-03-20 LAB — EXERCISE TOLERANCE TEST
Estimated workload: 10.1 METS
Exercise duration (min): 8 min
Exercise duration (sec): 10 s
MPHR: 181 {beats}/min
Peak HR: 181 {beats}/min
Percent HR: 100 %
Rest HR: 85 {beats}/min

## 2021-04-20 ENCOUNTER — Other Ambulatory Visit: Payer: Self-pay | Admitting: Internal Medicine

## 2021-04-27 ENCOUNTER — Encounter: Payer: Self-pay | Admitting: Internal Medicine

## 2021-04-27 ENCOUNTER — Telehealth: Payer: Self-pay | Admitting: Internal Medicine

## 2021-04-27 NOTE — Telephone Encounter (Signed)
Normal treadmill stress test noted. Called and spoke with Dr. 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  She has told others she has sickle cell disease as well, which was negated previously with a normal hemoglobin electrophoresis. She more recently is focused on getting glucose monitoring equipment despite having a normal A1C and years of normal glucoses since losing weight with Roux en Y bariatric surgery. Her social history certainly also sets her up for this disorder.   Dr. Sharol Roussel shared psoriasis is a clinical diagnosis and he does not biopsy unless dealing with never treated lesion.  She apparently complained of typical psoriatic joint discomfort when seen.   Her skin lesions were quite numerous when she was sent to derm and have resolved, but was concerned with her taking MTX and Cosentyx and the possibility that some of her history, including joint complaints, is not factual.   Discussed we are continuing to monitor the situation  Ms. Gagel never followed up with our LCSW for counseling in the past nor for ETOH abuse in recent past.    She does have an appt with Dr  Sima Matas, [place] 07/08/2021, who can perhaps help define what Ms. Siegel is dealing with.  Her history with her mildly elevated Lipase was clarified in past spring/summer when her aunt began accompanying her to visits and shared she was using alcohol in large amounts frequently and that had been a problem for her in the past.

## 2021-04-30 NOTE — Progress Notes (Signed)
Pt. Informed of result. Lab appointment scheduled for 07/01/21 at 10:30 am

## 2021-05-08 ENCOUNTER — Ambulatory Visit: Payer: Medicaid Other | Admitting: Internal Medicine

## 2021-05-08 ENCOUNTER — Encounter: Payer: Self-pay | Admitting: Internal Medicine

## 2021-05-08 ENCOUNTER — Other Ambulatory Visit: Payer: Self-pay

## 2021-05-08 VITALS — BP 100/70 | HR 88 | Resp 12 | Ht 59.0 in | Wt 138.0 lb

## 2021-05-08 DIAGNOSIS — F1010 Alcohol abuse, uncomplicated: Secondary | ICD-10-CM

## 2021-05-08 DIAGNOSIS — F419 Anxiety disorder, unspecified: Secondary | ICD-10-CM

## 2021-05-08 NOTE — Patient Instructions (Addendum)
The [employer] development service in [place], [place]  Address: [address], [place], [place] [address] Hours:  Closes soon ? 5PM ? Caesar Bookman Phone: [phone]  Check out [place]  Call [facility] and see if someone can give a lead on a rental property near [employer] of [place] at one of the [place], [place] or [facility] stores--generally free.

## 2021-05-08 NOTE — Progress Notes (Unsigned)
    Subjective:    Patient ID: Stacy Jackson, female   DOB: 02-11-1981, 40 y.o.   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 Sima Matas of [facility] on 9/21 now, moved up from October.    2.   Depression and anxiety:  Some days fine, but other days, "scraping the ground"  and anxiety and just not able to focus.  No thoughts of suicide.  States she is not drinking alcohol much now  Tired of her family checking in on her alcohol intake. Feels they are always questioning her behavior when she is having a good day and wondering if she is drinking.    3.  Daughter now going to [employer] and looking for a home closer to her daughter's school.  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    Current Meds  Medication Sig   busPIRone (BUSPAR) 5 MG tablet TAKE 1 TABLET BY MOUTH THREE TIMES DAILY   clobetasol ointment (TEMOVATE) 0.05 % Apply to affected areas twice daily as needed   cyclobenzaprine (FLEXERIL) 10 MG tablet 1/2 to 1 tab by mouth at bedtime as needed   dicyclomine (BENTYL) 10 MG capsule 1 cap by mouth every 8 hours as needed   EPINEPHrine (EPIPEN 2-PAK) 0.3 mg/0.3 mL IJ SOAJ injection Inject 0.3 mg into the muscle as needed for anaphylaxis.   famotidine (PEPCID) 20 MG tablet TAKE 3 TABLETS BY MOUTH ONCE DAILY 1  HOUR  BEFORE  BREAKFAST   fexofenadine (ALLEGRA) 180 MG tablet 1 tab by mouth daily as needed   gabapentin (NEURONTIN) 400 MG capsule TAKE 1 CAPSULE BY MOUTH THREE TIMES DAILY   Iron Polysacch Cmplx-B12-FA 150-0.025-1 MG CAPS 1 cap by mouth daily   levETIRAcetam (KEPPRA) 1000 MG tablet Take 1 tablet by mouth twice daily   methotrexate (RHEUMATREX) 2.5 MG tablet Take 7.5 mg by mouth once a week. Sundays or Mondays   metoprolol succinate (TOPROL-XL) 50 MG 24 hr tablet Take 1 tablet (50 mg total) by mouth 2 (two) times daily. Take with or immediately following a meal.   naproxen sodium (ALEVE) 220 MG tablet Take  220 mg by mouth daily as needed (pain).   venlafaxine XR (EFFEXOR XR) 75 MG 24 hr capsule Take 1 capsule (75 mg total) by mouth daily with breakfast.   [DISCONTINUED] Secukinumab (COSENTYX SENSOREADY PEN) 150 MG/ML SOAJ Inject 2 mLs into the skin. Once every 4 weeks   Allergies  Allergen Reactions   Shrimp [Shellfish Allergy] Anaphylaxis     Review of Systems    Objective:   BP 100/70 (BP Location: Right Arm, Patient Position: Sitting, Cuff Size: Normal)   Pulse 88   Resp 12   Ht '4\' 11"'$  (1.499 m)   Wt 138 lb (62.6 kg)   LMP 12/14/2012   BMI 27.87 kg/m   Physical Exam   Assessment & Plan   ***

## 2021-05-21 NOTE — Progress Notes (Signed)
Cardiology Office Note   Date:  05/22/2021   ID:  Stacy Jackson, DOB 12-Oct-1980, MRN [id number]  PCP:  Stacy Hook, MD  Cardiologist:  Dr. Percival Jackson  No chief complaint on file.    History of Present Illness: Stacy Jackson is a 40 y.o. female who presents for ongoing assessment and management of palpitations.  Patient was originally seen by Dr. Percival Jackson on 03/16/2021 at the request of her primary care physician Dr. Amil Jackson.  Dr. Percival Jackson noted that she did wear a monitor in which she noted a lot of symptoms to include syncope lightheadedness and chest pain.  This was in correlation with sinus tachycardia on the monitor.  She complained of poor exercise tolerance and not able to do any running without getting "a catch in her chest"  Dr. Percival Jackson scheduled her for a exercise stress test to evaluate if some of her symptoms were reproducible with exercise.  He also increased her metoprolol to metoprolol XL 50 mg twice daily.   Exercise stress test was completed on 03/25/2021.  This was negative for ischemia with an average functional capacity of 10.1 METS with normal heart rate and blood pressure response to exercise.  She comes today with continued complaints of palpitations but they are have been significantly improved with use of metoprolol.  She denies any syncope near syncope, continued effects of heart racing, or postural tachycardia.  She does state that she feels palpitations and feeling as if she needs to take a deep breath sometimes when the symptoms occur.  She states she feels them a couple times a week only now.  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   Seizures ([place])    Ventricular tachycardia, non-sustained ([place]) 03/11/2021    Past Surgical History:  Procedure Laterality Date   CESAREAN SECTION  2003, 2008   2nd with left oophorectomy   COLONOSCOPY WITH PROPOFOL N/A 11/22/2016   Procedure: COLONOSCOPY WITH PROPOFOL;  Surgeon: Arta Silence, MD;  Location: [facility];  Service: Endoscopy;  Laterality: N/A;   ESOPHAGOGASTRODUODENOSCOPY  01/13/2015   Normal biopsy of gastric mucosa/gastric pouch   NISSEN FUNDOPLICATION  0000000   OOPHORECTOMY Left 2008   Done at same time of Csection   ROUX-EN-Y GASTRIC BYPASS  ?with Nissen fundoplication   Unknown year--found in GI records from [facility] as bariatric procedure.   VAGINAL HYSTERECTOMY  2015   For cervical cancer unresponsive to LEEP     Current Outpatient Medications  Medication Sig Dispense Refill   busPIRone (BUSPAR) 5 MG tablet TAKE 1 TABLET BY MOUTH THREE TIMES DAILY 90 tablet 3   clobetasol ointment (TEMOVATE) 0.05 % Apply to affected areas twice daily as needed 60 g 2   COSENTYX SENSOREADY, 300 MG, 150 MG/ML SOAJ Inject 300  mg into the skin every 30 (thirty) days.     cyclobenzaprine (FLEXERIL) 10 MG tablet 1/2 to 1 tab by mouth at bedtime as needed 30 tablet 4   dicyclomine (BENTYL) 10 MG capsule 1 cap by mouth every 8 hours as needed 60 capsule 2   EPINEPHrine (EPIPEN 2-PAK) 0.3 mg/0.3 mL IJ SOAJ injection Inject 0.3 mg into the muscle as needed for anaphylaxis. 1 each 1   famotidine (PEPCID) 20 MG tablet TAKE 3 TABLETS BY MOUTH ONCE DAILY 1  HOUR  BEFORE  BREAKFAST 90 tablet 3   fexofenadine (ALLEGRA)  180 MG tablet 1 tab by mouth daily as needed 30 tablet 11   folic acid (FOLVITE) 1 MG tablet Take 1 tablet by mouth daily.     gabapentin (NEURONTIN) 400 MG capsule TAKE 1 CAPSULE BY MOUTH THREE TIMES DAILY 90 capsule 11   Iron Polysacch Cmplx-B12-FA 150-0.025-1 MG CAPS 1 cap by mouth daily 30 capsule 11   levETIRAcetam (KEPPRA) 1000 MG tablet Take 1 tablet by mouth twice daily 60 tablet 10   methotrexate (RHEUMATREX) 2.5 MG tablet Take 7.5 mg by mouth once a week. Sundays or Mondays     metoprolol succinate (TOPROL-XL) 50 MG 24 hr tablet Take 1 tablet (50 mg total) by mouth 2 (two) times daily. Take with or immediately following a meal. 180 tablet 3   naproxen sodium (ALEVE) 220 MG tablet Take 220 mg by mouth daily as needed (pain).     venlafaxine XR (EFFEXOR XR) 75 MG 24 hr capsule Take 1 capsule (75 mg total) by mouth daily with breakfast. 60 capsule 2   No current facility-administered medications for this visit.    Allergies:   Shrimp [shellfish allergy]    Social History:  The patient  reports that she has quit smoking. Her smoking use included cigarettes. She has a 0.50 pack-year smoking history. She has never used smokeless tobacco. She reports current alcohol use. She reports that she does not use drugs.   Family History:  The patient's family history includes Asthma in her brother and brother; Autism spectrum disorder in her son; Breast cancer in her maternal grandmother; Diabetes in her brother and father; Eczema in her brother and son; Hypertension in her brother, father, and mother; Renal cancer in her mother; Sickle cell trait in her daughter; Ulcerative colitis in her father.    ROS: All other systems are reviewed and negative. Unless otherwise mentioned in H&P    PHYSICAL EXAM: VS:  BP 110/70   Pulse 86   Resp 20   Ht '4\' 11"'$  (1.499 m)   Wt 151 lb 12.8 oz (68.9 kg)   LMP 12/14/2012   SpO2 95%   BMI 30.66 kg/m  , BMI Body mass index is 30.66 kg/m. GEN: Well  nourished, well developed, in no acute distress HEENT: normal Neck: no JVD, carotid bruits, or masses Cardiac: RRR; no murmurs, rubs, or gallops,no edema  Respiratory:  Clear to auscultation bilaterally, normal work of breathing GI: soft, nontender, nondistended, + BS MS: no deformity or atrophy Skin: warm and dry, no rash Neuro:  Strength and sensation are intact Psych: euthymic mood, full affect   EKG:  EKG is ordered today. The ekg ordered today demonstrates (personally reviewed) normal sinus rhythm heart rate of 86 bpm possible left atrial enlargement.  No evidence of WPW.  Recent Labs: 12/16/2020: BNP 22.2 01/22/2021: Magnesium 2.1 02/05/2021: Hemoglobin 10.3; Platelets 443 02/27/2021: ALT 17; BUN 12; Creatinine, Ser 0.63; Potassium 3.8; Sodium  145; TSH 0.612    Lipid Panel    Component Value Date/Time   CHOL 245 (H) 10/29/2020 1118   TRIG 56 10/29/2020 1118   HDL 113 10/29/2020 1118   LDLCALC 123 (H) 10/29/2020 1118      Wt Readings from Last 3 Encounters:  05/22/21 151 lb 12.8 oz (68.9 kg)  05/08/21 138 lb (62.6 kg)  03/16/21 125 lb 6.4 oz (56.9 kg)      Other studies Reviewed:     Exercise Stress Test: 03/20/2021 There was no ST segment deviation noted during stress. No T wave inversion was noted during stress.   1.  Negative EKG stress test for ischemia. 2.  Average functional capacity (8:10 min:sec; 10.1 METS).  3.  Normal HR/BP response to exercise.  4.  This is a low-risk study.   Echocardiogram 01/15/2021 1. Left ventricular ejection fraction, by estimation, is 60 to 65%. The  left ventricle has normal function. The left ventricle has no regional  wall motion abnormalities. Left ventricular diastolic parameters were  normal. The average left ventricular  global longitudinal strain is 22.3 %. The global longitudinal strain is  normal.   2. Right ventricular systolic function is normal. The right ventricular  size is normal. There is normal pulmonary artery  systolic pressure.   3. Left atrial size was mildly dilated.   4. The mitral valve is normal in structure. No evidence of mitral valve  regurgitation. No evidence of mitral stenosis.   5. The aortic valve is grossly normal. Aortic valve regurgitation is not  visualized. No aortic stenosis is present.   6. The inferior vena cava is normal in size with greater than 50%  respiratory variability, suggesting right atrial pressure of 3 mmHg.   ASSESSMENT AND PLAN:  1.  Frequent palpitations: These have markedly improved with use of metoprolol XL 50 mg.  She continues to have some breakthrough palpitations but they are short-lived.  Stress test and echocardiogram are reassuring.  I will continue her current medication regimen.  She will follow-up with primary care and see [place] in 1 year unless symptomatic.  I have advised her to increase her fluid intake, and avoid caffeine.  2.  Nodular Thyroid disease:  Followed by PCP.  Most recent labs reveal normal TSH values.  3.  GERD: Controlled currently on H2 blocker. Current medicines are reviewed at length with the patient today.  I have spent 25 min's dedicated to the care of this patient on the date of this encounter to include pre-visit review of records, assessment, management and diagnostic testing,with shared decision making.  Labs/ tests ordered today include: none Phill Myron. West Pugh, ANP, [facility]   05/22/2021 11:27 AM    [facility] [place] [address] Office [phone] Fax [phone]  Notice: This dictation was prepared with Dragon dictation along with smaller phrase technology. Any transcriptional errors that result from this process are unintentional and may not be corrected upon review.

## 2021-05-22 ENCOUNTER — Encounter: Payer: Self-pay | Admitting: Adult Health

## 2021-05-22 ENCOUNTER — Ambulatory Visit (INDEPENDENT_AMBULATORY_CARE_PROVIDER_SITE_OTHER): Payer: Medicaid Other | Admitting: Adult Health

## 2021-05-22 ENCOUNTER — Other Ambulatory Visit: Payer: Self-pay

## 2021-05-22 VITALS — BP 110/70 | HR 86 | Resp 20 | Ht 59.0 in | Wt 151.8 lb

## 2021-05-22 DIAGNOSIS — K219 Gastro-esophageal reflux disease without esophagitis: Secondary | ICD-10-CM | POA: Diagnosis not present

## 2021-05-22 DIAGNOSIS — R002 Palpitations: Secondary | ICD-10-CM | POA: Diagnosis not present

## 2021-05-22 NOTE — Patient Instructions (Signed)
Medication Instructions:  No Changes *If you need a refill on your cardiac medications before your next appointment, please call your pharmacy*   Lab Work: No Labs If you have labs (blood work) drawn today and your tests are completely normal, you will receive your results only by: [place] (if you have MyChart) OR A paper copy in the mail If you have any lab test that is abnormal or we need to change your treatment, we will call you to review the results.   Testing/Procedures: No Testing   Follow-Up: At [facility], you and your health needs are our priority.  As part of our continuing mission to provide you with exceptional heart care, we have created designated Provider Care Teams.  These Care Teams include your primary Cardiologist (physician) and Advanced Practice Providers (APPs -  Physician Assistants and Nurse Practitioners) who all work together to provide you with the care you need, when you need it.  We recommend signing up for the patient portal called "MyChart".  Sign up information is provided on this After Visit Summary.  MyChart is used to connect with patients for Virtual Visits (Telemedicine).  Patients are able to view lab/test results, encounter notes, upcoming appointments, etc.  Non-urgent messages can be sent to your provider as well.   To learn more about what you can do with MyChart, go to [email or web address].    Your next appointment:   1 year(s)  The format for your next appointment:   In Person  Provider:   Minus Breeding, MD

## 2021-06-10 ENCOUNTER — Encounter: Payer: Medicaid Other | Attending: Psychology | Admitting: Psychology

## 2021-06-10 ENCOUNTER — Other Ambulatory Visit: Payer: Self-pay

## 2021-06-10 DIAGNOSIS — F410 Panic disorder [episodic paroxysmal anxiety] without agoraphobia: Secondary | ICD-10-CM

## 2021-06-10 DIAGNOSIS — R419 Unspecified symptoms and signs involving cognitive functions and awareness: Secondary | ICD-10-CM | POA: Diagnosis not present

## 2021-06-10 DIAGNOSIS — F09 Unspecified mental disorder due to known physiological condition: Secondary | ICD-10-CM | POA: Diagnosis not present

## 2021-06-10 DIAGNOSIS — F419 Anxiety disorder, unspecified: Secondary | ICD-10-CM | POA: Diagnosis not present

## 2021-06-10 DIAGNOSIS — F418 Other specified anxiety disorders: Secondary | ICD-10-CM

## 2021-06-10 DIAGNOSIS — F32A Depression, unspecified: Secondary | ICD-10-CM

## 2021-07-01 ENCOUNTER — Other Ambulatory Visit: Payer: Medicaid Other | Admitting: Internal Medicine

## 2021-07-02 ENCOUNTER — Other Ambulatory Visit: Payer: Medicaid Other

## 2021-07-08 ENCOUNTER — Ambulatory Visit: Payer: Medicaid Other | Admitting: Podiatry

## 2021-07-08 ENCOUNTER — Ambulatory Visit: Payer: Medicaid Other | Admitting: Psychology

## 2021-07-09 ENCOUNTER — Encounter: Payer: Medicaid Other | Attending: Psychology

## 2021-07-09 ENCOUNTER — Other Ambulatory Visit: Payer: Self-pay

## 2021-07-09 DIAGNOSIS — F419 Anxiety disorder, unspecified: Secondary | ICD-10-CM | POA: Diagnosis present

## 2021-07-09 DIAGNOSIS — F32A Depression, unspecified: Secondary | ICD-10-CM | POA: Insufficient documentation

## 2021-07-09 DIAGNOSIS — F09 Unspecified mental disorder due to known physiological condition: Secondary | ICD-10-CM | POA: Diagnosis not present

## 2021-07-09 NOTE — Progress Notes (Signed)
Behavioral Observation The patient arrived 45 minutes late to the testing session. Due to the delayed start time, testing will be broken up into two sessions.The patient appeared well-groomed and appropriately dressed. Her manners were polite and appropriate to the situation. Her attitude toward testing was positive and she showed good effort.   Neuropsychology Note  Stacy Jackson completed 150 minutes of neuropsychological testing with technician, Dina Rich, BA, under the supervision of Ilean Skill, PsyD., Clinical Neuropsychologist. The patient did not appear overtly distressed by the testing session, per behavioral observation or via self-report to the technician. Rest breaks were offered.   Clinical Decision Making: In considering the patient's current level of functioning, level of presumed impairment, nature of symptoms, emotional and behavioral responses during clinical interview, level of literacy, and observed level of motivation/effort, a battery of tests was selected by Dr. Sima Matas during initial consultation on 06/10/2021. This was communicated to the technician. Communication between the neuropsychologist and technician was ongoing throughout the testing session and changes were made as deemed necessary based on patient performance on testing, technician observations and additional pertinent factors such as those listed above.  Tests Administered: Finger Tapping Test (FTT) Grooved Pegboard Hand Dynamometer  Wechsler Adult Intelligence Scale, 4th Edition (WAIS-IV)  Results:  WAIS-IV  Composite Score Summary  Scale Sum of Scaled Scores Composite Score Percentile Rank 95% Conf. Interval Qualitative Description  Verbal Comprehension 28 VCI 96 39 91-102 Average  Perceptual Reasoning 22 PRI 84 14 79-91 Low Average  Working Memory 15 WMI 86 18 80-94 Low Average  Processing Speed 15 PSI 86 18 79-96 Low Average  Full Scale 80 FSIQ 86 18 82-90 Low Average   General Ability 50 GAI 89 23 84-94 Low Average      Verbal Comprehension Subtests Summary  Subtest Raw Score Scaled Score Percentile Rank Reference Group Scaled Score SEM  Similarities 26 10 50 10 1.04  Vocabulary 41 11 63 12 0.73  Information 9 7 16 8  0.85  (Comprehension) 27 11 63 12 0.99       Perceptual Reasoning Subtests Summary  Subtest Raw Score Scaled Score Percentile Rank Reference Group Scaled Score SEM  Block Design 28 7 16 6  0.99  Matrix Reasoning 19 10 50 10 0.95  Visual Puzzles 7 5 5 5  1.04  (Figure Weights) 15 10 50 10 0.99  (Picture Completion) 8 6 9 6  1.20       Working Doctor, general practice Raw Score Scaled Score Percentile Rank Reference Group Scaled Score SEM  Digit Span 29 10 50 10 0.73  Arithmetic 8 5 5 5  0.99  (Letter-Number Seq.) 24 14 91 14 1.12       Processing Speed Subtests Summary  Subtest Raw Score Scaled Score Percentile Rank Reference Group Scaled Score SEM  Symbol Search 19 5 5 5  1.56  Coding 72 10 50 10 1.20  (Cancellation) 19 4 2 3  1.62      Finger Tapping Test Right Hand: 28 37 39 42 38 41 44 44 44 42   Left Hand: 27 41 44 46 50 44 44 46 46 47     Hand Dynamometer Right Hand: 31 29  Left Hand: 35 32    Grooved Pegboard Right Hand: Time = 1:36.00 # Drops = 1  Left Hand: Time = 1:53.73 # Drops = 2     Feedback to Patient: Stacy Jackson will return on 07/16/2021 to complete testing and again on 10/29/2020 for an interactive feedback session with  Dr. Sima Matas at which time her test performances, clinical impressions and treatment recommendations will be reviewed in detail. The patient understands she can contact our office should she require our assistance before this time.  150 minutes spent face-to-face with patient administering standardized tests, 30 minutes spent scoring Environmental education officer). [CPT Y8200648, 61848]  Full report to follow.

## 2021-07-10 ENCOUNTER — Other Ambulatory Visit (INDEPENDENT_AMBULATORY_CARE_PROVIDER_SITE_OTHER): Payer: Medicaid Other

## 2021-07-10 ENCOUNTER — Ambulatory Visit (INDEPENDENT_AMBULATORY_CARE_PROVIDER_SITE_OTHER): Payer: Medicaid Other | Admitting: Podiatry

## 2021-07-10 ENCOUNTER — Ambulatory Visit (INDEPENDENT_AMBULATORY_CARE_PROVIDER_SITE_OTHER): Payer: Medicaid Other

## 2021-07-10 DIAGNOSIS — M21619 Bunion of unspecified foot: Secondary | ICD-10-CM | POA: Diagnosis not present

## 2021-07-10 DIAGNOSIS — M79671 Pain in right foot: Secondary | ICD-10-CM

## 2021-07-10 DIAGNOSIS — R5383 Other fatigue: Secondary | ICD-10-CM | POA: Diagnosis not present

## 2021-07-10 DIAGNOSIS — M7751 Other enthesopathy of right foot: Secondary | ICD-10-CM

## 2021-07-10 MED ORDER — TRIAMCINOLONE ACETONIDE 10 MG/ML IJ SUSP
10.0000 mg | Freq: Once | INTRAMUSCULAR | Status: AC
  Administered 2021-07-10: 10 mg

## 2021-07-10 NOTE — Patient Instructions (Signed)

## 2021-07-11 LAB — TSH: TSH: 1.01 u[IU]/mL (ref 0.450–4.500)

## 2021-07-11 LAB — T3, FREE: T3, Free: 2.1 pg/mL (ref 2.0–4.4)

## 2021-07-11 LAB — T4, FREE: Free T4: 0.72 ng/dL — ABNORMAL LOW (ref 0.82–1.77)

## 2021-07-13 NOTE — Progress Notes (Signed)
Subjective:   Patient ID: Stacy Jackson, female   DOB: 40 y.o.   MRN: [id number]   HPI Patient presents with swelling and pain around the big toe joint right and states it is worse with shoes.  Patient has tried icy hot Ace wraps and does not remember specific injury with prominence around the bone and patient does not smoke at this time and would like to be more active   Review of Systems  All other systems reviewed and are negative.      Objective:  Physical Exam Vitals and nursing note reviewed.  Constitutional:      Appearance: Stacy Jackson is well-developed.  Pulmonary:     Effort: Pulmonary effort is normal.  Musculoskeletal:        General: Normal range of motion.  Skin:    General: Skin is warm.  Neurological:     Mental Status: Stacy Jackson is alert.    Neurovascular status found to be intact muscle strength found to be adequate range of motion adequate.  Patient does have prominence around the first metatarsal head right with inflammation fluid buildup around the joint with no active redness or swelling edema associated with this.  Patient has good digital perfusion well oriented x3     Assessment:  Appears to be inflammatory condition with the possibility systemic but most likely local with structural bunion deformity     Plan:  H&P reviewed condition discussed inflammatory condition and at this point did sterile prep and injected around the first MPJ 3 mg Kenalog 5 mg Xylocaine and advised on wider shoes and soaks.  Patient may require structural bunion correction if symptoms do not get better and I advised her of that and the type of procedure Stacy Jackson would need with distal type osteotomy  X-rays did indicate elevation of the intermetatarsal angle and did not indicate arthritis or what appear to be any form of osteolysis

## 2021-07-16 ENCOUNTER — Other Ambulatory Visit: Payer: Self-pay

## 2021-07-16 ENCOUNTER — Other Ambulatory Visit: Payer: Self-pay | Admitting: Podiatry

## 2021-07-16 DIAGNOSIS — F419 Anxiety disorder, unspecified: Secondary | ICD-10-CM | POA: Diagnosis not present

## 2021-07-16 DIAGNOSIS — F09 Unspecified mental disorder due to known physiological condition: Secondary | ICD-10-CM

## 2021-07-16 DIAGNOSIS — M7751 Other enthesopathy of right foot: Secondary | ICD-10-CM

## 2021-07-16 DIAGNOSIS — F32A Depression, unspecified: Secondary | ICD-10-CM

## 2021-07-16 NOTE — Progress Notes (Signed)
Behavioral Observation  The patient appeared well-groomed and appropriately dressed. Her manners were polite and appropriate to the situation. She demonstrated a positive attitude toward testing and showed good effort.  Neuropsychology Note  Loray Akard completed 90 minutes of neuropsychological testing with technician, Dina Rich, BA, under the supervision of Ilean Skill, PsyD., Clinical Neuropsychologist. The patient did not appear overtly distressed by the testing session, per behavioral observation or via self-report to the technician. Rest breaks were offered.   Clinical Decision Making: In considering the patient's current level of functioning, level of presumed impairment, nature of symptoms, emotional and behavioral responses during clinical interview, level of literacy, and observed level of motivation/effort, a battery of tests was selected by Dr. Sima Matas during initial consultation on 06/10/2021. This was communicated to the technician. Communication between the neuropsychologist and technician was ongoing throughout the testing session and changes were made as deemed necessary based on patient performance on testing, technician observations and additional pertinent factors such as those listed above.  Tests Administered: Controlled Oral Word Association Test (COWAT; FAS & Animals)  Wechsler Memory Scale, 4th Edition (WMS-IV); Adult Battery    Results:  WMS-IV  Index Score Summary  Index Sum of Scaled Scores Index Score Percentile Rank 95% Confidence Interval Qualitative Descriptor  Auditory Memory (AMI) 50 115 84 108-120 High Average  Visual Memory (VMI) 31 86 18 81-92 Low Average  Visual Working Memory (VWMI) 16 88 21 82-96 Low Average  Immediate Memory (IMI) 39 98 45 92-104 Average  Delayed Memory (DMI) 42 104 61 97-111 Average      Primary Subtest Scaled Score Summary  Subtest Domain Raw Score Scaled Score Percentile Rank  Logical Memory I AM  29 12 75  Logical Memory II AM 26 11 63  Verbal Paired Associates I AM 43 13 84  Verbal Paired Associates II AM 14 14 91  Designs I VM 56 7 16  Designs II VM 49 8 25  Visual Reproduction I VM 32 7 16  Visual Reproduction II VM 22 9 37  Spatial Addition VWM 11 8 25   Symbol Span VWM 21 8 25       Auditory Memory Process Score Summary  Process Score Raw Score Scaled Score Percentile Rank Cumulative Percentage (Base Rate)  LM II Recognition 24 - - 26-50%  VPA II Recognition 40 - - >75%       Visual Memory Process Score Summary  Process Score Raw Score Scaled Score Percentile Rank Cumulative Percentage (Base Rate)  DE I Content 29 6 9  -  DE I Spatial 15 8 25  -  DE II Content 32 9 37 -  DE II Spatial 11 8 25  -  DE II Recognition 12 - - 10-16%  VR II Recognition 5 - - 17-25%      ABILITY-MEMORY ANALYSIS  Ability Score:  VCI: 96 Date of Testing:  WAIS-IV; WMS-IV 2021/07/09  Predicted Difference Method   Index Predicted WMS-IV Index Score Actual WMS-IV Index Score Difference Critical Value  Significant Difference Y/N Base Rate  Auditory Memory 98 115 -17 8.91 Y 10%  Visual Memory 98 86 12 8.02 Y 20%  Visual Working Memory 98 88 10 11.90 N   Immediate Memory 98 98 0 9.48 N   Delayed Memory 98 104 -6 10.18 N   Statistical significance (critical value) at the .01 level.      COWAT FAS Total = 55 Z = 0.92 Animals Total = 23 Z = 0.20  Feedback to Patient: Sissy Goetzke will return on 10/29/2020 for an interactive feedback session with Dr. Sima Matas at which time her test performances, clinical impressions and treatment recommendations will be reviewed in detail. The patient understands she can contact our office should she require our assistance before this time.  90 minutes spent face-to-face with patient administering standardized tests, 30 minutes spent scoring Environmental education officer). [CPT [id number], [id number]]  Full report to follow.

## 2021-07-30 ENCOUNTER — Other Ambulatory Visit: Payer: Self-pay

## 2021-07-30 ENCOUNTER — Encounter: Payer: Self-pay | Admitting: Psychology

## 2021-07-30 ENCOUNTER — Encounter: Payer: Medicaid Other | Attending: Psychology | Admitting: Psychology

## 2021-07-30 DIAGNOSIS — F419 Anxiety disorder, unspecified: Secondary | ICD-10-CM | POA: Diagnosis present

## 2021-07-30 DIAGNOSIS — R569 Unspecified convulsions: Secondary | ICD-10-CM | POA: Insufficient documentation

## 2021-07-30 DIAGNOSIS — F5101 Primary insomnia: Secondary | ICD-10-CM | POA: Diagnosis not present

## 2021-07-30 DIAGNOSIS — F09 Unspecified mental disorder due to known physiological condition: Secondary | ICD-10-CM | POA: Diagnosis not present

## 2021-07-30 DIAGNOSIS — F32A Depression, unspecified: Secondary | ICD-10-CM | POA: Insufficient documentation

## 2021-07-30 NOTE — Progress Notes (Signed)
Neuropsychological Consultation   Patient:   Stacy Jackson   DOB:   November 14, 1980  MR Number:  [id number]  Location:  [facility] [place], [place] [id number] [place] [address] Dept: [phone]           Date of Service:   06/10/2021  Start Time:   1 PM End Time:   3 PM  Today's visit was an in person visit that was conducted in outpatient clinic office.  The patient myself were present for this.  1 hour 15 minutes was spent in face-to-face clinical interview and the other 45 minutes was spent with records review, report writing and setting up testing protocols.  Provider/Observer:  Ilean Skill, Psy.D.       Clinical Neuropsychologist       Billing Code/Service: 96116/96121  Chief Complaint:    Stacy Jackson is a 40 year old female referred by the patient's PCP Mack Hook, MD for neuropsychological evaluation due to concerns around neurological and cognitive functioning in a setting of potential seizure like activity and increasing weakness etc.  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  The patient has had times of left leg weakness and progressive inability to move her body at times and was worked up in 2021 for possible Ethelene Hal syndrome or recurrence of CIDP.  These were not supported at the time.  Patient has been treated with Keppra at times and reports that she feels that it is helpful for her symptoms and can tell a difference in her functioning if she goes more than 2 days without this medicine.  The patient has been diagnosed with epilepsy and has been worked up for this condition in the past.  She has had a normal EEG previously but  these studies occurred when she was not symptomatic at the time.  The patient also has a history of significant headaches.  The patient reports that she was diagnosed with seizure disorder as far back as 2008 and has had various work-ups through the years.  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  Reason for Service:  [name] is a 40 year old female referred by the patient's PCP Mack Hook, MD for neuropsychological evaluation due to concerns around neurological and cognitive functioning in a setting of potential seizure like activity and increasing weakness etc.  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  The patient has had times of left leg weakness and progressive inability to move her body at times and was worked up in 2021 for possible Ethelene Hal syndrome or recurrence of CIDP.  These were not supported at the time.  Patient has been treated with Keppra at times and reports that she feels that it is helpful for her symptoms and can tell a difference in her functioning if she goes more than 2 days without this medicine.  The patient has been diagnosed  with epilepsy and has been worked up for this condition in the past.  She has had a normal EEG previously but these studies occurred when she was not symptomatic at the time.  The patient also has a history of significant headaches.  The patient reports that she was diagnosed with seizure disorder as far back as 2008 and has had various work-ups through the  years.  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  During the clinical interview today, the patient reports that her first seizure occurred in 2008 and had a work-up at that time.  She was started on Keppra at that time.  She reports that then in 2019 she started to have more seizure-like events with trembling and shaking and being found on the floor.  She had injuries from at least 1 of these events in 2019 where she fell while at church and injured her arm landing on the floor.  Patient reports that her family will describe her is starting to shake in her arms and legs and then her eyes will roll and often fall.  She reports that when she recovers from this that she is very tired and will want to go to sleep afterwards.  No previous EEGs have been able to capture any epileptiform types of discharge.  The patient reports that she has been having progressive weakness is in her legs.  She reports that just after the seizure events that she has difficulty regaining her speech and she has been experiencing increasing word finding difficulties, stutter and difficulties with grammatical structure and her expressive language even when she is not having some of the seizure activities.  The patient reports that she was never very good with her memory but now she is having increased memory difficulties and difficulties recalling even historical events that would be very relevant and important.  She reports that her seizure-like events have been more persistent and frequent over the past 2 years.  The patient describes attention and concentration and memory difficulties and reports that she is driven past her house when she was going home without  realizing it and is been traveling at times where she cannot remember where she is trying to get to.  The patient had an MRI of brain with and without contrast conducted on 01/22/2021.  It can be found in the patient's EMR.  The MRI was interpreted by Ulyses Jarred, MD with the impression of a normal MRI of brain and cervical spine.  There were no indications of acute infarct or mass-effect etc.  Normal white matter signal was noted and normal brain volume.  The patient does have a history of times of depression and anxiety and currently has relevant medications including BuSpar 3 times a day, Effexor once per day, Neurontin for some of her pain symptoms as well as Keppra for her seizure disorder etc.  The patient reports that she has a hard time sleeping and will often be awake until 3 AM to sunrise.  The patient reports that her sleep is at times of significant disturbance and that she will have a hard time falling asleep and if she does go to sleep when she wakes up she will have difficulty going back to sleep.  Patient reports that she tries to go to sleep  between 9 and 11 PM and reports that she can lay there for many hours trying to go back to sleep.  The patient reports that she has a significant variation in her appetite and that sometimes she will not eat enough and other times she is really wanting to eat and tends to overeat.  The patient reports that she is not engaging as many activities that she used to or would like to and has to have others help when she has to lift anything etc.  There is no significant neurological history in the family other than a family history of asked Buerger's/mild autistic spectrum disorder.  The patient reports that she has had orthopedic injuries in her shoulder, back and legs and moves much slower now and has a loss of balance and tends to have a curl of her right hand.    The patient reports that she had 1 significant concussion with loss of consciousness in  2011.  The patient reports that this happened when she had a significant fall and had a loss of consciousness for 10 minutes.  She reports that she was standing in the kitchen and just "passed out and fell".  She reports that this was not like her current seizure events with falls.  The patient reports that she had had a seizure-like event 2-year prior to this event.  Behavioral Observation: Najae Filsaime  presents as a 40 y.o.-year-old Left handed African American Female who appeared her stated age. her dress was Appropriate and she was Well Groomed and her manners were Appropriate to the situation.  her participation was indicative of Appropriate and Redirectable behaviors.  There were physical disabilities noted.  she displayed an appropriate level of cooperation and motivation.     Interactions:    Active Appropriate  Attention:   abnormal and attention span appeared shorter than expected for age  Memory:   abnormal; remote memory intact, recent memory impaired  Visuo-spatial:  not examined  Speech (Volume):  normal  Speech:   normal; some mild word finding issues noted during clinical interview  Thought Process:  Coherent and Relevant  Though Content:  WNL; not suicidal and not homicidal  Orientation:   person, place, time/date, and situation  Judgment:   Fair  Planning:   Fair  Affect:    Anxious  Mood:    Dysphoric  Insight:   Good  Intelligence:   normal  Marital Status/Living: The patient was born and raised in [place] along with 2 siblings.  She currently lives with her daughter and they have been living together for the past 2 years.  The patient is single but was married previously for 4 years.  She has 2 children including a 23 year old son who has been diagnosed with autistic spectrum disorder and is currently going to [place] in [place] and doing well there.  She has a 6 year old daughter that she continues to live in the same house  with.  Current Employment: The patient currently works as an Passenger transport manager at the [employer] and also works sometimes as a Oceanographer at [employer].  The patient has been doing this job for 8 years.  Substance Use:  No concerns of substance abuse are reported.  The patient reports no use of illicit drugs or significant alcohol use.  Education:   The patient reports that she always did very well in school and has attended 2 separate universities for college degrees.  After graduating from [employer]  high school in [place] that she attended [employer] and [employer].  The patient was always quite good and science classes and creative arts. The patient was on the Dean's list in school.  Medical History:   Past Medical History:  Diagnosis Date   Abdominal pain, chronic, right lower quadrant    Cervical cancer ([place]) 2015   Unsuccessful LEEP, then TAH   CIDP (chronic inflammatory demyelinating polyneuropathy) ([place]) 2005-2007   Plasma Exchange and ?replacement of spinal fluid per patient   Complicated migraine 8563   Left arm numbness, states she may have had a "mini-stroke"   Diabetes mellitus without complication ([place])    Diverticulosis 02/15/2017   Factitious disorder imposed on self    Concern for this   GERD (gastroesophageal reflux disease)    with hiatal hernia.  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   Seizures ([place])    Ventricular tachycardia, non-sustained 03/11/2021         Patient Active Problem List   Diagnosis Date Noted   Ventricular tachycardia, non-sustained 03/11/2021   Anxiety and depression 02/17/2021   Nodular thyroid disease 02/17/2021   Hypercholesterolemia 02/17/2021   Weakness of both lower extremities 02/12/2021   Other fatigue 01/23/2021   Generalized weakness 01/22/2021   SOB (shortness of breath) 01/15/2021   Precordial  chest pain 01/15/2021   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 09/02/2020   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   Psoriatic arthritis ([place]) 09/02/2020   Hypertension 04/20/2020   Tachycardia 04/20/2020   Psoriasis 04/20/2020   Palpitations 12/05/2019   Slurred speech 12/05/2019   Weakness on left side of face 12/05/2019   Costochondritis 12/05/2019   Skin rash 09/02/2019   Abdominal pain, chronic, right lower quadrant    Seizure disorder ([place])    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  Family Med/Psych History:  Family History  Problem Relation Age of Onset   Renal cancer Mother    Hypertension Mother    Ulcerative colitis Father    Diabetes Father    Hypertension Father    Diabetes Brother    Hypertension Brother    Asthma Brother    Sickle cell trait Daughter    Eczema Son    Autism spectrum disorder Son    Asthma Brother    Eczema Brother    Breast cancer Maternal Grandmother        diagnosed late 3s and died mid 29s.    Impression/DX:  Zianna Dercole is a 40 year old female referred by the patient's PCP Mack Hook, MD for neuropsychological evaluation due to concerns around neurological and cognitive functioning in a setting of potential seizure like activity and increasing weakness etc.  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  The patient has had times of left leg weakness and progressive inability to move her body at times and was worked up in 2021 for possible Ethelene Hal syndrome or recurrence of CIDP.  These were not supported at the time.  Patient has been treated with Keppra at times and reports that she feels that it is helpful for her symptoms and can tell a difference in her functioning if she goes more than 2 days without this medicine.  The patient has been diagnosed with epilepsy and has been worked up for this condition in the past.  She has had a normal EEG previously but these studies occurred when she was not symptomatic at the time.  The patient also has a history of significant headaches.  The patient reports that she was diagnosed with seizure disorder as far back as 2008 and has had various work-ups through the years.  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  Disposition/Plan:  We have set the patient up for formal neuropsychological evaluation.  We will look at motor functions and expressive language functions with motor functioning test looking for possible lateralization signs.  We will also complete a structured battery of neuropsychological tests with a foundational part of the battery being the Wechsler Adult Intelligence Scale to provide a highly standardized excellently normed assessment of a broad range of cognitive functioning.  Once this is completed we will determine if any other formal testing is needed.  Diagnosis:    Cognitive and  neurobehavioral dysfunction  Anxiety and depression         Electronically Signed   _______________________ Ilean Skill, Psy.D. Clinical Neuropsychologist

## 2021-07-30 NOTE — Progress Notes (Addendum)
Neuropsychological Evaluation   Patient:  Stacy Jackson   DOB: Jul 18, 1981  MR Number: [id number]  Location: [facility] [place], [place] [id number] [place] [id number] Dept: [phone]  Start: 1 PM End: 2 PM  Provider/Observer:     Edgardo Roys PsyD  Chief Complaint:      Chief Complaint  Patient presents with   Anxiety   Depression   Memory Loss   Other    Reason For Service:      Stacy Jackson is a 40 year old female referred by the patient's PCP Stacy Hook, MD for neuropsychological evaluation due to concerns around neurological and cognitive functioning in a setting of potential seizure like activity and increasing weakness etc.  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  Patient has been treated with Keppra at times and reports that she feels that it is helpful for her symptoms and can tell a difference in her functioning if she goes more than 2 days without this medicine.  The patient has been diagnosed with epilepsy and has been worked up for this condition in the past.  She has had a normal EEG previously but these studies occurred when she was not symptomatic at the time.  The patient also has a history of significant headaches.  The patient reports that she was diagnosed with seizure disorder as far back as 2008 and has had various work-ups  through the years.  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  During the clinical interview today, the patient reports that her first seizure occurred in 2008 and had a work-up at that time.  She was started on Keppra at that time.  She reports that in 2019 she started to have more seizure-like events with trembling and shaking and being found on the floor.  She had injuries from at least 1 of these events in 2019 where she fell while at church and injured her arm landing on the floor.  Patient reports that her family will describe her is starting to shake in her arms and legs and then her eyes will roll and often fall.  She reports that when she recovers from this that she is very tired and will want to go to sleep afterwards.  No previous EEGs have been able to capture any epileptiform types of discharge.  The patient reports that she has been having progressive weakness in her legs.  She reports that just after the seizure events that she has difficulty regaining her speech and she has been experiencing increasing word finding difficulties, stutter and difficulties with grammatical structure and her expressive language even when she is not having some of the seizure activities.  The patient reports that she was never very  good with her memory but now she is having increased memory difficulties and difficulties recalling even historical events that would be very relevant and important.  She reports that her seizure-like events have been more persistent and frequent over the past 2 years.  The patient describes attention and concentration and memory difficulties and reports that she has driven past her house when she was going home  without realizing it and is been traveling at times where she cannot remember where she is trying to get to.  The patient had an MRI of brain with and without contrast conducted on 01/22/2021.  It can be found in the patient's EMR.  The MRI was interpreted by Ulyses Jarred, MD with the impression of a normal MRI of brain and cervical spine.  There were no indications of acute infarct or mass-effect etc.  Normal white matter signal was noted and normal brain volume.  The patient does have a history of times of depression and anxiety and currently has relevant medications including BuSpar 3 times a day, Effexor once per day, Neurontin for some of her pain symptoms as well as Keppra for her seizure disorder etc.  The patient reports that she has a hard time sleeping and will often be awake until 3 AM to sunrise.  The patient reports that her sleep is at times quite disturbed and that she will have a hard time falling asleep and if she does go to sleep, when she wakes up she will have difficulty going back to sleep.  Patient reports that she tries to go to sleep between 9 and 11 PM and reports that she can lay there for many hours trying to go back to sleep.  The patient reports that she has a significant variation in her appetite and that sometimes she will not eat enough and other times she is really wanting to eat and tends to overeat.  The patient reports that she is not engaging as many activities that she used to or would like to and has to have others help when she has to lift anything etc.  There is no significant neurological history in the family other than a family history of Asperger's/mild autistic spectrum disorder.  The patient reports that she has had orthopedic injuries in her shoulder, back and legs and moves much slower now and has a loss of balance and tends to have a curl of her right hand.    The patient reports that she had 1 significant concussion with loss of consciousness in 2011.  The  patient reports that this happened when she had a significant fall and had a loss of consciousness for 10 minutes.  She reports that she was standing in the kitchen and just "passed out and fell".  She reports that this was not like her current seizure events with falls.  The patient reports that she had had a seizure-like event 2-year prior to this event.  Tests Administered: Finger Tapping Test (FTT) Grooved Pegboard Hand Dynamometer  Wechsler Adult Intelligence Scale, 4th Edition (WAIS-IV) Controlled Oral Word Association Test (COWAT; FAS & Animals)  Wechsler Memory Scale, 4th Edition (WMS-IV); Adult Battery   Participation Level:   Active  Participation Quality:  Appropriate      Behavioral Observation:  Tracyann Duffell completed 240 minutes of neuropsychological testing with technician, Dina Rich, BA, under the supervision of Ilean Skill, PsyD., Clinical Neuropsychologist. The patient did not appear overtly distressed by the testing session, per behavioral observation or via self-report  to the technician. Rest breaks were offered.   Well Groomed, Alert, and Appropriate.   Test Results:   Initially, an estimation was made as to the patient's historic/premorbid intellectual and cognitive functioning.  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  It is estimated that the patient likely has functioned in the high average range relative to normative population and we will use a conservative estimate as to premorbid intellectual and cognitive functioning to be around 1 standard deviation above normative population with a standard score of 115 as a working starting point.  COWAT FAS Total = 55 Z = 0.92 Animals Total = 23 Z = 0.20  Initially, the patient was administered the control oral Word Association test as  the patient has been reporting she has been experiencing increasing word finding issues and verbal fluency changes.  The patient's performance on both phonetic naming as well as targeted animal naming were within normal limits and there were no indications of objective findings of word finding or fluency issues.  Finger Tapping Test Right Hand: 28 37 39 42 38 41 44 44 44 42    Left Hand: Dominant hand 27 41 44 46 50 44 44 46 46 47    Assessment of motor speed shows that the patient is displaying consistent performance between dominant (left hand) and nondominant hands as far as motor speed.  She did slightly better with her dominant hand as predicted and there was no indication of any lateralization as far as motor speed and dexterity.   Hand Dynamometer Right Hand: 31 29   Left Hand: Dominant hand 35 32   The patient was also given the hand dynamometer test to assess grip strength.  The patient performed better than normative expectations when matched for gender and age.  She exceeded gender specific norms and showed consistent performance between left dominant hand and nondominant hand with dominant hand outperforming nondominant hand.  Grooved Pegboard Right Hand: Time = 1:36.00 # Drops = 1   Left Hand: Dominant hand Time = 1:53.73 # Drops = 2  While the patient showed balanced performance within normative expectations for measures of grip strength and motor speed the patient showed significant weaknesses with regard to fine motor control.  This was most pronounced for her left dominant hand where she actually required more time to complete the task than she did with her right nondominant hand.  She also had 2 drops of the key during the dominant hand performance.  These are impaired scores with greater deficits with regard to left dominant hand noted.  WAIS-IV             Composite Score Summary        Scale Sum of Scaled Scores Composite Score  Percentile Rank 95% Conf. Interval Qualitative Description  Verbal Comprehension 28 VCI 96 39 91-102 Average  Perceptual Reasoning 22 PRI 84 14 79-91 Low Average  Working Memory 15 WMI 86 18 80-94 Low Average  Processing Speed 15 PSI 86 18 79-96 Low Average  Full Scale 80 FSIQ 86 18 82-90 Low Average  General Ability 50 GAI 89 23 84-94 Low Average      The patient was administered the Wechsler Adult Intelligence Scale to provide an assessment and a broad range of various cognitive domains and well standardized highly normed measure.  However, caution should be made as to interpreting score such as her full-scale IQ score etc. as representing her lifelong levels of functioning as the patient is describing changes in cognitive functioning over the past several years and these measures should be looked at as a description of her current levels of functioning rather than premorbid functioning.  The patient produced a full-scale IQ score of 86 which falls at the 18th percentile and is in the low average range relative to a normative population.  This is nearly 30 points below predicted levels based on her education occupational history and clearly suggest significant changes in more than 1 area of cognitive functioning.  We also calculated the patient's general abilities index score which places less emphasis on measures such as auditory encoding and information processing speed.  The patient produced a general abilities index score of 89 which falls at the 23rd percentile and is in the low average range relative to normative population.           Verbal Comprehension Subtests Summary     Subtest Raw Score Scaled Score Percentile Rank Reference Group Scaled Score SEM  Similarities 26 10 50 10 1.04  Vocabulary 41 11 63 12 0.73  Information 9 7 16 8  0.85  (Comprehension) 27 11 63 12 0.99    The patient produced a verbal comprehension index score of 96 which falls at the 39th percentile and is in the  average range.  While this does not depict clear deficits in most areas as they were in the average range it is slightly below predicted levels.  However, the patient had some significant difficulty with recall of information that she likely is known in her lifetime (general fund of information) and is consistent with the patient describing difficulties of recalling past information and past events as efficiently as she has previously.  The patient performed in the average range with regard to verbal reasoning problem-solving, vocabulary knowledge and social judgment comprehension.               Perceptual Reasoning Subtests Summary     Subtest Raw Score Scaled Score Percentile Rank Reference Group Scaled Score SEM  Block Design 28 7 16 6  0.99  Matrix Reasoning 19 10 50 10 0.95  Visual Puzzles 7 5 5 5  1.04  (Figure Weights) 15 10 50 10 0.99  (Picture Completion) 8 6 9 6  1.20    The patient produced a perceptual reasoning index score of 84 which falls at the 14th percentile and is in the low average range of cognitive functioning.  There was significant scatter in subtest performance.  The patient performed in the average range on measures of visual reasoning and visual estimation abilities but showed significant difficulty on measures of visual analysis and organization, visual problem solving and her ability to identify visual anomalies within a gestalt.  Measures that have a primary loading on the right parietal lobe were most impaired whereas measures that also had significant frontal lobe involvement were within normal limits.               Working Electrical engineer Raw Score Scaled Score Percentile Rank Reference Group Scaled Score SEM  Digit Span 29 10 50 10 0.73  Arithmetic 8 5 5 5  0.99  (Letter-Number Seq.) 24 14 91 14 1.12      The patient produced a working memory index score of 86 which falls at the 18th percentile and is in  the low average range relative to a  normative population.  There was considerable variability in subtest performance.  For example on measures looking specifically at pure auditory encoding the patient did quite well and was able to process that information effectively.  However, when mathematical demands are placed and/or more complex processing of that information the patient had significant deficits.  This would suggest that she has difficulty with processing information once it is an active registers.  In general, her primary auditory encoding appears to be fine with specific deficits in manipulating and processing that information.             Processing Speed Subtests Summary     Subtest Raw Score Scaled Score Percentile Rank Reference Group Scaled Score SEM  Symbol Search 19 5 5 5  1.56  Coding 72 10 50 10 1.20  (Cancellation) 19 4 2 3  1.62     The patient produced a processing speed index score of 86 which falls at the 18th percentile and is in the low average range relative to a normative population.  Again, we see significant scatter in subtest performance.  On 2 of the 3 measures the patient performed in the significantly impaired range on measures of visual scanning, visual searching and overall speed of mental operations.  However, on another measure she performed in the average range.  This variability on subtest performance is quite perplexing and suggest factors other than an inability to process information rapidly and performed focus execute abilities are at play here.    WMS-IV           Index Score Summary       Index Sum of Scaled Scores Index Score Percentile Rank 95% Confidence Interval Qualitative Descriptor  Auditory Memory (AMI) 50 115 84 108-120 High Average  Visual Memory (VMI) 31 86 18 81-92 Low Average  Visual Working Memory (VWMI) 16 88 21 82-96 Low Average  Immediate Memory (IMI) 39 98 45 92-104 Average  Delayed Memory (DMI) 42 104 61 97-111 Average    The patient was then administered the  Wechsler Memory Scale-IV to assess memory and learning functions in a very structured and systematic way.  On the Wechsler Adult Intelligence Scale, the patient showed good pure auditory encoding abilities and on visual encoding abilities assessed on the Wechsler memory test she performed less efficiently.  Clearly, auditory encoding is performing much better than visual encoding capacity.  Breaking the patient's memory functions down between auditory versus visual memory the patient did quite well on auditory memory task.  In fact, her performance was very consistent with predictions of historic/premorbid functioning.  The patient produced an auditory memory index score of 115 which ranks at the 84th percentile and is in the high average range relative to a normative population.  This is in sharp contrast to the patient's visual memory functions as she produced a visual memory index score of 86 which falls at the 18th percentile.  Breaking the patient's memory functions down between immediate versus delayed memory the patient produced an immediate memory index score of 98 which falls at the 48th percentile and is in the average range.  She did a little bit better on measures of delayed memory and produced a delayed memory index score of 104 which falls at the 61st percentile.  Most of these variables are consistent with more efficient performance for auditory learning versus visual learning and memory.  The patient also showed quite good performance on cued/recognition formats for auditory learning  which would be expected based on her efficient performance on free recall.  However, she performed much more poorly on recognition and cued recall formats for visual learning and memory.  Overall, the patient shows efficient auditory encoding, auditory storage and organization of newly learned information and ability to retain that information after period of delay.  The patient showed consistent deficits for visual  learning and memory starting with visual encoding weaknesses, difficulty with storage and organization of newly learned information and no significant improvement under cueing/recognition formats for visual information.  This would suggest encoding deficits and storage and organizational deficits are the primary etiological factors for her visual memory deficits.  There is a clear indication of lateralization of memory functions.            Primary Subtest Scaled Score Summary     Subtest Domain Raw Score Scaled Score Percentile Rank  Logical Memory I AM 29 12 75  Logical Memory II AM 26 11 63  Verbal Paired Associates I AM 43 13 84  Verbal Paired Associates II AM 14 14 91  Designs I VM 56 7 16  Designs II VM 49 8 25  Visual Reproduction I VM 32 7 16  Visual Reproduction II VM 22 9 37  Spatial Addition VWM 11 8 25   Symbol Span VWM 21 8 25                 Auditory Memory Process Score Summary      Process Score Raw Score Scaled Score Percentile Rank Cumulative Percentage (Base Rate)  LM II Recognition 24 - - 26-50%  VPA II Recognition 40 - - >75%                   Visual Memory Process Score Summary      Process Score Raw Score Scaled Score Percentile Rank Cumulative Percentage (Base Rate)  DE I Content 29 6 9  -  DE I Spatial 15 8 25  -  DE II Content 32 9 37 -  DE II Spatial 11 8 25  -  DE II Recognition 12 - - 10-16%  VR II Recognition 5 - - 17-25%          ABILITY-MEMORY ANALYSIS   Ability Score:    VCI: 96 Date of Testing:           WAIS-IV; WMS-IV 2021/07/09             Predicted Difference Method    Index Predicted WMS-IV Index Score Actual WMS-IV Index Score Difference Critical Value   Significant Difference Y/N Base Rate  Auditory Memory 98 115 -17 8.91 Y 10%  Visual Memory 98 86 12 8.02 Y 20%  Visual Working Memory 98 88 10 11.90 N    Immediate Memory 98 98 0 9.48 N    Delayed Memory 98 104 -6 10.18 N    Statistical significance (critical value)  at the .01 level.          Summary of Results:   Overall, the results of the objective neuropsychological assessment and cognitive testing show a relatively strong implication of lateralization of deficits.  The patient shows global reduction in overall cognitive functioning with more well-preserved functioning with regard to verbal comprehension skills as well as auditory encoding skills without requirements of processing and manipulation of those active registers.  The patient is showing visual spatial changes primarily around issues related to visual analysis and organization, visual problem-solving and her ability to identify visual anomalies within  a visual gestalt.  These measures have a heavy loading on the right parietal functioning.  On measures of visual reasoning and problem-solving and visual estimation she did much better suggesting better right frontal lobe performance versus right parietal lobe functions.  There is a significant difference between auditory versus visual memory and learning capacity.  The patient shows very good pure auditory encoding capacity with greater difficulties with visual encoding capacity.  Immediate and delayed auditory learning are also quite efficient and she shows excellent ability to store and organize new information on the auditory domain.  However, the patient shows mild deficits with regard to visual encoding and significant deficits in her ability to store and organize new information.  Her visual memory impairments do not improve with recognition or cued recall types formats.  Also, while the patient did well on measures of motor speed and grip strength both the right and left hand she showed significant weaknesses with regard to fine motor control with greater deficits for dominant left hand functioning versus nondominant right hand functioning.  Verbal fluency appeared within normal limits.  Impression/Diagnosis:   Overall, the results of the current  neuropsychological evaluation are consistent with subjective reports that the patient is having related to increasing memory difficulties.  However, there is a very strong indication of lateralization of findings.  The patient consistently showed weaknesses and deficits on measures primarily associated with right parietal and right temporal functions.  For example, on pure motor functions and grip strength there was no indication of lateralization but on measures that require manual dexterity and good sensorimotor functions she had much greater difficulty particularly with dominant left hand function relative to nondominant right hand function.  This is likely due to some potential changes in tactile sensitivity and fine motor control versus motor speed and strength.  The patient shows consistent difficulties and parietal lobe functions with deficits for visual constructional and visual spatial and visual analysis type skills.  She does much better on visual reasoning and problem-solving.  The patient also displays significantly more impairments with regard to visual memory and learning versus auditory memory and learning.  Visual encoding is mildly impaired and visual storage and organization of new information is significantly impaired and not simply related to poor retrieval of information but primary difficulties with encoding, storage and organization of new visual information.  All of her identified cognitive weaknesses and deficits are primarily mediated by right hemisphere functioning in particular right parietal and right temporal lobe functions.  As far as diagnostic considerations, given the fact that the patient has had multiple falls with concussions and has had a number of seizure-like events, the clear pattern of right parietal and temporal lobe dysfunction would suggest some focal areas of primary disruption neurologically.  The patient reports her right arm curling under and having difficulties  with her right arm under seizures which would be different than the pattern on cognitive testing and suggest focal left frontal involvement for her seizures.  There have been no findings of any structural abnormalities on CT/MRIs previously and no EEG data has ever identified seizure activity although they have not been able to capture one of her events during recordings.  The patient has history of anxiety and depression but the pattern of neuropsychological strengths and weaknesses would argue against her cognitive difficulties, both subjective as well as objectively assessed, being primarily functional in nature.  There was a very consistent pattern of right hemisphere dysfunction versus well-maintained left hemisphere function.  This was  identified both with fine motor control measures, visual-spatial and visual constructional measures, and visual memory deficits is consistent findings.  As far as treatment recommendations, the patient should continue to be monitored for her partial complex seizure disorder and ongoing care around that.  The patient has a history of headache, CIDP and type 2 diabetes symptoms that need to continue to be monitored and assessed.  How much of her difficulties are related to potential localized migrainous activity is hard to determine versus localized seizure disorder.  It would be helpful to get a video recording of 1 of these seizure-like events and I did not have an opportunity to talk with her family directly about how they have seen these events involved.  This is a very stressful and challenging status for the patient and she would also likely benefit from ongoing care regarding her anxiety and depressive symptomatology.  The patient continues to take [place] and feels like it is very helpful for her.  She is also continuing to take Effexor for mood and anxiety as well as BuSpar for her anxiety.  These appear to be appropriate psychotropic interventions for these other  symptoms.  The patient has significant disturbance in sleep and this should also be an area that should be looked at.  Improving sleep hygiene will be important and anything that can be done to improve overall sleep patterns could potentially reduce her risk of seizure events and may also have a significant improvement and help with regard to her recurrent headaches.  Diagnosis:    Cognitive and neurobehavioral dysfunction  Seizure ([place])  Primary insomnia  Anxiety and depression   _____________________ Ilean Skill, Psy.D. Clinical Neuropsychologist

## 2021-08-11 ENCOUNTER — Ambulatory Visit: Payer: Medicaid Other | Admitting: Internal Medicine

## 2021-08-23 ENCOUNTER — Other Ambulatory Visit: Payer: Self-pay | Admitting: Internal Medicine

## 2021-09-01 ENCOUNTER — Encounter: Payer: Medicaid Other | Attending: Psychology | Admitting: Psychology

## 2021-09-01 ENCOUNTER — Other Ambulatory Visit: Payer: Self-pay

## 2021-09-01 DIAGNOSIS — F5101 Primary insomnia: Secondary | ICD-10-CM | POA: Insufficient documentation

## 2021-09-01 DIAGNOSIS — R569 Unspecified convulsions: Secondary | ICD-10-CM | POA: Diagnosis present

## 2021-09-01 DIAGNOSIS — F32A Depression, unspecified: Secondary | ICD-10-CM | POA: Diagnosis not present

## 2021-09-01 DIAGNOSIS — F09 Unspecified mental disorder due to known physiological condition: Secondary | ICD-10-CM | POA: Diagnosis present

## 2021-09-01 DIAGNOSIS — F419 Anxiety disorder, unspecified: Secondary | ICD-10-CM | POA: Insufficient documentation

## 2021-09-01 NOTE — Progress Notes (Signed)
09/01/2021 1 PM-2 PM:  Today's visit was an in person visit was conducted in my outpatient clinic office with the patient and myself present.  We reviewed the results of the recent neuropsychological evaluation discussed treatment recommendations and ongoing care.  I do think that the patient could benefit from some individual psychotherapeutic interventions.  I gave her some suggestions around these issues but due to my schedule limitations I would not be able to see her for quite some time.  We are in the process of attempting to hire 2 more neuropsychologist here and I instructed the patient that if she is not found somebody that she felt comfortable with and that time to call back and we may have other opportunities for her here.  We also talked about issues related to nutritional and dietary patterns that could be helpful for her migraine/seizure events as well as sustained physical activity and particular focus on good sleep hygiene patterns.  The patient does have patterns consistent with right parietal dysfunction and right temporal lobe dysfunction.  I have included the summary from the formal neuropsychological evaluation below for convenience and the complete neuropsychological evaluation can be found in the patient's EMR dated 07/30/2021.    Impression/Diagnosis:                     Overall, the results of the current neuropsychological evaluation are consistent with subjective reports that the patient is having related to increasing memory difficulties.  However, there is a very strong indication of lateralization of findings.  The patient consistently showed weaknesses and deficits on measures primarily associated with right parietal and right temporal functions.  For example, on pure motor functions and grip strength there was no indication of lateralization but on measures that require manual dexterity and good sensorimotor functions she had much greater difficulty particularly with dominant  left hand function relative to nondominant right hand function.  This is likely due to some potential changes in tactile sensitivity and fine motor control versus motor speed and strength.  The patient shows consistent difficulties and parietal lobe functions with deficits for visual constructional and visual spatial and visual analysis type skills.  She does much better on visual reasoning and problem-solving.  The patient also displays significantly more impairments with regard to visual memory and learning versus auditory memory and learning.  Visual encoding is mildly impaired and visual storage and organization of new information is significantly impaired and not simply related to poor retrieval of information but primary difficulties with encoding, storage and organization of new visual information.  All of her identified cognitive weaknesses and deficits are primarily mediated by right hemisphere functioning in particular right parietal and right temporal lobe functions.  As far as diagnostic considerations, given the fact that the patient has had multiple falls with concussions and has had a number of seizure-like events, the clear pattern of right parietal and temporal lobe dysfunction would suggest some focal areas of primary disruption neurologically.  The patient reports her right arm curling under and having difficulties with her right arm under seizures which would be different than the pattern on cognitive testing and suggest focal left frontal involvement for her seizures.  There have been no findings of any structural abnormalities on CT/MRIs previously and no EEG data has ever identified seizure activity although they have not been able to capture one of her events during recordings.  The patient has history of anxiety and depression but the pattern of neuropsychological strengths and  weaknesses would argue against her cognitive difficulties, both subjective as well as objectively assessed, being  primarily functional in nature.  There was a very consistent pattern of right hemisphere dysfunction versus well-maintained left hemisphere function.  This was identified both with fine motor control measures, visual-spatial and visual constructional measures, and visual memory deficits is consistent findings.  As far as treatment recommendations, the patient should continue to be monitored for her partial complex seizure disorder and ongoing care around that.  The patient has a history of headache, CIDP and type 2 diabetes symptoms that need to continue to be monitored and assessed.  How much of her difficulties are related to potential localized migrainous activity is hard to determine versus localized seizure disorder.  It would be helpful to get a video recording of 1 of these seizure-like events and I did not have an opportunity to talk with her family directly about how they have seen these events involved.  This is a very stressful and challenging status for the patient and she would also likely benefit from ongoing care regarding her anxiety and depressive symptomatology.  The patient continues to take [place] and feels like it is very helpful for her.  She is also continuing to take Effexor for mood and anxiety as well as BuSpar for her anxiety.  These appear to be appropriate psychotropic interventions for these other symptoms.  The patient has significant disturbance in sleep and this should also be an area that should be looked at.  Improving sleep hygiene will be important and anything that can be done to improve overall sleep patterns could potentially reduce her risk of seizure events and may also have a significant improvement and help with regard to her recurrent headaches.   Diagnosis:                                Cognitive and neurobehavioral dysfunction   Seizure ([place])   Primary insomnia   Anxiety and depression     _____________________ Ilean Skill, Psy.D. Clinical  Neuropsychologist

## 2021-09-22 ENCOUNTER — Other Ambulatory Visit: Payer: Self-pay | Admitting: Internal Medicine

## 2021-10-02 ENCOUNTER — Other Ambulatory Visit (INDEPENDENT_AMBULATORY_CARE_PROVIDER_SITE_OTHER): Payer: Medicaid Other

## 2021-10-02 ENCOUNTER — Other Ambulatory Visit: Payer: Self-pay

## 2021-10-02 DIAGNOSIS — E041 Nontoxic single thyroid nodule: Secondary | ICD-10-CM

## 2021-10-03 LAB — TSH+FREE T4
Free T4: 1.03 ng/dL (ref 0.82–1.77)
TSH: 0.586 u[IU]/mL (ref 0.450–4.500)

## 2021-10-29 ENCOUNTER — Ambulatory Visit: Payer: Medicaid Other | Admitting: Psychology

## 2021-12-02 ENCOUNTER — Other Ambulatory Visit: Payer: Self-pay | Admitting: Internal Medicine

## 2022-01-15 ENCOUNTER — Ambulatory Visit (INDEPENDENT_AMBULATORY_CARE_PROVIDER_SITE_OTHER): Payer: Medicaid Other | Admitting: Podiatry

## 2022-01-15 ENCOUNTER — Ambulatory Visit (INDEPENDENT_AMBULATORY_CARE_PROVIDER_SITE_OTHER): Payer: Medicaid Other

## 2022-01-15 ENCOUNTER — Encounter: Payer: Self-pay | Admitting: Podiatry

## 2022-01-15 DIAGNOSIS — M7752 Other enthesopathy of left foot: Secondary | ICD-10-CM | POA: Diagnosis not present

## 2022-01-15 DIAGNOSIS — M21619 Bunion of unspecified foot: Secondary | ICD-10-CM | POA: Diagnosis not present

## 2022-01-15 DIAGNOSIS — M7751 Other enthesopathy of right foot: Secondary | ICD-10-CM

## 2022-01-15 NOTE — Progress Notes (Addendum)
Subjective:  ? ?Patient ID: Stacy Jackson, female   DOB: 41 y.o.   MRN: [id number]  ? ?HPI ?Patient presents stating that she has continued discomfort around the big toe joint right and it is continuing to make it hard for her to wear shoe gear comfortably.  Stated she had about 1 month of relief with the treatment we did last time with reoccurrence and she like to get this corrected ? ? ?ROS ? ? ?   ?Objective:  ?Physical Exam  ?Neurovascular status intact with patient found to have prominence of the first metatarsal head right with inflammation around the joint surface pain with palpation and redness of the bone itself ? ?   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 ?   ?Plan:  ?H&P reviewed condition discussed conservative surgical intervention and I recommended osteotomy surgery for this given its long-term nature failure to respond.  Patient wants surgery and I allowed her to read consent form going over alternative treatments complications she is willing to accept risk of procedure and after reviewing signed consent form.  Patient understands total recovery can take approximately 6 months scheduled for outpatient surgery encouraged to call questions concerns which may arise ? ?X-rays dated today indicated elevation of the intermetatarsal angle right of approximate 15 degrees 13 on the left with mild elevation of the first metatarsal segment. ?   ? ? ?

## 2022-01-21 ENCOUNTER — Telehealth: Payer: Self-pay

## 2022-01-21 NOTE — Telephone Encounter (Signed)
DOS 01/26/2022 ? ?[place] BUNIONECTOMY RT - [id number] ? ?[employer] MEDICAID ? ? ?NOTIFICATION/PRIOR AUTHORIZATION NUMBER CASE STATUS CASE STATUS REASON PRIMARY CARE PHYSICIAN ?[id number] Closed Case Was Managed And Is Now Complete [place] ?ADVANCE NOTIFY DATE/TIME ADMISSION NOTIFY DATE/TIME ?01/20/2022 12:41 PM CDT - ?COVERAGE STATUS ?OVERALL COVERAGE STATUS ?Covered/Approved ?1-1 CODE DESCRIPTION COVERAGE ?STATUS DECISION DATE ?[facility] ?Coverage determination is reflected for the facility admission and is not a guarantee of payment for ongoing services. ?Covered/Approved 01/20/2022 1 [id number] Correction, hallux valgus (bunionectomy) more Covered/Approved 01/20/2022 ?

## 2022-01-25 ENCOUNTER — Telehealth: Payer: Self-pay | Admitting: *Deleted

## 2022-01-25 MED ORDER — OXYCODONE-ACETAMINOPHEN 10-325 MG PO TABS: 1.0000 | ORAL_TABLET | ORAL | 0 refills | Status: AC | PRN

## 2022-01-25 MED ORDER — ONDANSETRON HCL 4 MG PO TABS: 4.0000 mg | ORAL_TABLET | Freq: Three times a day (TID) | ORAL | 0 refills | Status: AC | PRN

## 2022-01-25 NOTE — Telephone Encounter (Signed)
Assuming you authorized this. That is fine. ?

## 2022-01-25 NOTE — Addendum Note (Signed)
Addended by: Wallene Huh on: 01/25/2022 01:30 PM ? ? Modules accepted: Orders ? ?

## 2022-01-25 NOTE — Telephone Encounter (Signed)
Pharmacy is calling to request a change of medication (percocet,10-325 mg)dosage, too high ( 90 MME), would need to change to 3 times daily which would be it to 45 mme and he can dispense.Please advise. ?Returned the call back to pharmacy per physician is ok to change to 3 times daily for 5 days. ?

## 2022-01-26 ENCOUNTER — Encounter: Payer: Self-pay | Admitting: Podiatry

## 2022-01-26 DIAGNOSIS — M2011 Hallux valgus (acquired), right foot: Secondary | ICD-10-CM | POA: Diagnosis not present

## 2022-01-26 NOTE — Telephone Encounter (Signed)
Pharmacy called and they are out of stock on that medication, should be getting more in this afternoon around 4:00, notified the patient.

## 2022-02-01 ENCOUNTER — Ambulatory Visit (INDEPENDENT_AMBULATORY_CARE_PROVIDER_SITE_OTHER): Payer: Medicaid Other | Admitting: Podiatry

## 2022-02-01 ENCOUNTER — Encounter: Payer: Self-pay | Admitting: Podiatry

## 2022-02-01 ENCOUNTER — Ambulatory Visit (INDEPENDENT_AMBULATORY_CARE_PROVIDER_SITE_OTHER): Payer: Medicaid Other

## 2022-02-01 VITALS — Temp 98.8°F

## 2022-02-01 DIAGNOSIS — Z9889 Other specified postprocedural states: Secondary | ICD-10-CM | POA: Diagnosis not present

## 2022-02-03 NOTE — Progress Notes (Signed)
Subjective:  ? ?Patient ID: Stacy Jackson, female   DOB: 41 y.o.   MRN: [id number]  ? ?HPI ?Patient states doing well with surgery minimal discomfort and able to walk with her boot on ? ? ?ROS ? ? ?   ?Objective:  ?Physical Exam  ?Neurovascular status intact negative Bevelyn Buckles' sign noted wound edges well coapted first metatarsal right incision site healing well range of motion good no crepitus ? ?   ?Assessment:  ?Doing well post osteotomy first metatarsal right foot ? ?   ?Plan:  ?Advised on the continuation of elevation compression immobilization and range of motion exercises.  Reviewed x-rays with patient ? ?X-rays indicate osteotomies healing well fixation in place joint congruence ?   ? ? ?

## 2022-03-01 ENCOUNTER — Encounter: Payer: Self-pay | Admitting: Podiatry

## 2022-03-01 ENCOUNTER — Ambulatory Visit (INDEPENDENT_AMBULATORY_CARE_PROVIDER_SITE_OTHER): Payer: Medicaid Other | Admitting: Podiatry

## 2022-03-01 ENCOUNTER — Ambulatory Visit (INDEPENDENT_AMBULATORY_CARE_PROVIDER_SITE_OTHER): Payer: Medicaid Other

## 2022-03-01 DIAGNOSIS — Z9889 Other specified postprocedural states: Secondary | ICD-10-CM | POA: Diagnosis not present

## 2022-03-01 NOTE — Progress Notes (Signed)
Subjective:   Patient ID: Stacy Jackson, female   DOB: 41 y.o.   MRN: [id number]   HPI Patient states doing great with the right foot very pleased   ROS      Objective:  Physical Exam  Neurovascular status intact negative Bevelyn Buckles' sign noted wound edges coapted well hallux in rectus position good range of motion no pain     Assessment:  Doing well post osteotomy right first metatarsal distal     Plan:  H&P reviewed condition and advised on range of motion exercises and I am discharging with instructions on continued elevation compression as needed  X-rays indicate osteotomies healing well fixation in place joint congruence
